# Patient Record
Sex: Female | Born: 1935 | Race: White | Hispanic: No | Smoking: Never smoker
Health system: Southern US, Community
[De-identification: ages and names within clinical notes are randomized; demographics above are authoritative.]

## PROBLEM LIST (undated history)

## (undated) DIAGNOSIS — K219 Gastro-esophageal reflux disease without esophagitis: Secondary | ICD-10-CM

## (undated) DIAGNOSIS — I10 Essential (primary) hypertension: Secondary | ICD-10-CM

## (undated) DIAGNOSIS — H35033 Hypertensive retinopathy, bilateral: Secondary | ICD-10-CM

## (undated) DIAGNOSIS — H35371 Puckering of macula, right eye: Secondary | ICD-10-CM

## (undated) DIAGNOSIS — E785 Hyperlipidemia, unspecified: Secondary | ICD-10-CM

## (undated) HISTORY — DX: Hypertensive retinopathy, bilateral: H35.033

## (undated) HISTORY — DX: Gastro-esophageal reflux disease without esophagitis: K21.9

## (undated) HISTORY — DX: Essential (primary) hypertension: I10

## (undated) HISTORY — DX: Puckering of macula, right eye: H35.371

## (undated) HISTORY — PX: TONSILLECTOMY: SUR1361

## (undated) HISTORY — DX: Hyperlipidemia, unspecified: E78.5

---

## 1979-02-04 HISTORY — PX: TOTAL ABDOMINAL HYSTERECTOMY: SHX209

## 1999-04-11 ENCOUNTER — Other Ambulatory Visit: Admission: RE | Admit: 1999-04-11 | Discharge: 1999-04-11 | Payer: Self-pay | Admitting: *Deleted

## 2000-02-04 DIAGNOSIS — K219 Gastro-esophageal reflux disease without esophagitis: Secondary | ICD-10-CM

## 2000-02-04 HISTORY — DX: Gastro-esophageal reflux disease without esophagitis: K21.9

## 2000-02-14 ENCOUNTER — Encounter (INDEPENDENT_AMBULATORY_CARE_PROVIDER_SITE_OTHER): Payer: Self-pay | Admitting: Specialist

## 2000-02-14 ENCOUNTER — Other Ambulatory Visit: Admission: RE | Admit: 2000-02-14 | Discharge: 2000-02-14 | Payer: Self-pay | Admitting: Gastroenterology

## 2000-07-04 DIAGNOSIS — E785 Hyperlipidemia, unspecified: Secondary | ICD-10-CM

## 2000-07-04 HISTORY — DX: Hyperlipidemia, unspecified: E78.5

## 2000-12-25 ENCOUNTER — Encounter: Payer: Self-pay | Admitting: *Deleted

## 2000-12-25 ENCOUNTER — Encounter: Admission: RE | Admit: 2000-12-25 | Discharge: 2000-12-25 | Payer: Self-pay | Admitting: *Deleted

## 2001-01-03 DIAGNOSIS — I10 Essential (primary) hypertension: Secondary | ICD-10-CM

## 2001-01-03 HISTORY — DX: Essential (primary) hypertension: I10

## 2002-05-03 ENCOUNTER — Other Ambulatory Visit: Admission: RE | Admit: 2002-05-03 | Discharge: 2002-05-03 | Payer: Self-pay | Admitting: *Deleted

## 2002-05-10 ENCOUNTER — Encounter: Admission: RE | Admit: 2002-05-10 | Discharge: 2002-05-10 | Payer: Self-pay | Admitting: *Deleted

## 2002-05-10 ENCOUNTER — Encounter: Payer: Self-pay | Admitting: *Deleted

## 2002-05-18 ENCOUNTER — Encounter: Payer: Self-pay | Admitting: Family Medicine

## 2002-11-18 ENCOUNTER — Encounter: Payer: Self-pay | Admitting: Family Medicine

## 2002-11-18 LAB — CONVERTED CEMR LAB: TSH: 3.53 microintl units/mL

## 2003-11-30 ENCOUNTER — Ambulatory Visit: Payer: Self-pay | Admitting: Family Medicine

## 2003-11-30 LAB — CONVERTED CEMR LAB
Blood Glucose, Fasting: 87 mg/dL
TSH: 3.21 microintl units/mL

## 2003-12-07 ENCOUNTER — Ambulatory Visit: Payer: Self-pay | Admitting: Family Medicine

## 2004-01-04 ENCOUNTER — Encounter: Admission: RE | Admit: 2004-01-04 | Discharge: 2004-01-04 | Payer: Self-pay | Admitting: Family Medicine

## 2004-08-08 ENCOUNTER — Ambulatory Visit: Payer: Self-pay | Admitting: Family Medicine

## 2004-09-12 ENCOUNTER — Ambulatory Visit: Payer: Self-pay | Admitting: Family Medicine

## 2004-10-17 ENCOUNTER — Ambulatory Visit: Payer: Self-pay | Admitting: Family Medicine

## 2004-10-17 LAB — CONVERTED CEMR LAB: Blood Glucose, Fasting: 90 mg/dL

## 2004-10-18 ENCOUNTER — Ambulatory Visit: Payer: Self-pay | Admitting: Family Medicine

## 2004-11-01 ENCOUNTER — Ambulatory Visit: Payer: Self-pay | Admitting: Family Medicine

## 2005-01-07 ENCOUNTER — Encounter: Admission: RE | Admit: 2005-01-07 | Discharge: 2005-01-07 | Payer: Self-pay | Admitting: Family Medicine

## 2005-03-03 ENCOUNTER — Ambulatory Visit: Payer: Self-pay | Admitting: Family Medicine

## 2005-03-07 ENCOUNTER — Ambulatory Visit: Payer: Self-pay | Admitting: Family Medicine

## 2005-03-07 LAB — CONVERTED CEMR LAB: Blood Glucose, Fasting: 93 mg/dL

## 2005-03-18 ENCOUNTER — Ambulatory Visit: Payer: Self-pay | Admitting: Family Medicine

## 2005-03-21 ENCOUNTER — Ambulatory Visit: Payer: Self-pay

## 2005-04-22 ENCOUNTER — Ambulatory Visit: Payer: Self-pay | Admitting: Family Medicine

## 2005-05-23 ENCOUNTER — Ambulatory Visit: Payer: Self-pay | Admitting: Family Medicine

## 2005-06-17 ENCOUNTER — Ambulatory Visit: Payer: Self-pay | Admitting: Family Medicine

## 2005-07-18 ENCOUNTER — Ambulatory Visit: Payer: Self-pay | Admitting: Family Medicine

## 2005-08-19 ENCOUNTER — Ambulatory Visit: Payer: Self-pay | Admitting: Family Medicine

## 2005-11-21 ENCOUNTER — Ambulatory Visit: Payer: Self-pay | Admitting: Family Medicine

## 2005-11-21 LAB — CONVERTED CEMR LAB: TSH: 2.91 microintl units/mL

## 2005-11-25 ENCOUNTER — Ambulatory Visit: Payer: Self-pay | Admitting: Family Medicine

## 2005-12-30 ENCOUNTER — Ambulatory Visit: Payer: Self-pay | Admitting: Family Medicine

## 2006-01-09 ENCOUNTER — Encounter: Admission: RE | Admit: 2006-01-09 | Discharge: 2006-01-09 | Payer: Self-pay | Admitting: Family Medicine

## 2006-05-28 ENCOUNTER — Ambulatory Visit: Payer: Self-pay | Admitting: Family Medicine

## 2006-09-02 ENCOUNTER — Encounter: Payer: Self-pay | Admitting: Family Medicine

## 2006-09-02 DIAGNOSIS — J309 Allergic rhinitis, unspecified: Secondary | ICD-10-CM | POA: Insufficient documentation

## 2006-09-02 DIAGNOSIS — E785 Hyperlipidemia, unspecified: Secondary | ICD-10-CM | POA: Insufficient documentation

## 2006-09-02 DIAGNOSIS — I10 Essential (primary) hypertension: Secondary | ICD-10-CM

## 2006-09-02 DIAGNOSIS — T7840XA Allergy, unspecified, initial encounter: Secondary | ICD-10-CM

## 2006-09-02 DIAGNOSIS — K219 Gastro-esophageal reflux disease without esophagitis: Secondary | ICD-10-CM | POA: Insufficient documentation

## 2006-09-02 DIAGNOSIS — N951 Menopausal and female climacteric states: Secondary | ICD-10-CM | POA: Insufficient documentation

## 2006-12-01 ENCOUNTER — Ambulatory Visit: Payer: Self-pay | Admitting: Family Medicine

## 2006-12-01 LAB — CONVERTED CEMR LAB
ALT: 21 units/L (ref 0–35)
AST: 20 units/L (ref 0–37)
Alkaline Phosphatase: 60 units/L (ref 39–117)
BUN: 12 mg/dL (ref 6–23)
Bilirubin, Direct: 0.1 mg/dL (ref 0.0–0.3)
CO2: 35 meq/L — ABNORMAL HIGH (ref 19–32)
Chloride: 103 meq/L (ref 96–112)
Cholesterol: 233 mg/dL (ref 0–200)
Direct LDL: 137.4 mg/dL
Potassium: 3.9 meq/L (ref 3.5–5.1)
Total Bilirubin: 0.9 mg/dL (ref 0.3–1.2)
Total Protein: 6.6 g/dL (ref 6.0–8.3)

## 2006-12-03 ENCOUNTER — Ambulatory Visit: Payer: Self-pay | Admitting: Family Medicine

## 2006-12-03 DIAGNOSIS — R7303 Prediabetes: Secondary | ICD-10-CM | POA: Insufficient documentation

## 2006-12-25 ENCOUNTER — Ambulatory Visit: Payer: Self-pay | Admitting: Family Medicine

## 2006-12-25 LAB — FECAL OCCULT BLOOD, GUAIAC: Fecal Occult Blood: NEGATIVE

## 2006-12-28 ENCOUNTER — Encounter (INDEPENDENT_AMBULATORY_CARE_PROVIDER_SITE_OTHER): Payer: Self-pay | Admitting: *Deleted

## 2007-01-12 ENCOUNTER — Encounter: Admission: RE | Admit: 2007-01-12 | Discharge: 2007-01-12 | Payer: Self-pay | Admitting: Family Medicine

## 2007-01-14 ENCOUNTER — Encounter (INDEPENDENT_AMBULATORY_CARE_PROVIDER_SITE_OTHER): Payer: Self-pay | Admitting: *Deleted

## 2007-05-18 ENCOUNTER — Ambulatory Visit: Payer: Self-pay | Admitting: Family Medicine

## 2007-06-22 ENCOUNTER — Encounter: Payer: Self-pay | Admitting: Family Medicine

## 2007-06-23 ENCOUNTER — Encounter: Payer: Self-pay | Admitting: Family Medicine

## 2007-08-19 ENCOUNTER — Ambulatory Visit: Payer: Self-pay | Admitting: Family Medicine

## 2007-11-23 ENCOUNTER — Telehealth: Payer: Self-pay | Admitting: Family Medicine

## 2007-12-09 DIAGNOSIS — M899 Disorder of bone, unspecified: Secondary | ICD-10-CM

## 2007-12-09 DIAGNOSIS — M949 Disorder of cartilage, unspecified: Secondary | ICD-10-CM

## 2007-12-10 ENCOUNTER — Ambulatory Visit: Payer: Self-pay | Admitting: Family Medicine

## 2007-12-12 LAB — CONVERTED CEMR LAB
AST: 20 units/L (ref 0–37)
Alkaline Phosphatase: 62 units/L (ref 39–117)
BUN: 12 mg/dL (ref 6–23)
Basophils Relative: 0.8 % (ref 0.0–3.0)
Bilirubin, Direct: 0.1 mg/dL (ref 0.0–0.3)
CO2: 31 meq/L (ref 19–32)
Cholesterol: 212 mg/dL (ref 0–200)
Direct LDL: 131.1 mg/dL
GFR calc Af Amer: 79 mL/min
GFR calc non Af Amer: 65 mL/min
Glucose, Bld: 91 mg/dL (ref 70–99)
HDL: 58.8 mg/dL (ref >39.0)
Hemoglobin: 13.9 g/dL (ref 12.0–15.0)
Lymphocytes Relative: 28.6 % (ref 12.0–46.0)
MCHC: 34 g/dL (ref 30.0–36.0)
Microalb Creat Ratio: 4.9 mg/g (ref 0.0–30.0)
Microalb, Ur: 0.4 mg/dL (ref 0.0–1.9)
Monocytes Absolute: 0.5 10*3/uL (ref 0.1–1.0)
Monocytes Relative: 8.2 % (ref 3.0–12.0)
Sodium: 140 meq/L (ref 135–145)
Total Protein: 6.9 g/dL (ref 6.0–8.3)
VLDL: 26 mg/dL (ref 0–40)
WBC: 6.1 10*3/uL (ref 4.5–10.5)

## 2007-12-14 ENCOUNTER — Ambulatory Visit: Payer: Self-pay | Admitting: Family Medicine

## 2008-01-13 ENCOUNTER — Encounter: Admission: RE | Admit: 2008-01-13 | Discharge: 2008-01-13 | Payer: Self-pay | Admitting: Family Medicine

## 2008-01-17 ENCOUNTER — Encounter (INDEPENDENT_AMBULATORY_CARE_PROVIDER_SITE_OTHER): Payer: Self-pay | Admitting: *Deleted

## 2008-03-09 ENCOUNTER — Telehealth: Payer: Self-pay | Admitting: Family Medicine

## 2008-04-17 ENCOUNTER — Ambulatory Visit: Payer: Self-pay | Admitting: Family Medicine

## 2008-04-25 ENCOUNTER — Ambulatory Visit: Payer: Self-pay | Admitting: Family Medicine

## 2008-05-08 ENCOUNTER — Telehealth: Payer: Self-pay | Admitting: Family Medicine

## 2008-05-25 ENCOUNTER — Ambulatory Visit: Payer: Self-pay | Admitting: Family Medicine

## 2008-05-30 ENCOUNTER — Ambulatory Visit: Payer: Self-pay | Admitting: Gastroenterology

## 2008-06-07 ENCOUNTER — Ambulatory Visit: Payer: Self-pay | Admitting: Gastroenterology

## 2008-06-22 ENCOUNTER — Ambulatory Visit: Payer: Self-pay | Admitting: Family Medicine

## 2008-06-22 LAB — CONVERTED CEMR LAB
Blood in Urine, dipstick: NEGATIVE
Ketones, urine, test strip: NEGATIVE
Specific Gravity, Urine: 1.01
Urobilinogen, UA: 0.2

## 2008-06-23 ENCOUNTER — Encounter: Payer: Self-pay | Admitting: Family Medicine

## 2008-08-14 ENCOUNTER — Telehealth: Payer: Self-pay | Admitting: Family Medicine

## 2008-10-12 ENCOUNTER — Telehealth (INDEPENDENT_AMBULATORY_CARE_PROVIDER_SITE_OTHER): Payer: Self-pay | Admitting: Internal Medicine

## 2008-12-14 ENCOUNTER — Ambulatory Visit: Payer: Self-pay | Admitting: Family Medicine

## 2008-12-14 LAB — CONVERTED CEMR LAB
ALT: 19 units/L (ref 0–35)
Albumin: 4.1 g/dL (ref 3.5–5.2)
BUN: 14 mg/dL (ref 6–23)
Basophils Absolute: 0.1 10*3/uL (ref 0.0–0.1)
Basophils Relative: 1 % (ref 0.0–3.0)
Bilirubin, Direct: 0 mg/dL (ref 0.0–0.3)
CO2: 32 meq/L (ref 19–32)
Creatinine,U: 77 mg/dL
Eosinophils Relative: 1 % (ref 0.0–5.0)
GFR calc non Af Amer: 64.7 mL/min (ref >60)
HCT: 40.2 % (ref 36.0–46.0)
HDL: 58.8 mg/dL (ref >39.00)
Lymphocytes Relative: 25.9 % (ref 12.0–46.0)
Lymphs Abs: 1.7 10*3/uL (ref 0.7–4.0)
MCV: 94.5 fL (ref 78.0–100.0)
Microalb Creat Ratio: 1.3 mg/g (ref 0.0–30.0)
Monocytes Absolute: 0.5 10*3/uL (ref 0.1–1.0)
Monocytes Relative: 7.3 % (ref 3.0–12.0)
Neutro Abs: 4 10*3/uL (ref 1.4–7.7)
Neutrophils Relative %: 64.8 % (ref 43.0–77.0)
Platelets: 204 10*3/uL (ref 150.0–400.0)
Sodium: 141 meq/L (ref 135–145)
TSH: 2.23 microintl units/mL (ref 0.35–5.50)
Total CHOL/HDL Ratio: 4
Triglycerides: 119 mg/dL (ref 0.0–149.0)
VLDL: 23.8 mg/dL (ref 0.0–40.0)

## 2008-12-19 ENCOUNTER — Ambulatory Visit: Payer: Self-pay | Admitting: Family Medicine

## 2009-01-03 ENCOUNTER — Encounter: Payer: Self-pay | Admitting: Family Medicine

## 2009-01-03 ENCOUNTER — Ambulatory Visit: Payer: Self-pay | Admitting: Internal Medicine

## 2009-01-16 ENCOUNTER — Encounter: Admission: RE | Admit: 2009-01-16 | Discharge: 2009-01-16 | Payer: Self-pay | Admitting: Family Medicine

## 2009-02-12 ENCOUNTER — Telehealth: Payer: Self-pay | Admitting: Family Medicine

## 2009-03-20 ENCOUNTER — Ambulatory Visit: Payer: Self-pay | Admitting: Family Medicine

## 2009-03-20 LAB — CONVERTED CEMR LAB
BUN: 10 mg/dL (ref 6–23)
GFR calc non Af Amer: 64.66 mL/min (ref >60)
Glucose, Bld: 103 mg/dL — ABNORMAL HIGH (ref 70–99)
Sodium: 140 meq/L (ref 135–145)

## 2009-04-04 ENCOUNTER — Ambulatory Visit: Payer: Self-pay | Admitting: Family Medicine

## 2009-04-25 ENCOUNTER — Ambulatory Visit: Payer: Self-pay | Admitting: Family Medicine

## 2009-05-01 ENCOUNTER — Telehealth: Payer: Self-pay | Admitting: Family Medicine

## 2009-06-29 ENCOUNTER — Encounter: Payer: Self-pay | Admitting: Family Medicine

## 2009-09-11 ENCOUNTER — Encounter (INDEPENDENT_AMBULATORY_CARE_PROVIDER_SITE_OTHER): Payer: Self-pay | Admitting: *Deleted

## 2009-11-14 ENCOUNTER — Telehealth (INDEPENDENT_AMBULATORY_CARE_PROVIDER_SITE_OTHER): Payer: Self-pay | Admitting: *Deleted

## 2009-11-15 ENCOUNTER — Ambulatory Visit: Payer: Self-pay | Admitting: Family Medicine

## 2009-11-15 LAB — CONVERTED CEMR LAB
AST: 23 units/L (ref 0–37)
Albumin: 4 g/dL (ref 3.5–5.2)
Alkaline Phosphatase: 65 units/L (ref 39–117)
BUN: 15 mg/dL (ref 6–23)
Bilirubin, Direct: 0.1 mg/dL (ref 0.0–0.3)
CO2: 31 meq/L (ref 19–32)
Calcium: 9.3 mg/dL (ref 8.4–10.5)
Cholesterol: 218 mg/dL — ABNORMAL HIGH (ref 0–200)
Creatinine, Ser: 0.9 mg/dL (ref 0.4–1.2)
Direct LDL: 129.1 mg/dL
Glucose, Bld: 108 mg/dL — ABNORMAL HIGH (ref 70–99)
Microalb Creat Ratio: 0.3 mg/g (ref 0.0–30.0)
Potassium: 3.7 meq/L (ref 3.5–5.1)
Total Bilirubin: 0.6 mg/dL (ref 0.3–1.2)
Triglycerides: 126 mg/dL (ref 0.0–149.0)

## 2009-12-13 ENCOUNTER — Ambulatory Visit: Payer: Self-pay | Admitting: Family Medicine

## 2010-01-17 ENCOUNTER — Encounter
Admission: RE | Admit: 2010-01-17 | Discharge: 2010-01-17 | Payer: Self-pay | Source: Home / Self Care | Attending: Family Medicine | Admitting: Family Medicine

## 2010-01-17 LAB — HM MAMMOGRAPHY: HM Mammogram: NORMAL

## 2010-03-05 NOTE — Progress Notes (Signed)
Summary: ? sinus infection  Phone Note Call from Patient Call back at Home Phone [phone]   Caller: Patient Call For: Dr. Dayton Martes Summary of Call: Pt was seen last week for a sore throat and given pcn.  Now she thinks she has a sinus infection.  Her throat is better but now she has productive cough with yellow mucous, sinus congestion.  No fever.  She thinks she needs a stronger abx called to [facility] [address].  Please advise. Initial call taken by: Lowella Petties CMA,  May 01, 2009 11:05 AM  Follow-up for Phone Call        The penicillin should be enough.  It will take time for the mucous to resolve.  Call back if no improvement in 5 days. Follow-up by: Ruthe Mannan MD,  May 01, 2009 11:06 AM  Additional Follow-up for Phone Call Additional follow up Details #1::        Patient Advised.  Additional Follow-up by: Delilah Shan CMA (AAMA),  May 01, 2009 11:12 AM

## 2010-03-05 NOTE — Assessment & Plan Note (Signed)
Summary: SORE THROAT   Vital Signs:  Patient profile:   77 year old female Height:      66 inches Weight:      161.25 pounds BMI:     26.12 Temp:     98.8 degrees F oral Pulse rate:   76 / minute Pulse rhythm:   regular BP sitting:   142 / 74  (left arm) Cuff size:   regular  Vitals Entered By: Delilah Shan CMA Duncan Dull) (April 25, 2009 12:24 PM) CC: ST.  Needs Rx. for Ambien and HCTZ   History of Present Illness: 77 yo here for two days of sore throat. Fever, Tmax 101. No known sick contacts. No cough, runny nose, congestion, or other symptoms. No difficulty swallowing but does have pain with swallowing.  Current Medications (verified): 1)  Atenolol 50 Mg  Tabs (Atenolol) .Marland Kitchen.. 1 Tablet By Mouth Two Times A Day 2)  Nexium 40 Mg  Cpdr (Esomeprazole Magnesium) .... As Needed 3)  Hydrochlorothiazide 12.5 Mg  Tabs (Hydrochlorothiazide) .Marland Kitchen.. 1 Once Daily 4)  Cozaar 50 Mg Tabs (Losartan Potassium) .... One Tab By Mouth Once Daily 5)  Ambien 10 Mg Tabs (Zolpidem Tartrate) .Marland Kitchen.. 1 At Bedtime If Needed 6)  Penicillin V Potassium 500 Mg Tabs (Penicillin V Potassium) .Marland Kitchen.. 1 Tab By Mouth Three Times A Day X 10 Days  Allergies: 1)  ! Codeine Sulfate (Codeine Sulfate)  Review of Systems      See HPI General:  Complains of chills and fever. ENT:  Complains of sore throat; denies ear discharge, earache, hoarseness, nasal congestion, nosebleeds, and postnasal drainage. Resp:  Denies cough. GI:  Denies diarrhea, nausea, and vomiting.  Physical Exam  General:  Well-developed,well-nourished,in no acute distress; alert,appropriate and cooperative throughout examination Mouth:  pharyngeal erythema, tonsil hypertropied, and white plaque(s).   Lungs:  Normal respiratory effort, chest expands symmetrically. Lungs are clear to auscultation, no crackles or wheezes. Heart:  Normal rate and regular rhythm. S1 and S2 normal without gallop, murmur, click, rub or other extra sounds. Cervical Nodes:   L anterior LN tender.   Psych:  Cognition and judgment appear intact. Alert and cooperative with normal attention span and concentration. No apparent delusions, illusions, hallucinations   Impression & Recommendations:  Problem # 1:  ACUTE PHARYNGITIS (ICD-462) Assessment New Rapid strep negative, however has all cardinal symptoms of strep throat on history and physical exam. Given that there is a possiblity of a false negative, will treat with PCN for cardiac protection. Pt in agreement with plan. Her updated medication list for this problem includes:    Penicillin V Potassium 500 Mg Tabs (Penicillin v potassium) .Marland Kitchen... 1 tab by mouth three times a day x 10 days  Orders: Rapid Strep ([address])  Complete Medication List: 1)  Atenolol 50 Mg Tabs (Atenolol) .Marland Kitchen.. 1 tablet by mouth two times a day 2)  Nexium 40 Mg Cpdr (Esomeprazole magnesium) .... As needed 3)  Hydrochlorothiazide 12.5 Mg Tabs (Hydrochlorothiazide) .Marland Kitchen.. 1 once daily 4)  Cozaar 50 Mg Tabs (Losartan potassium) .... One tab by mouth once daily 5)  Ambien 10 Mg Tabs (Zolpidem tartrate) .Marland Kitchen.. 1 at bedtime if needed 6)  Penicillin V Potassium 500 Mg Tabs (Penicillin v potassium) .Marland Kitchen.. 1 tab by mouth three times a day x 10 days Prescriptions: PENICILLIN V POTASSIUM 500 MG TABS (PENICILLIN V POTASSIUM) 1 tab by mouth three times a day x 10 days  #30 x 0   Entered and Authorized by:   Ruthe Mannan  MD   Signed by:   Ruthe Mannan MD on 04/25/2009   Method used:   Print then Give to Patient   RxID:   [id number] HYDROCHLOROTHIAZIDE 12.5 MG  TABS (HYDROCHLOROTHIAZIDE) 1 once daily  #30 Each x 11   Entered and Authorized by:   Ruthe Mannan MD   Signed by:   Ruthe Mannan MD on 04/25/2009   Method used:   Print then Give to Patient   RxID:   [id number] AMBIEN 10 MG TABS (ZOLPIDEM TARTRATE) 1 at bedtime if needed  #30 x 1   Entered and Authorized by:   Ruthe Mannan MD   Signed by:   Ruthe Mannan MD on 04/25/2009   Method used:   Print  then Give to Patient   RxID:   [id number]   Current Allergies (reviewed today): ! CODEINE SULFATE (CODEINE SULFATE)  Laboratory Results   Date/Time Reported: April 25, 2009 12:49 PM   Other Tests  Rapid Strep: negative

## 2010-03-05 NOTE — Assessment & Plan Note (Signed)
Summary: CPX/DLO   Vital Signs:  Patient profile:   77 year old female Weight:      162.50 pounds Temp:     98.1 degrees F oral Pulse rate:   68 / minute Pulse rhythm:   regular BP sitting:   126 / 76  (left arm) Cuff size:   regular  Vitals Entered By: Sydell Axon LPN (January 11, 2010 1:47 PM) CC: 30 Minute checkup, has had a hysterecomy, had a colonoscopy 05/10 by Dr. Christella Hartigan  Vision Screening:Left eye with correction: 20 / 25 Both eyes with correction: 20 / 25        Vision Entered By: Sydell Axon LPN (2010/01/11 2:02 PM) 25db HL: Left  500 hz: 25db 1000 hz: 25db 2000 hz: 25db 4000 hz: No Response Right  500 hz: 25db 1000 hz: 25db 2000 hz: 25db 4000 hz: No Response    History of Present Illness: Pt here for Comp Exam. She has no complaints and feels well.   Preventive Screening-Counseling & Management  Alcohol-Tobacco     Alcohol drinks/day: 1     Alcohol type: wine with dinner     Smoking Status: never     Passive Smoke Exposure: no  Caffeine-Diet-Exercise     Caffeine use/day: 4     Does Patient Exercise: yes     Type of exercise: weights     Times/week: 4  Problems Prior to Update: 1)  Health Screening  (ICD-V70.0) 2)  Osteopenia  (ICD-733.90) 3)  Special Screening Malig Neoplasms Other Sites  (ICD-V76.49) 4)  Carbohydrate Metabolism Disorder  (ICD-271.9) 5)  Screening Mammogram Nec  (ICD-V76.12) 6)  Allergy  (ICD-995.3) 7)  Postmenopausal Status  (ICD-627.2) 8)  Hypertension  (ICD-401.9) 9)  Hyperlipidemia  (ICD-272.4) 10)  Gerd  (ICD-530.81)  Medications Prior to Update: 1)  Atenolol 50 Mg  Tabs (Atenolol) .Marland Kitchen.. 1 Tablet By Mouth Two Times A Day 2)  Nexium 40 Mg  Cpdr (Esomeprazole Magnesium) .... As Needed 3)  Hydrochlorothiazide 12.5 Mg  Tabs (Hydrochlorothiazide) .Marland Kitchen.. 1 Once Daily 4)  Cozaar 50 Mg Tabs (Losartan Potassium) .... One Tab By Mouth Once Daily 5)  Ambien 10 Mg Tabs (Zolpidem Tartrate) .Marland Kitchen.. 1 At Bedtime If  Needed  Current Medications (verified): 1)  Atenolol 50 Mg  Tabs (Atenolol) .Marland Kitchen.. 1 Tablet By Mouth Two Times A Day 2)  Nexium 40 Mg  Cpdr (Esomeprazole Magnesium) .... As Needed 3)  Hydrochlorothiazide 12.5 Mg  Tabs (Hydrochlorothiazide) .Marland Kitchen.. 1 Once Daily 4)  Cozaar 50 Mg Tabs (Losartan Potassium) .... One Tab By Mouth Once Daily 5)  Ambien 10 Mg Tabs (Zolpidem Tartrate) .Marland Kitchen.. 1 At Bedtime If Needed 6)  Multivitamins  Tabs (Multiple Vitamin) .... Take One By Mouth Daily 7)  Vitamin D 1000 Unit Tabs (Cholecalciferol) .... Take One By Mouth Daily 8)  Vitamin-B Complex  Tabs (B Complex Vitamins) .... Take One By Mouth Daily  Allergies: 1)  ! Codeine Sulfate (Codeine Sulfate)  Past History:  Past Medical History: Last updated: 09/02/2006 GERD: 02/2000 Hyperlipidemia: 07/2000 Hypertension: 01/2001  Family History: Last updated: January 11, 2010 Father: DECEASED 27 YOA CVD/ MI , DIALYSIS , HTN  Mother:  A 50 Broken Hip  HTN, SHINGLES BROTHER A 72  LUNG CANCER ( SMOKER)// ANEURYSM OF AORTA X 2 CV: + FATHER / MI HBP: + SELF, + MOTHER, + FATHER DM: NEGATIVE GOUT/ARTHRITIS: PROSTATE CANCER: + BROTHER LUNG (SMOKER) BREAST/OVARIAN/UTERINE CANCER: + 3 M- AUNTS COLON CANCER:  DEPRESSION: NEGATIVE ETOH/DRUG ABUSE: NEGATIVE OTHER: NEGATIVE  STROKE  Social History: Last updated: 09/02/2006 Marital Status: Married/ REMARRIED X 7 YEARS Children: 1 DAUGHTER Occupation: STUDIO// ARTIST/ TEACHES  Risk Factors: Alcohol Use: 1 (12/13/2009) Caffeine Use: 4 (12/13/2009) Exercise: yes (12/13/2009)  Risk Factors: Smoking Status: never (12/13/2009) Passive Smoke Exposure: no (12/13/2009)  Past Surgical History: TAH  w/ BSO for fibroids (Dr Luciana Axe) 1981 Colonoscopy Ext Hemms  Past Polyps none today (Dr Christella Hartigan) 06/07/2008      5 yrs. DEXA nml 01/03/2009  Family History: Father: DECEASED 40 YOA CVD/ MI , DIALYSIS , HTN  Mother:  A 70 Broken Hip  HTN, SHINGLES BROTHER A 72  LUNG CANCER ( SMOKER)//  ANEURYSM OF AORTA X 2 CV: + FATHER / MI HBP: + SELF, + MOTHER, + FATHER DM: NEGATIVE GOUT/ARTHRITIS: PROSTATE CANCER: + BROTHER LUNG (SMOKER) BREAST/OVARIAN/UTERINE CANCER: + 3 M- AUNTS COLON CANCER:  DEPRESSION: NEGATIVE ETOH/DRUG ABUSE: NEGATIVE OTHER: NEGATIVE STROKE  Review of Systems General:  Denies chills, fatigue, fever, sweats, weakness, and weight loss. Eyes:  Denies blurring, discharge, and eye pain; wears conmtacts, near one, distant the other. ENT:  Denies decreased hearing, ear discharge, earache, and ringing in ears. CV:  Denies chest pain or discomfort, fainting, fatigue, palpitations, shortness of breath with exertion, swelling of feet, and swelling of hands. Resp:  Denies cough, shortness of breath, and wheezing. GI:  Denies abdominal pain, bloody stools, change in bowel habits, constipation, dark tarry stools, diarrhea, indigestion, loss of appetite, nausea, vomiting, vomiting blood, and yellowish skin color. GU:  Denies discharge, dysuria, and nocturia. MS:  Denies joint pain, low back pain, muscle aches, cramps, and stiffness. Derm:  Denies dryness, itching, and rash. Neuro:  Denies numbness, poor balance, tingling, and tremors.  Physical Exam  General:  Well-developed,well-nourished,in no acute distress; alert,appropriate and cooperative throughout examination Head:  Normocephalic and atraumatic without obvious abnormalities. No apparent alopecia or balding. Eyes:  Conjunctiva clear bilaterally.  Ears:  External ear exam shows no significant lesions or deformities.  Otoscopic examination reveals clear canals, tympanic membranes are intact bilaterally without bulging, retraction, inflammation or discharge. Hearing is grossly normal bilaterally. Nose:  External nasal examination shows no deformity or inflammation. Nasal mucosa are pink and moist without lesions or exudates. Imnimal irritation and inflammation. Mouth:  pharyngeal erythema, tonsil hypertropied, and  white plaque(s).   Neck:  No deformities, masses, or tenderness noted. Chest Wall:  No deformities, masses, or tenderness noted. Breasts:  No mass, nodules, thickening, tenderness, bulging, retraction, inflamation, nipple discharge or skin changes noted.   Lungs:  Normal respiratory effort, chest expands symmetrically. Lungs are clear to auscultation, no crackles or wheezes. Heart:  Normal rate and regular rhythm. S1 and S2 normal without gallop, murmur, click, rub or other extra sounds. Abdomen:  Bowel sounds positive,abdomen soft and non-tender without masses, organomegaly or hernias noted. Rectal:  No external abnormalities noted. Normal sphincter tone. No rectal masses or tenderness. G neg. Genitalia:  Bimanual only done Introitus wnl, Uterus and Cervix absent, Adnexa nontender w/o mass, ovaries not felt.  Msk:  No deformity or scoliosis noted of thoracic or lumbar spine.   Pulses:  R and L carotid,radial,femoral,dorsalis pedis and posterior tibial pulses are full and equal bilaterally Extremities:  No clubbing, cyanosis, edema, or deformity noted with normal full range of motion of all joints.   Neurologic:  No cranial nerve deficits noted. Station and gait are normal. Sensory, motor and coordinative functions appear intact. Skin:  Intact without suspicious lesions or rashes Cervical Nodes:  No lymphadenopathy noted  Axillary Nodes:  No palpable lymphadenopathy Inguinal Nodes:  No significant adenopathy Psych:  Cognition and judgment appear intact. Alert and cooperative with normal attention span and concentration. No apparent delusions, illusions, hallucinations   Impression & Recommendations:  Problem # 1:  HEALTH SCREENING (ICD-V70.0)  I have personally reviewed the Medicare Annual Wellness questionnaire and have noted 1.   The patient's medical and social history 2.   Their use of alcohol, tobacco or illicit drugs 3.   Their current medications and supplements 4.   The patient's  functional ability including ADL's, fall risks, home safety risks and hearing or visual             impairment. 5.   Diet and physical activities 6.   Evidence for depression or mood disorders  Orders: Medicare -1st Annual Wellness Visit [phone])  Problem # 2:  OSTEOPENIA (ICD-733.90) Assessment: Unchanged  Stable via DEXA 12/10 which was nml. Cont Vit D repl. Her updated medication list for this problem includes:    Vitamin D 1000 Unit Tabs (Cholecalciferol) .Marland Kitchen... 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  Problem # 4:  HYPERTENSION (ICD-401.9) Assessment: Improved  Stable, cont curr meds. Her updated medication list for this problem includes:    Atenolol 50 Mg Tabs (Atenolol) .Marland Kitchen... 1 tablet by mouth two times a day    Hydrochlorothiazide 12.5 Mg Tabs (Hydrochlorothiazide) .Marland Kitchen... 1 once daily    Cozaar 50 Mg Tabs (Losartan potassium) ..... One tab by mouth once daily  BP today: 126/76 Prior BP: 142/74 (04/25/2009)  Labs Reviewed: K+: 3.7 (11/15/2009) Creat: : 0.9 (11/15/2009)   Chol: 218 (11/15/2009)   HDL: 66.60 (11/15/2009)   LDL: DEL (12/10/2007)   TG: 126.0 (11/15/2009)  Orders: Prescription Created Electronically [phone])  Problem # 5:  HYPERLIPIDEMIA (ICD-272.4) Assessment: Unchanged Adequate.Try to get LDL lower via diet as discussed. Labs Reviewed: SGOT: 23 (11/15/2009)   SGPT: 25 (11/15/2009)   HDL:66.60 (11/15/2009), 58.80 (12/14/2008)  LDL:DEL (12/10/2007), DEL (12/01/2006)  Chol:218 (11/15/2009), 224 (12/14/2008)  Trig:126.0 (11/15/2009), 119.0 (12/14/2008)  Complete Medication List: 1)  Atenolol 50 Mg Tabs (Atenolol) .Marland Kitchen.. 1 tablet by mouth two times a day 2)  Nexium 40 Mg Cpdr (Esomeprazole magnesium) .... As needed 3)  Hydrochlorothiazide 12.5 Mg Tabs (Hydrochlorothiazide) .Marland Kitchen.. 1 once daily 4)  Cozaar  50 Mg Tabs (Losartan potassium) .... One tab by mouth once daily 5)  Ambien 10 Mg Tabs (Zolpidem tartrate) .Marland Kitchen.. 1 at bedtime if needed 6)  Multivitamins Tabs (Multiple vitamin) .... Take one by mouth daily 7)  Vitamin D 1000 Unit Tabs (Cholecalciferol) .... Take one by mouth daily 8)  Vitamin-b Complex Tabs (B complex vitamins) .... Take one by mouth daily  Other Orders: Audiometry [phone]) Vision Screening (91478) Zoster (Shingles) Vaccine Live [phone]) Admin 1st Vaccine (13086)  Patient Instructions: 1)  Zostavax today. 2)  RTC one year, sooner as needed. Prescriptions: COZAAR 50 MG TABS (LOSARTAN POTASSIUM) one tab by mouth once daily  #90 x 3   Entered and Authorized by:   Shaune Leeks MD   Signed by:   Shaune Leeks MD on 12/13/2009   Method used:   Electronically to        [facility] (retail)       [address]       [place]  [place], [place]  [address]       Ph: [phone]       Fax: [phone]   RxID:   [id number] HYDROCHLOROTHIAZIDE 12.5 MG  TABS (HYDROCHLOROTHIAZIDE) 1 once daily  #90 x 3   Entered and Authorized by:   Shaune Leeks MD   Signed by:   Shaune Leeks MD on 12/13/2009   Method used:   Electronically to        [facility] (retail)       [address]       [place], [place]  [address]       Ph: [phone]       Fax: [phone]   RxID:   [id number] ATENOLOL 50 MG  TABS (ATENOLOL) 1 tablet by mouth two times a day  #180 x 3   Entered and Authorized by:   Shaune Leeks MD   Signed by:   Shaune Leeks MD on 12/13/2009   Method used:   Electronically to        [facility] (retail)       [address]       [place], [place]  [address]       Ph: [phone]       Fax: [phone]   RxID:   [id number] HYDROCHLOROTHIAZIDE 12.5 MG  TABS (HYDROCHLOROTHIAZIDE) 1 once daily  #30 Each x 12   Entered and Authorized  by:   Shaune Leeks MD   Signed by:   Shaune Leeks MD on 12/13/2009   Method used:   Electronically to        [facility] (retail)       [address]       [place], [place]  [address]       Ph: [phone]       Fax: [phone]   RxID:   [id number] ATENOLOL 50 MG  TABS (ATENOLOL) 1 tablet by mouth two times a day  #60 Each x 12   Entered and Authorized by:   Shaune Leeks MD   Signed by:   Shaune Leeks MD on 12/13/2009   Method used:   Electronically to        [facility] (retail)       [address]       [place], [place]  [address]       Ph: [phone]       Fax: [phone]   RxID:   [id number] COZAAR 50 MG TABS (LOSARTAN POTASSIUM) one tab by mouth once daily  #30 x 12   Entered and Authorized by:   Shaune Leeks MD   Signed by:   Shaune Leeks MD on 12/13/2009   Method used:   Electronically to        [facility] (retail)       [address]       [place], [place]  [address]       Ph: [phone]       Fax: [phone]   RxID:   [id number]    Orders Added: 1)  Audiometry [92552] 2)  Vision Screening [97353] 3)  Prescription Created Electronically [G8553] 4)  Medicare -  1st Annual Wellness Visit [G0438] 5)  Zoster (Shingles) Vaccine Live [90736] 6)  Admin 1st Vaccine [90471]   Immunizations Administered:  Zostavax # 1:    Vaccine Type: Zostavax    Site: left deltoid    Mfr: Merck    Dose: 0.5 ml    Route: [place]    Given by: Sydell Axon LPN    Exp. Date: 09/21/2010    Lot #: [id number]    VIS given: 11/15/04 given December 13, 2009.   Immunizations Administered:  Zostavax # 1:    Vaccine Type: Zostavax    Site: left deltoid    Mfr: Merck    Dose: 0.5 ml    Route: [place]    Given by: Sydell Axon LPN    Exp. Date: 09/21/2010    Lot #: [id number]    VIS given: 11/15/04 given December 13, 2009.  Current  Allergies (reviewed today): ! CODEINE SULFATE (CODEINE SULFATE)

## 2010-03-05 NOTE — Consult Note (Signed)
Summary: Dr.Kathryn [name],Ophthalmology,Note  Dr.Kathryn [name],Ophthalmology,Note   Imported By: Beau Fanny 07/06/2009 13:42:55  _____________________________________________________________________  External Attachment:    Type:   Image     Comment:   External Document

## 2010-03-05 NOTE — Letter (Signed)
Summary: Nadara Eaton letter  [facility]  [address] [place], [place] [address]   Phone: [phone]  Fax: [phone]       09/11/2009 MRN: [id number]  [facility] [address] [place], [place]  [address]  Dear Ms. [facility] [place], and [place] announce the retirement of Arta Silence, M.D., from full-time practice at the [facility] office effective August 02, 2009 and his plans of returning part-time.  It is important to Dr. Hetty Ely and to our practice that you understand that [facility] has seven physicians in our office for your health care needs.  We will continue to offer the same exceptional care that you have today.    Dr. Hetty Ely has spoken to many of you about his plans for retirement and returning part-time in the fall.   We will continue to work with you through the transition to schedule appointments for you in the office and meet the high standards that [facility] is committed to.   Again, it is with great pleasure that we share the news that Dr. Hetty Ely will return to [facility] in October of 2011 with a reduced schedule.    If you have any questions, or would like to request an appointment with one of our physicians, please call us at [phone] and press the option for Scheduling an appointment.  We take pleasure in providing you with excellent patient care and look forward to seeing you at your next office visit.  Our [facility] Physicians are:  Tillman Abide, M.D. Laurita Quint, M.D. Roxy Manns, M.D. Kerby Nora, M.D. Hannah Beat, M.D. Ruthe Mannan, M.D. We proudly welcomed Raechel Ache, M.D. and Eustaquio Boyden, M.D. to the practice in July/August 2011.  Sincerely,   [facility]

## 2010-03-05 NOTE — Progress Notes (Signed)
----   Converted from flag ---- ---- 11/14/2009 1:22 PM, Shaune Leeks MD wrote: bmet 401.9 hepatic chol prof tsh 272.4 microalb 271.9 vit d 733.90  ---- 11/14/2009 12:15 PM, Mills Koller wrote: This patient is scheduled for CPX with you, I need lab orders with dx, please. Thanks, Terri ------------------------------

## 2010-03-05 NOTE — Progress Notes (Signed)
Summary: Rx Atenolol  Phone Note Refill Request Call back at [phone] Message from:  [facility] on February 12, 2009 11:58 AM  Refills Requested: Medication #1:  ATENOLOL 50 MG  TABS 1 tablet by mouth two times a day   Last Refilled: 01/10/2009 Received a call from the pharmacy regarding patient's Atenolol.  Said they have faxed refill request form to our office twice and received no response.  Gave verbal authorization to refill medication.  #60 with 6 refills.   Method Requested: Telephone to Pharmacy Initial call taken by: Linde Gillis CMA Duncan Dull),  February 12, 2009 11:59 AM

## 2010-03-05 NOTE — Assessment & Plan Note (Signed)
Summary: 3 month follow up bp check/rbh   Vital Signs:  Patient profile:   77 year old female Weight:      164.50 pounds BMI:     26.65 Temp:     97.6 degrees F oral Pulse rate:   64 / minute Pulse rhythm:   regular BP sitting:   132 / 78  (left arm) Cuff size:   regular  Vitals Entered By: Sydell Axon LPN (April 04, 452 10:06 AM) CC: 3 Month follow-up after labs and on BP   History of Present Illness: Pt here for followup of BP after switching from Micardis to generic Cozaar, tolerating well. On ly complaint today is congestion but tolerating.  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 1 Tablet By Mouth Two Times A Day 2)  Nexium 40 Mg  Cpdr (Esomeprazole Magnesium) .... As Needed 3)  Hydrochlorothiazide 12.5 Mg  Tabs (Hydrochlorothiazide) .Marland Kitchen.. 1 Once Daily 4)  Cozaar 50 Mg Tabs (Losartan Potassium) .... One Tab By Mouth Once Daily 5)  Ambien 10 Mg Tabs (Zolpidem Tartrate) .Marland Kitchen.. 1 At Bedtime If Needed  Allergies: 1)  ! Codeine Sulfate (Codeine Sulfate)  Physical Exam  General:  Well-developed,well-nourished,in no acute distress; alert,appropriate and cooperative throughout examination, healthy.. Head:  Normocephalic and atraumatic without obvious abnormalities. No apparent alopecia or balding. Eyes:  Conjunctiva clear bilaterally.  Ears:  External ear exam shows no significant lesions or deformities.  Otoscopic examination reveals clear canals, tympanic membranes are intact bilaterally without bulging, retraction, inflammation or  discharge. Hearing is grossly normal bilaterally. Nose:  External nasal examination shows no deformity or inflammation. Nasal mucosa are pink and moist without lesions or exudates. Imnimal irritation and inflammation. Mouth:  Oral mucosa and oropharynx without lesions or exudates.  Teeth in good repair. Neck:  No deformities, masses, or tenderness noted. Lungs:  Normal respiratory effort, chest expands symmetrically. Lungs are clear to auscultation, no crackles or wheezes. Heart:  Normal rate and regular rhythm. S1 and S2 normal without gallop, murmur, click, rub or other extra sounds.   Impression & Recommendations:  Problem # 1:  HYPERTENSION (ICD-401.9) Assessment Unchanged Adequately controlled on new medication, BUN/Cr and K fine.  Continue. Her updated medication list for this problem includes:    Atenolol 50 Mg Tabs (Atenolol) .Marland Kitchen... 1 tablet by mouth two times a day    Hydrochlorothiazide 12.5 Mg Tabs (Hydrochlorothiazide) .Marland Kitchen... 1 once daily    Cozaar 50 Mg Tabs (Losartan potassium) ..... One tab by mouth once daily  BP today: 132/78 Prior BP: 118/64 (12/19/2008)  Labs Reviewed: K+: 3.8 (03/20/2009) Creat: : 0.9 (03/20/2009)   Chol: 224 (12/14/2008)   HDL: 58.80 (12/14/2008)   LDL: DEL (12/10/2007)   TG: 119.0 (12/14/2008)  Problem # 2:  URI (ICD-465.9) Assessment: New  Tolerating.  Take Guaifenesin by [facility] or [facility] and getting MUCOUS RELIEF EXPECTORANT (400mg ), take 11/2 tabs by mouth AM and NOON. Drink lots of fluids anytime taking Guaifenesin.   Instructed on symptomatic treatment. Call if symptoms persist or worsen.   Complete  Medication List: 1)  Atenolol 50 Mg Tabs (Atenolol) .Marland Kitchen.. 1 tablet by mouth two times a day 2)  Nexium 40 Mg Cpdr (Esomeprazole magnesium) .... As needed 3)  Hydrochlorothiazide 12.5 Mg Tabs (Hydrochlorothiazide) .Marland Kitchen.. 1 once daily 4)  Cozaar 50 Mg Tabs (Losartan potassium) .... One tab by mouth once daily 5)   Ambien 10 Mg Tabs (Zolpidem tartrate) .Marland Kitchen.. 1 at bedtime if needed  Patient Instructions: 1)  ERTC for regular Comp Exam.  Current Allergies (reviewed today): ! CODEINE SULFATE (CODEINE SULFATE)

## 2010-10-29 ENCOUNTER — Telehealth: Payer: Self-pay | Admitting: *Deleted

## 2010-10-29 NOTE — Telephone Encounter (Signed)
Pt has found a lump in right breast.  She usually gets her screening mammogram in December but wants to go for diagnostic as soon as possible.  She goes to the breast center in [place].

## 2010-10-29 NOTE — Telephone Encounter (Signed)
Please hve her come in to be seen. Will order mammo then.

## 2010-10-29 NOTE — Telephone Encounter (Signed)
Left message at home number to call back. 

## 2010-10-30 NOTE — Telephone Encounter (Signed)
Patient notified as instructed by telephone. Appointment scheduled. 

## 2010-10-31 ENCOUNTER — Ambulatory Visit: Payer: Self-pay | Admitting: Family Medicine

## 2010-10-31 ENCOUNTER — Encounter: Payer: Self-pay | Admitting: Family Medicine

## 2010-10-31 ENCOUNTER — Ambulatory Visit (INDEPENDENT_AMBULATORY_CARE_PROVIDER_SITE_OTHER): Payer: Medicare Other | Admitting: Family Medicine

## 2010-10-31 DIAGNOSIS — N63 Unspecified lump in unspecified breast: Secondary | ICD-10-CM

## 2010-10-31 NOTE — Patient Instructions (Signed)
Do breast exam in one month and let me know either way. If feels mass or does not. If mass felt, will refer for diagnostic mammogram. If not felt, will get usual screening mammo in Dec in usual yearly follow up.

## 2010-10-31 NOTE — Progress Notes (Signed)
  Subjective:    Patient ID: BRINDLEY MADARANG, female    DOB: 06/20/1930, 77 y.o.   MRN: [id number]  HPI Pt is a 77 yo WF who found a lump in her breast a few days ago and wanted a diagnostic mammogram. I wanted to see her prior to ordering. She last had a mammogram in Dec and she does self exams when she thinks about it. Her right arm was sore last week and she was checking the area in the shower and thought she felt a small pea sized lump up in the tail of the breast in the upper lateral quadrant of the right breast. She felt no lymph node swelling in the axillary area and the arm is still a little sore today. She lifts weights regularly, had not increased amount of weight or number of reps recently but has backed off some the last few days and the discomfort has improved. She does not feel the lump today.    Review of SystemsNoncontributory except as above.       Objective:   Physical Exam  Constitutional: She appears well-developed and well-nourished. No distress.  Pulmonary/Chest:       Breast exam benign except for some tissue thickening bilat in the respective upper outer quadrants. No discrete masses felt.          Assessment & Plan:

## 2010-10-31 NOTE — Assessment & Plan Note (Signed)
No mass or lump felt today.  Will have her do self exam in one month to reassess. If still finds mass, will get diagnostic mammogram then. If not, will get routine screening mammo in Dec on schedule.

## 2010-12-09 ENCOUNTER — Other Ambulatory Visit: Payer: Self-pay | Admitting: Family Medicine

## 2010-12-09 DIAGNOSIS — E749 Disorder of carbohydrate metabolism, unspecified: Secondary | ICD-10-CM

## 2010-12-09 DIAGNOSIS — I10 Essential (primary) hypertension: Secondary | ICD-10-CM

## 2010-12-09 DIAGNOSIS — E785 Hyperlipidemia, unspecified: Secondary | ICD-10-CM

## 2010-12-12 ENCOUNTER — Other Ambulatory Visit: Payer: Self-pay | Admitting: Family Medicine

## 2010-12-12 ENCOUNTER — Other Ambulatory Visit (INDEPENDENT_AMBULATORY_CARE_PROVIDER_SITE_OTHER): Payer: Medicare Other

## 2010-12-12 DIAGNOSIS — Z1231 Encounter for screening mammogram for malignant neoplasm of breast: Secondary | ICD-10-CM

## 2010-12-12 DIAGNOSIS — E749 Disorder of carbohydrate metabolism, unspecified: Secondary | ICD-10-CM

## 2010-12-12 DIAGNOSIS — E785 Hyperlipidemia, unspecified: Secondary | ICD-10-CM

## 2010-12-12 LAB — HEPATIC FUNCTION PANEL
ALT: 19 U/L (ref 0–35)
AST: 19 U/L (ref 0–37)
Alkaline Phosphatase: 61 U/L (ref 39–117)
Bilirubin, Direct: 0 mg/dL (ref 0.0–0.3)
Total Bilirubin: 0.6 mg/dL (ref 0.3–1.2)

## 2010-12-12 LAB — RENAL FUNCTION PANEL
Albumin: 4.2 g/dL (ref 3.5–5.2)
CO2: 28 mEq/L (ref 19–32)
Calcium: 9.4 mg/dL (ref 8.4–10.5)
Potassium: 3.9 mEq/L (ref 3.5–5.1)
Sodium: 139 mEq/L (ref 135–145)

## 2010-12-12 LAB — LDL CHOLESTEROL, DIRECT: Direct LDL: 135.9 mg/dL

## 2010-12-12 LAB — TSH: TSH: 1.65 u[IU]/mL (ref 0.35–5.50)

## 2010-12-12 LAB — LIPID PANEL
Total CHOL/HDL Ratio: 3
VLDL: 30.4 mg/dL (ref 0.0–40.0)

## 2010-12-19 ENCOUNTER — Ambulatory Visit (INDEPENDENT_AMBULATORY_CARE_PROVIDER_SITE_OTHER): Payer: Medicare Other | Admitting: Family Medicine

## 2010-12-19 ENCOUNTER — Encounter: Payer: Self-pay | Admitting: Family Medicine

## 2010-12-19 DIAGNOSIS — E785 Hyperlipidemia, unspecified: Secondary | ICD-10-CM

## 2010-12-19 DIAGNOSIS — I10 Essential (primary) hypertension: Secondary | ICD-10-CM

## 2010-12-19 DIAGNOSIS — Z0000 Encounter for general adult medical examination without abnormal findings: Secondary | ICD-10-CM

## 2010-12-19 DIAGNOSIS — E749 Disorder of carbohydrate metabolism, unspecified: Secondary | ICD-10-CM

## 2010-12-19 DIAGNOSIS — N951 Menopausal and female climacteric states: Secondary | ICD-10-CM

## 2010-12-19 DIAGNOSIS — K219 Gastro-esophageal reflux disease without esophagitis: Secondary | ICD-10-CM

## 2010-12-19 DIAGNOSIS — T7840XA Allergy, unspecified, initial encounter: Secondary | ICD-10-CM

## 2010-12-19 DIAGNOSIS — N63 Unspecified lump in unspecified breast: Secondary | ICD-10-CM

## 2010-12-19 DIAGNOSIS — M949 Disorder of cartilage, unspecified: Secondary | ICD-10-CM

## 2010-12-19 NOTE — Assessment & Plan Note (Signed)
Adequately controlled. Takes one PPI daily "To be careful."

## 2010-12-19 NOTE — Assessment & Plan Note (Signed)
Some sweats in the early AM. Sounds stable.

## 2010-12-19 NOTE — Progress Notes (Signed)
  Subjective:    Patient ID: Kristine Owens, female    DOB: 04-Sep-1930, 77 y.o.   MRN: [id number]  HPI Pt hnere for Comp Exam. She was seen a few months ago for suspected breast mass which neither of [place] felt when here. She has not felt it since being here. [facility]. She feels well and has no complaints.     Review of Systems  Constitutional: Negative for fever, chills, diaphoresis, fatigue and unexpected weight change.       Has occas sweats.  HENT: Negative for hearing loss, ear pain, rhinorrhea, trouble swallowing and tinnitus.   Eyes: Negative for pain, discharge and visual disturbance.  Respiratory: Negative for cough, shortness of breath and wheezing.        Had a cold a while ago and is still coughing.  Cardiovascular: Negative for chest pain, palpitations and leg swelling.       No Fainting or Fatigue.  Gastrointestinal: Negative for nausea, vomiting, abdominal pain, diarrhea, constipation and blood in stool.       No Heartburn  Genitourinary: Negative for dysuria and frequency.  Musculoskeletal: Negative for myalgias, back pain and arthralgias.  Skin: Negative for rash.       No Itching or Dryness.  Neurological: Negative for tremors and numbness.       No Tingling. No Balance Problems.  Hematological: Negative for adenopathy. Does not bruise/bleed easily.  Psychiatric/Behavioral: Negative for dysphoric mood and agitation.       Objective:   Physical Exam  Constitutional: She is oriented to person, place, and time. She appears well-developed and well-nourished. No distress.  HENT:  Head: Normocephalic and atraumatic.  Left Ear: External ear normal.  Nose: Nose normal.  Mouth/Throat: Oropharynx is clear and moist. No oropharyngeal exudate.  Eyes: Conjunctivae and EOM are normal. Pupils are equal, round, and reactive to light. No scleral icterus.  Neck: Normal range of motion. Neck supple. No thyromegaly present.    Cardiovascular: Normal rate, regular rhythm and normal heart sounds.  Exam reveals no friction rub.   No murmur heard. Pulmonary/Chest: Effort normal and breath sounds normal. No respiratory distress. She has no wheezes. She has no rales.       Breasts NT w/o mass, nipple discharge or axillary adenopathy.  Abdominal: Soft. Bowel sounds are normal. She exhibits no mass. There is no tenderness.  Genitourinary:       Vaginal/rectal not done.  Musculoskeletal: Normal range of motion. She exhibits no edema and no tenderness.  Lymphadenopathy:    She has no cervical adenopathy.  Neurological: She is alert and oriented to person, place, and time. She has normal reflexes.  Skin: Skin is warm and dry. No rash noted. She is not diaphoretic. No erythema.  Psychiatric: She has a normal mood and affect. Her behavior is normal. Judgment and thought content normal.          Assessment & Plan:  HMPE  I have personally reviewed the Medicare Annual Wellness questionnaire and have noted 1. The patient's medical and social history 2. Their use of alcohol, tobacco or illicit drugs 3. Their current medications and supplements 4. The patient's functional ability including ADL's, fall risks, home safety risks and hearing or visual             impairment. 5. Diet and physical activities 6. Evidence for depression or mood disorders  No cognitive defects noted.

## 2010-12-19 NOTE — Assessment & Plan Note (Signed)
Nearly adequate. LDL could be lower. Discussed diet. Declines medication. Lab Results  Component Value Date   CHOL 218* 12/12/2010   HDL 72.20 12/12/2010   LDLDIRECT 135.9 12/12/2010   TRIG 152.0* 12/12/2010   CHOLHDL 3 12/12/2010

## 2010-12-19 NOTE — Assessment & Plan Note (Signed)
None noted today. Has mammo next month, normal screening.

## 2010-12-19 NOTE — Assessment & Plan Note (Signed)
Good control. Cont curr meds. BP Readings from Last 3 Encounters:  12/19/10 122/78  10/31/10 138/76  12/13/09 126/76

## 2010-12-19 NOTE — Patient Instructions (Signed)
Take Guaifenesin (400mg ), take 1 tab by mouth AM. Get GUAIFENESIN by  [facility] and getting MUCOUS RELIEF EXPECTORANT/CONGESTION. DO NOT GET MUCINEX (Timed Release Guaifenesin)

## 2010-12-19 NOTE — Assessment & Plan Note (Signed)
Has mucous in the AM. Suggest Guaif in the AM regularly for a while do help get up.  See instructions.

## 2010-12-19 NOTE — Assessment & Plan Note (Signed)
Stable, no higher. Discussed ongoing diet approach.

## 2010-12-19 NOTE — Assessment & Plan Note (Signed)
IOn Vit D. Had DEXA 2 years ago and was nml.

## 2011-01-02 ENCOUNTER — Ambulatory Visit (INDEPENDENT_AMBULATORY_CARE_PROVIDER_SITE_OTHER): Payer: Medicare Other | Admitting: Family Medicine

## 2011-01-02 ENCOUNTER — Encounter: Payer: Self-pay | Admitting: Family Medicine

## 2011-01-02 DIAGNOSIS — J069 Acute upper respiratory infection, unspecified: Secondary | ICD-10-CM

## 2011-01-02 MED ORDER — BENZONATATE 200 MG PO CAPS: 200.0000 mg | ORAL_CAPSULE | Freq: Three times a day (TID) | ORAL | Status: AC | PRN

## 2011-01-02 NOTE — Patient Instructions (Signed)
Drink plenty of fluids, take tylenol as needed, and gargle with warm salt water for your throat.  This should gradually improve.  Take care.  Let us know if you have other concerns.  Take the tessalon for cough.

## 2011-01-02 NOTE — Progress Notes (Signed)
duration of symptoms: since Saturday night.   Rhinorrhea: some, clear Congestion: yes ear pain: yes, bilateral sore throat:no Cough:yes, some sputum but she has trouble clearing it Myalgias:some prev, better now Likely with fever prev, but not today other concerns: h/o bronchitis Taking mucinex Overall, she isn't worse and she isn't much better  ROS: See HPI.  Otherwise negative.    Meds, vitals, and allergies reviewed.   GEN: nad, alert and oriented HEENT: mucous membranes moist, TM w/o erythema, nasal epithelium injected, OP with cobblestoning, frontal and max sinuses not ttp NECK: supple w/o LA CV: rrr. PULM: ctab, no inc wob ABD: soft, +bs EXT: no edema

## 2011-01-03 ENCOUNTER — Encounter: Payer: Self-pay | Admitting: Family Medicine

## 2011-01-03 ENCOUNTER — Ambulatory Visit: Payer: Medicare Other | Admitting: Family Medicine

## 2011-01-03 DIAGNOSIS — J069 Acute upper respiratory infection, unspecified: Secondary | ICD-10-CM | POA: Insufficient documentation

## 2011-01-03 NOTE — Assessment & Plan Note (Signed)
With cough.  Tessalon, supportive tx.  Likely viral.  Okay for outpatient fu, f/u prn. She agrees.  ddx d/w pt. No indication for abx.

## 2011-01-06 ENCOUNTER — Other Ambulatory Visit: Payer: Self-pay | Admitting: Family Medicine

## 2011-01-21 ENCOUNTER — Ambulatory Visit
Admission: RE | Admit: 2011-01-21 | Discharge: 2011-01-21 | Disposition: A | Discharge status: Home / Self Care | Payer: Medicare Other | Source: Ambulatory Visit | Attending: Family Medicine | Admitting: Family Medicine

## 2011-01-21 DIAGNOSIS — Z1231 Encounter for screening mammogram for malignant neoplasm of breast: Secondary | ICD-10-CM

## 2011-05-28 DIAGNOSIS — H2510 Age-related nuclear cataract, unspecified eye: Secondary | ICD-10-CM | POA: Diagnosis not present

## 2011-05-28 DIAGNOSIS — H40029 Open angle with borderline findings, high risk, unspecified eye: Secondary | ICD-10-CM | POA: Diagnosis not present

## 2011-05-28 DIAGNOSIS — H35379 Puckering of macula, unspecified eye: Secondary | ICD-10-CM | POA: Diagnosis not present

## 2011-05-28 DIAGNOSIS — H35349 Macular cyst, hole, or pseudohole, unspecified eye: Secondary | ICD-10-CM | POA: Diagnosis not present

## 2011-05-28 DIAGNOSIS — H43399 Other vitreous opacities, unspecified eye: Secondary | ICD-10-CM | POA: Diagnosis not present

## 2011-07-31 DIAGNOSIS — H40019 Open angle with borderline findings, low risk, unspecified eye: Secondary | ICD-10-CM | POA: Diagnosis not present

## 2011-10-01 DIAGNOSIS — D235 Other benign neoplasm of skin of trunk: Secondary | ICD-10-CM | POA: Diagnosis not present

## 2011-10-01 DIAGNOSIS — B079 Viral wart, unspecified: Secondary | ICD-10-CM | POA: Diagnosis not present

## 2011-12-10 ENCOUNTER — Other Ambulatory Visit: Payer: Self-pay | Admitting: Family Medicine

## 2011-12-12 ENCOUNTER — Other Ambulatory Visit: Payer: Self-pay | Admitting: Family Medicine

## 2011-12-12 DIAGNOSIS — Z1231 Encounter for screening mammogram for malignant neoplasm of breast: Secondary | ICD-10-CM

## 2011-12-15 ENCOUNTER — Other Ambulatory Visit: Payer: Self-pay | Admitting: Family Medicine

## 2011-12-15 DIAGNOSIS — I10 Essential (primary) hypertension: Secondary | ICD-10-CM

## 2011-12-15 DIAGNOSIS — E785 Hyperlipidemia, unspecified: Secondary | ICD-10-CM

## 2011-12-16 ENCOUNTER — Other Ambulatory Visit (INDEPENDENT_AMBULATORY_CARE_PROVIDER_SITE_OTHER): Payer: Medicare Other

## 2011-12-16 DIAGNOSIS — E785 Hyperlipidemia, unspecified: Secondary | ICD-10-CM | POA: Diagnosis not present

## 2011-12-16 DIAGNOSIS — I10 Essential (primary) hypertension: Secondary | ICD-10-CM

## 2011-12-16 LAB — BASIC METABOLIC PANEL
Chloride: 100 mEq/L (ref 96–112)
GFR: 69.52 mL/min (ref >60.00)
Glucose, Bld: 104 mg/dL — ABNORMAL HIGH (ref 70–99)
Potassium: 3.8 mEq/L (ref 3.5–5.1)
Sodium: 139 mEq/L (ref 135–145)

## 2011-12-16 LAB — LIPID PANEL
HDL: 69.2 mg/dL (ref >39.00)
VLDL: 23.2 mg/dL (ref 0.0–40.0)

## 2011-12-16 LAB — LDL CHOLESTEROL, DIRECT: Direct LDL: 121.3 mg/dL

## 2011-12-23 ENCOUNTER — Encounter: Payer: Self-pay | Admitting: Family Medicine

## 2011-12-23 ENCOUNTER — Ambulatory Visit (INDEPENDENT_AMBULATORY_CARE_PROVIDER_SITE_OTHER): Payer: Medicare Other | Admitting: Family Medicine

## 2011-12-23 VITALS — BP 126/78 | HR 56 | Temp 98.1°F | Ht 66.5 in | Wt 163.2 lb

## 2011-12-23 DIAGNOSIS — E785 Hyperlipidemia, unspecified: Secondary | ICD-10-CM | POA: Diagnosis not present

## 2011-12-23 DIAGNOSIS — I10 Essential (primary) hypertension: Secondary | ICD-10-CM

## 2011-12-23 DIAGNOSIS — Z0000 Encounter for general adult medical examination without abnormal findings: Secondary | ICD-10-CM

## 2011-12-23 DIAGNOSIS — E749 Disorder of carbohydrate metabolism, unspecified: Secondary | ICD-10-CM

## 2011-12-23 DIAGNOSIS — K219 Gastro-esophageal reflux disease without esophagitis: Secondary | ICD-10-CM

## 2011-12-23 DIAGNOSIS — Z23 Encounter for immunization: Secondary | ICD-10-CM

## 2011-12-23 NOTE — Progress Notes (Signed)
Subjective:    Patient ID: Kristine Owens, female    DOB: [birth year], 78 y.o.   MRN: [id number]  HPI CC: medicare wellness visit  No questions or concerns today.  Failed hearing screen today.  notices trouble with hearing.  Not current interested in audiology eval. 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  No sadness No falls in last year.  Preventative: Colon cancer screening - 2010 ([facility]) normal, some ext hemorrhoids.  Given h/o adenomatous polyps, rec rpt in 5 yrs Mammogram - has appt 01/2012.  Last was normal 2012. Cervical cancer screening - s/p hysterectomy Flu shot - done 11/2011 Pneumovax - 2003 Shingles - 2011 Td - today. Advanced directives: would want daughter to be HCPOA.  Married/remarried Lives with husband. 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  Past histories reviewed and updated if relevant as below. Patient Active Problem List  Diagnosis  . CARBOHYDRATE METABOLISM DISORDER  . HYPERLIPIDEMIA  . HYPERTENSION  . GERD  . POSTMENOPAUSAL STATUS  . OSTEOPENIA  . ALLERGY   Past Medical History  Diagnosis Date  . GERD (gastroesophageal reflux disease) 02/2000  . Hyperlipemia 07/2000  . Hypertension 01/2001   Past Surgical History  Procedure Date  . Total abdominal hysterectomy 1981    w/BSO for fibroids (Dr. Luciana Axe)  . Tonsillectomy    History  Substance Use Topics  . Smoking status: Never Smoker   . Smokeless tobacco: Never Used  . Alcohol Use: 0.5 oz/week    1 drink(s) per week     Comment: red wine several times a week   Family History  Problem Relation Age of Onset  . Hypertension Mother   . Heart disease Father     MI, CVD  . Kidney disease  Father     dialysis  . Hypertension Father   . Cancer Brother     lung (smoker)  . Aneurysm Brother     of aorta X 2  . Cancer Maternal Aunt     3 aunts (breast, pancreatic)   Allergies  Allergen Reactions  . Codeine Sulfate     REACTION: NAUSEA   Current Outpatient Prescriptions on File Prior to Visit  Medication Sig Dispense Refill  . atenolol (TENORMIN) 50 MG tablet TAKE ONE TABLET BY MOUTH TWICE DAILY  60 tablet  0  . b complex vitamins tablet Take 1 tablet by mouth daily.        . cholecalciferol (VITAMIN D) 1000 UNITS tablet Take 1,000 Units by mouth daily.        . hydrochlorothiazide (HYDRODIURIL) 12.5 MG tablet Take 12.5 mg by mouth daily.        Marland Kitchen losartan (COZAAR) 50 MG tablet TAKE ONE TABLET BY MOUTH EVERY DAY  30 tablet  0  . Multiple Vitamin (MULTIVITAMIN) tablet Take 1 tablet by mouth daily.        Marland Kitchen omeprazole (PRILOSEC OTC) 20 MG tablet Take 20 mg by mouth as needed.        . [DISCONTINUED] hydrochlorothiazide (MICROZIDE) 12.5 MG capsule TAKE ONE CAPSULE BY MOUTH EVERY DAY  30 capsule  0    Review of Systems  Constitutional: Negative for fever, chills, activity change, appetite change, fatigue and unexpected weight change.  HENT: Negative for hearing loss and neck  pain.   Eyes: Negative for visual disturbance.  Respiratory: Negative for cough, chest tightness, shortness of breath and wheezing.   Cardiovascular: Negative for chest pain, palpitations and leg swelling.  Gastrointestinal: Negative for nausea, vomiting, abdominal pain, diarrhea, constipation, blood in stool and abdominal distention.  Genitourinary: Negative for hematuria and difficulty urinating.  Musculoskeletal: Negative for myalgias and arthralgias.  Skin: Negative for rash.  Neurological: Negative for dizziness, seizures, syncope and headaches.  Hematological: Does not bruise/bleed easily.  Psychiatric/Behavioral: Negative for dysphoric mood. The patient is not nervous/anxious.          Objective:   Physical Exam  Nursing note and vitals reviewed. Constitutional: She is oriented to person, place, and time. She appears well-developed and well-nourished. No distress.  HENT:  Head: Normocephalic and atraumatic.  Right Ear: External ear normal.  Left Ear: External ear normal.  Nose: Nose normal.  Mouth/Throat: Oropharynx is clear and moist. No oropharyngeal exudate.  Eyes: Conjunctivae normal and EOM are normal. Pupils are equal, round, and reactive to light. No scleral icterus.  Neck: Normal range of motion. Neck supple. No thyromegaly present.  Cardiovascular: Normal rate, regular rhythm, normal heart sounds and intact distal pulses.   No murmur heard. Pulses:      Radial pulses are 2+ on the right side, and 2+ on the left side.  Pulmonary/Chest: Effort normal and breath sounds normal. No respiratory distress. She has no wheezes. She has no rales. Right breast exhibits no inverted nipple, no mass, no nipple discharge, no skin change and no tenderness. Left breast exhibits no inverted nipple, no mass, no nipple discharge, no skin change and no tenderness.  Abdominal: Soft. Bowel sounds are normal. She exhibits no distension and no mass. There is no tenderness. There is no rebound and no guarding.  Musculoskeletal: Normal range of motion. She exhibits no edema.  Lymphadenopathy:    She has no cervical adenopathy.    She has no axillary adenopathy.       Right axillary: No lateral adenopathy present.       Left axillary: No lateral adenopathy present.      Right: No supraclavicular adenopathy present.       Left: No supraclavicular adenopathy present.  Neurological: She is alert and oriented to person, place, and time.       CN grossly intact, station and gait intact  Skin: Skin is warm and dry. No rash noted.  Psychiatric: She has a normal mood and affect. Her behavior is normal. Judgment and thought content normal.       Assessment & Plan:

## 2011-12-23 NOTE — Assessment & Plan Note (Addendum)
I have personally reviewed the Medicare Annual Wellness questionnaire and have noted 1. The patient's medical and social history 2. Their use of alcohol, tobacco or illicit drugs 3. Their current medications and supplements 4. The patient's functional ability including ADL's, fall risks, home safety risks and hearing or visual impairment. 5. Diet and physical activity 6. Evidence for depression or mood disorders The patients weight, height, BMI have been recorded in the chart.  Hearing and vision has been addressed. I have made referrals, counseling and provided education to the patient based review of the above and I have provided the pt with a written personalized care plan for preventive services. See scanned questionairre. Advanced directives discussed: packet provided.  Would want daughter to be HCPOA.  Reviewed preventative protocols and updated unless pt declined. Td today. Breast exam benign today.  rec keep mammo appt. Failed hearing, asked her to let us know if desires audiology referral.

## 2011-12-23 NOTE — Assessment & Plan Note (Signed)
Chronic, minimal off meds.  

## 2011-12-23 NOTE — Assessment & Plan Note (Signed)
Discussed avoiding added sugars. 

## 2011-12-23 NOTE — Assessment & Plan Note (Signed)
Chronic, stable The current medical regimen is effective;  continue present plan and medications. BP Readings from Last 3 Encounters:  12/23/11 126/78  01/02/11 134/72  12/19/10 122/78

## 2011-12-23 NOTE — Patient Instructions (Addendum)
Tetanus today. Keep appointment for mammogram. Avoid added sugars and white starches. Good to see you today, call us with questions. Advanced directives packet provided today.

## 2011-12-23 NOTE — Addendum Note (Signed)
Addended by: Josph Macho A on: 12/23/2011 09:45 AM   Modules accepted: Orders

## 2011-12-23 NOTE — Assessment & Plan Note (Signed)
Stable on current PPI. Continue.

## 2012-01-06 ENCOUNTER — Other Ambulatory Visit: Payer: Self-pay | Admitting: Family Medicine

## 2012-01-22 ENCOUNTER — Ambulatory Visit
Admission: RE | Admit: 2012-01-22 | Discharge: 2012-01-22 | Disposition: A | Discharge status: Home / Self Care | Payer: Medicare Other | Source: Ambulatory Visit | Attending: Family Medicine | Admitting: Family Medicine

## 2012-01-22 DIAGNOSIS — Z1231 Encounter for screening mammogram for malignant neoplasm of breast: Secondary | ICD-10-CM

## 2012-01-26 ENCOUNTER — Encounter: Payer: Self-pay | Admitting: *Deleted

## 2012-02-27 ENCOUNTER — Other Ambulatory Visit: Payer: Self-pay | Admitting: *Deleted

## 2012-02-27 MED ORDER — ATENOLOL 50 MG PO TABS: 50.0000 mg | ORAL_TABLET | Freq: Two times a day (BID) | ORAL | Status: DC

## 2012-02-27 MED ORDER — HYDROCHLOROTHIAZIDE 12.5 MG PO TABS: 12.5000 mg | ORAL_TABLET | Freq: Every day | ORAL | Status: DC

## 2012-02-27 MED ORDER — LOSARTAN POTASSIUM 50 MG PO TABS: 50.0000 mg | ORAL_TABLET | Freq: Every day | ORAL | Status: DC

## 2012-02-27 NOTE — Telephone Encounter (Signed)
Sent to new pharmacy, mail order

## 2012-05-10 ENCOUNTER — Ambulatory Visit (INDEPENDENT_AMBULATORY_CARE_PROVIDER_SITE_OTHER): Payer: Medicare Other | Admitting: Family Medicine

## 2012-05-10 ENCOUNTER — Telehealth: Payer: Self-pay | Admitting: Family Medicine

## 2012-05-10 ENCOUNTER — Encounter: Payer: Self-pay | Admitting: Family Medicine

## 2012-05-10 VITALS — BP 188/70 | HR 72 | Temp 97.8°F | Wt 169.0 lb

## 2012-05-10 DIAGNOSIS — I10 Essential (primary) hypertension: Secondary | ICD-10-CM

## 2012-05-10 MED ORDER — HYDROCHLOROTHIAZIDE 25 MG PO TABS: 25.0000 mg | ORAL_TABLET | Freq: Every day | ORAL | Status: DC

## 2012-05-10 MED ORDER — LOSARTAN POTASSIUM 50 MG PO TABS: 50.0000 mg | ORAL_TABLET | Freq: Every day | ORAL | Status: DC

## 2012-05-10 NOTE — Progress Notes (Signed)
  Subjective:    Patient ID: Leanord Owens, female    DOB: Jul 17, 1930, 79 y.o.   MRN: [id number]  HPI CC: lightheadedness  bp at home has been running elevated - has noticed ever since starting mail order from pharmacy bp has been creeping upwards.  Received new HCTZ and losartan tablets. At home readings have ranged from 160-190/70-90s Increased lightheadedness recently last few days.  Normally takes hctz in am, and losartan in pm, atenolol bid.  Brings home cuff today - when checked, R 207/84, HR 61 On manual recheck R 188/70. On repeat - R 180/70, L 170/70  Denies headaches, vision changes, chest pain, tightness, shortness of breath or leg swelling.  BP Readings from Last 3 Encounters:  05/10/12 188/70  12/23/11 126/78  01/02/11 134/72   Wt Readings from Last 3 Encounters:  05/10/12 169 lb (76.658 kg)  12/23/11 163 lb 4 oz (74.05 kg)  01/02/11 161 lb 0.6 oz (73.047 kg)    Review of Systems Per HPI    Objective:   Physical Exam  Nursing note and vitals reviewed. Constitutional: She appears well-developed and well-nourished. No distress.  HENT:  Head: Normocephalic and atraumatic.  Mouth/Throat: Oropharynx is clear and moist. No oropharyngeal exudate.  Eyes: Conjunctivae and EOM are normal. Pupils are equal, round, and reactive to light. No scleral icterus.  Neck: Normal range of motion. Neck supple. Carotid bruit is not present.  Cardiovascular: Normal rate, regular rhythm, normal heart sounds and intact distal pulses.   No murmur heard. Pulmonary/Chest: Effort normal and breath sounds normal. No respiratory distress. She has no wheezes. She has no rales.  Musculoskeletal: She exhibits no edema.  Lymphadenopathy:    She has no cervical adenopathy.  Skin: Skin is warm and dry.  Psychiatric: She has a normal mood and affect.       Assessment & Plan:

## 2012-05-10 NOTE — Patient Instructions (Signed)
Return in 1 month for follow up. Buy new blood pressure cuff. Increase hctz to 25mg  daily (new prescription sent in) Start cozaar (brand name losartan) instead of one you have.

## 2012-05-10 NOTE — Telephone Encounter (Signed)
Will see today.  

## 2012-05-10 NOTE — Assessment & Plan Note (Addendum)
Concerned for ineffective losartan - will change to brand cozaar.   Will also increase hctz to 25mg  daily - sent new dose to pharmacy ,pt to double up on current dose until runs out. I also suggested she buy new bp cuff Pt agrees with plan. rtc 1 month for f/u.

## 2012-05-10 NOTE — Telephone Encounter (Signed)
Patient Information:  Caller Name: Shaketta  Phone: [phone]  Patient: Kristine, Owens  Gender: Female  DOB: 1930-08-30  Age: 79 Years  PCP: Eustaquio Boyden [facility])  Office Follow Up:  Does the office need to follow up with this patient?: No  Instructions For The Office: N/A  RN Note:  pt reports she has taken an extra Atenolol on 4/6 and 4/7  Symptoms  Reason For Call & Symptoms: blood pressure running high.  Pt is worried that since she has been getting medications thru the mail, her bp is higher.  193/92 (this am -05/10/12) and 160/83 (now) Pt denies any headaches but does state she feels light headed  Reviewed Health History In EMR: Yes  Reviewed Medications In EMR: Yes  Reviewed Allergies In EMR: Yes  Reviewed Surgeries / Procedures: Yes  Date of Onset of Symptoms: 05/08/2012  Guideline(s) Used:  High Blood Pressure  Disposition Per Guideline:   See Today in Office  Reason For Disposition Reached:   Patient wants to be seen  Advice Given:  Call Back If:  Headache, blurred vision, difficulty talking, or difficulty walking occurs  You become worse.  Patient Will Follow Care Advice:  YES  Appointment Scheduled:  05/10/2012 15:30:00 Appointment Scheduled Provider:  Eustaquio Boyden [facility])

## 2012-05-12 DIAGNOSIS — H40019 Open angle with borderline findings, low risk, unspecified eye: Secondary | ICD-10-CM | POA: Diagnosis not present

## 2012-05-12 DIAGNOSIS — H35379 Puckering of macula, unspecified eye: Secondary | ICD-10-CM | POA: Diagnosis not present

## 2012-05-12 DIAGNOSIS — H43399 Other vitreous opacities, unspecified eye: Secondary | ICD-10-CM | POA: Diagnosis not present

## 2012-05-12 DIAGNOSIS — H538 Other visual disturbances: Secondary | ICD-10-CM | POA: Diagnosis not present

## 2012-05-12 DIAGNOSIS — H2510 Age-related nuclear cataract, unspecified eye: Secondary | ICD-10-CM | POA: Diagnosis not present

## 2012-10-01 DIAGNOSIS — L819 Disorder of pigmentation, unspecified: Secondary | ICD-10-CM | POA: Diagnosis not present

## 2012-10-01 DIAGNOSIS — L570 Actinic keratosis: Secondary | ICD-10-CM | POA: Diagnosis not present

## 2012-10-01 DIAGNOSIS — D235 Other benign neoplasm of skin of trunk: Secondary | ICD-10-CM | POA: Diagnosis not present

## 2012-12-03 DIAGNOSIS — L570 Actinic keratosis: Secondary | ICD-10-CM | POA: Diagnosis not present

## 2012-12-03 DIAGNOSIS — D485 Neoplasm of uncertain behavior of skin: Secondary | ICD-10-CM | POA: Diagnosis not present

## 2012-12-05 ENCOUNTER — Other Ambulatory Visit: Payer: Self-pay | Admitting: Family Medicine

## 2012-12-05 DIAGNOSIS — I10 Essential (primary) hypertension: Secondary | ICD-10-CM

## 2012-12-07 ENCOUNTER — Other Ambulatory Visit (INDEPENDENT_AMBULATORY_CARE_PROVIDER_SITE_OTHER): Payer: Medicare Other

## 2012-12-07 DIAGNOSIS — I10 Essential (primary) hypertension: Secondary | ICD-10-CM

## 2012-12-07 LAB — BASIC METABOLIC PANEL
CO2: 31 mEq/L (ref 19–32)
Chloride: 98 mEq/L (ref 96–112)
Potassium: 3.6 mEq/L (ref 3.5–5.1)

## 2012-12-07 LAB — TSH: TSH: 3.04 u[IU]/mL (ref 0.35–5.50)

## 2012-12-14 ENCOUNTER — Encounter: Payer: Self-pay | Admitting: Family Medicine

## 2012-12-14 ENCOUNTER — Ambulatory Visit (INDEPENDENT_AMBULATORY_CARE_PROVIDER_SITE_OTHER): Payer: Medicare Other | Admitting: Family Medicine

## 2012-12-14 VITALS — BP 146/78 | HR 60 | Temp 98.3°F | Wt 166.2 lb

## 2012-12-14 DIAGNOSIS — N63 Unspecified lump in unspecified breast: Secondary | ICD-10-CM

## 2012-12-14 DIAGNOSIS — I10 Essential (primary) hypertension: Secondary | ICD-10-CM | POA: Diagnosis not present

## 2012-12-14 DIAGNOSIS — N6310 Unspecified lump in the right breast, unspecified quadrant: Secondary | ICD-10-CM

## 2012-12-14 NOTE — Progress Notes (Signed)
Pre-visit discussion using our clinic review tool. No additional management support is needed unless otherwise documented below in the visit note.  

## 2012-12-14 NOTE — Progress Notes (Signed)
  Subjective:    Patient ID: Kristine Owens, female    DOB: 02-Sep-1930, 79 y.o.   MRN: [id number]  HPI CC: f/u HTN  HTN - bp much better controlled since starting brand cozaar 50mg  daily.  No HA, vision changes, CP/tightness, SOB, leg swelling.   BP Readings from Last 3 Encounters:  12/14/12 146/78  05/10/12 188/70  12/23/11 126/78    No concerns/questions today.  Still occasionally feels lump on right breast.  Mammogram last year WNL.  No change in lump.  No pain at area.  No nipple discharge.  No other swelling or mass.  Past Medical History  Diagnosis Date  . GERD (gastroesophageal reflux disease) 02/2000  . Hyperlipemia 07/2000  . Hypertension 01/2001     Review of Systems Per HPI    Objective:   Physical Exam  Nursing note and vitals reviewed. Constitutional: She appears well-developed and well-nourished. No distress.  HENT:  Mouth/Throat: Oropharynx is clear and moist. No oropharyngeal exudate.  Eyes: Conjunctivae and EOM are normal. Pupils are equal, round, and reactive to light. No scleral icterus.  Neck: No thyromegaly present.  Cardiovascular: Normal rate, regular rhythm, normal heart sounds and intact distal pulses.   No murmur heard. Pulmonary/Chest: Effort normal and breath sounds normal. No respiratory distress. She has no wheezes. She has no rales. Right breast exhibits mass. Right breast exhibits no inverted nipple, no nipple discharge, no skin change and no tenderness. Left breast exhibits no inverted nipple, no mass, no nipple discharge, no skin change and no tenderness. Breasts are symmetrical.  Fullness appreciated R breast at around 10 o clock position about 2cm distal to nipple, nontender, no skin changes  Musculoskeletal: She exhibits no edema.  Lymphadenopathy:       Head (right side): No submandibular, no tonsillar, no preauricular and no posterior auricular adenopathy present.       Head (left side): No submandibular, no tonsillar, no  preauricular and no posterior auricular adenopathy present.    She has no cervical adenopathy.    She has no axillary adenopathy.       Right axillary: No lateral adenopathy present.       Left axillary: No lateral adenopathy present.      Right: No supraclavicular adenopathy present.       Left: No supraclavicular adenopathy present.       Assessment & Plan:

## 2012-12-14 NOTE — Patient Instructions (Signed)
I'm glad blood pressure is looking better - continue meds as up to now. For breast lump - let's go ahead and order more detailed mammogram this year. Good to see you today, call us with questions.

## 2012-12-14 NOTE — Assessment & Plan Note (Signed)
Chronic, stable. Improved control with cozaar - continue this med along with atenolol and hctz 25mg  daily.

## 2012-12-14 NOTE — Assessment & Plan Note (Addendum)
Upcoming screening mammogram next month. Some breast fullness found R lateral breast - deep to breast. Will order diagnostic mammo +/- [place] if needed.

## 2012-12-31 ENCOUNTER — Ambulatory Visit
Admission: RE | Admit: 2012-12-31 | Discharge: 2012-12-31 | Disposition: A | Discharge status: Home / Self Care | Payer: Medicare Other | Source: Ambulatory Visit | Attending: Family Medicine | Admitting: Family Medicine

## 2012-12-31 ENCOUNTER — Other Ambulatory Visit: Payer: Self-pay | Admitting: Family Medicine

## 2012-12-31 DIAGNOSIS — N6310 Unspecified lump in the right breast, unspecified quadrant: Secondary | ICD-10-CM

## 2013-01-04 ENCOUNTER — Other Ambulatory Visit: Payer: Self-pay

## 2013-01-04 ENCOUNTER — Other Ambulatory Visit: Payer: Self-pay | Admitting: Family Medicine

## 2013-01-04 DIAGNOSIS — N6310 Unspecified lump in the right breast, unspecified quadrant: Secondary | ICD-10-CM

## 2013-01-05 ENCOUNTER — Ambulatory Visit
Admission: RE | Admit: 2013-01-05 | Discharge: 2013-01-05 | Disposition: A | Discharge status: Home / Self Care | Payer: Medicare Other | Source: Ambulatory Visit | Attending: Family Medicine | Admitting: Family Medicine

## 2013-01-05 DIAGNOSIS — N6310 Unspecified lump in the right breast, unspecified quadrant: Secondary | ICD-10-CM

## 2013-01-05 DIAGNOSIS — N6459 Other signs and symptoms in breast: Secondary | ICD-10-CM | POA: Diagnosis not present

## 2013-01-06 ENCOUNTER — Encounter: Payer: Self-pay | Admitting: *Deleted

## 2013-01-07 ENCOUNTER — Other Ambulatory Visit: Payer: Self-pay | Admitting: Family Medicine

## 2013-02-03 DIAGNOSIS — H35371 Puckering of macula, right eye: Secondary | ICD-10-CM

## 2013-02-03 HISTORY — DX: Puckering of macula, right eye: H35.371

## 2013-03-21 ENCOUNTER — Other Ambulatory Visit: Payer: Self-pay | Admitting: Family Medicine

## 2013-04-26 ENCOUNTER — Encounter: Payer: Self-pay | Admitting: Gastroenterology

## 2013-05-17 ENCOUNTER — Other Ambulatory Visit: Payer: Self-pay | Admitting: Family Medicine

## 2013-05-18 DIAGNOSIS — H40029 Open angle with borderline findings, high risk, unspecified eye: Secondary | ICD-10-CM | POA: Diagnosis not present

## 2013-05-18 DIAGNOSIS — H43819 Vitreous degeneration, unspecified eye: Secondary | ICD-10-CM | POA: Diagnosis not present

## 2013-05-18 DIAGNOSIS — H35379 Puckering of macula, unspecified eye: Secondary | ICD-10-CM | POA: Diagnosis not present

## 2013-05-18 DIAGNOSIS — H2510 Age-related nuclear cataract, unspecified eye: Secondary | ICD-10-CM | POA: Diagnosis not present

## 2013-05-18 DIAGNOSIS — H25019 Cortical age-related cataract, unspecified eye: Secondary | ICD-10-CM | POA: Diagnosis not present

## 2013-06-02 ENCOUNTER — Encounter: Payer: Self-pay | Admitting: Family Medicine

## 2013-06-14 ENCOUNTER — Other Ambulatory Visit: Payer: Self-pay | Admitting: Family Medicine

## 2013-06-14 ENCOUNTER — Other Ambulatory Visit: Payer: Self-pay

## 2013-06-14 MED ORDER — LOSARTAN POTASSIUM 50 MG PO TABS: ORAL_TABLET | ORAL | Status: DC

## 2013-06-14 NOTE — Telephone Encounter (Signed)
Pt said will take approx 10 days to get losartan from mail order pharmacy and pt request # [phone] to [facility]; advised pt done.

## 2013-06-29 ENCOUNTER — Encounter (INDEPENDENT_AMBULATORY_CARE_PROVIDER_SITE_OTHER): Payer: Medicare Other | Admitting: Ophthalmology

## 2013-06-29 DIAGNOSIS — H43819 Vitreous degeneration, unspecified eye: Secondary | ICD-10-CM

## 2013-06-29 DIAGNOSIS — D3130 Benign neoplasm of unspecified choroid: Secondary | ICD-10-CM

## 2013-06-29 DIAGNOSIS — I10 Essential (primary) hypertension: Secondary | ICD-10-CM | POA: Diagnosis not present

## 2013-06-29 DIAGNOSIS — H35039 Hypertensive retinopathy, unspecified eye: Secondary | ICD-10-CM

## 2013-06-29 DIAGNOSIS — H33309 Unspecified retinal break, unspecified eye: Secondary | ICD-10-CM

## 2013-06-29 DIAGNOSIS — H35379 Puckering of macula, unspecified eye: Secondary | ICD-10-CM

## 2013-06-29 DIAGNOSIS — H2510 Age-related nuclear cataract, unspecified eye: Secondary | ICD-10-CM

## 2013-07-06 ENCOUNTER — Ambulatory Visit (INDEPENDENT_AMBULATORY_CARE_PROVIDER_SITE_OTHER): Payer: Medicare Other | Admitting: Ophthalmology

## 2013-07-06 DIAGNOSIS — H33309 Unspecified retinal break, unspecified eye: Secondary | ICD-10-CM

## 2013-09-28 ENCOUNTER — Other Ambulatory Visit: Payer: Self-pay | Admitting: Family Medicine

## 2013-09-30 DIAGNOSIS — L570 Actinic keratosis: Secondary | ICD-10-CM | POA: Diagnosis not present

## 2013-09-30 DIAGNOSIS — L819 Disorder of pigmentation, unspecified: Secondary | ICD-10-CM | POA: Diagnosis not present

## 2013-09-30 DIAGNOSIS — L821 Other seborrheic keratosis: Secondary | ICD-10-CM | POA: Diagnosis not present

## 2013-09-30 DIAGNOSIS — D235 Other benign neoplasm of skin of trunk: Secondary | ICD-10-CM | POA: Diagnosis not present

## 2013-10-28 ENCOUNTER — Other Ambulatory Visit: Payer: Self-pay | Admitting: Family Medicine

## 2013-11-01 DIAGNOSIS — H40029 Open angle with borderline findings, high risk, unspecified eye: Secondary | ICD-10-CM | POA: Diagnosis not present

## 2013-11-08 ENCOUNTER — Ambulatory Visit (INDEPENDENT_AMBULATORY_CARE_PROVIDER_SITE_OTHER): Payer: Medicare Other | Admitting: Ophthalmology

## 2013-11-08 DIAGNOSIS — H33301 Unspecified retinal break, right eye: Secondary | ICD-10-CM

## 2013-11-08 DIAGNOSIS — H35033 Hypertensive retinopathy, bilateral: Secondary | ICD-10-CM

## 2013-11-08 DIAGNOSIS — H35371 Puckering of macula, right eye: Secondary | ICD-10-CM

## 2013-11-08 DIAGNOSIS — H43813 Vitreous degeneration, bilateral: Secondary | ICD-10-CM

## 2013-11-08 DIAGNOSIS — D3132 Benign neoplasm of left choroid: Secondary | ICD-10-CM

## 2013-11-08 DIAGNOSIS — H2513 Age-related nuclear cataract, bilateral: Secondary | ICD-10-CM

## 2013-11-29 ENCOUNTER — Other Ambulatory Visit: Payer: Self-pay

## 2013-11-29 ENCOUNTER — Other Ambulatory Visit: Payer: Self-pay | Admitting: Family Medicine

## 2013-11-29 DIAGNOSIS — Z1231 Encounter for screening mammogram for malignant neoplasm of breast: Secondary | ICD-10-CM

## 2013-12-07 DIAGNOSIS — L820 Inflamed seborrheic keratosis: Secondary | ICD-10-CM | POA: Diagnosis not present

## 2013-12-07 DIAGNOSIS — L570 Actinic keratosis: Secondary | ICD-10-CM | POA: Diagnosis not present

## 2013-12-07 DIAGNOSIS — L821 Other seborrheic keratosis: Secondary | ICD-10-CM | POA: Diagnosis not present

## 2013-12-16 ENCOUNTER — Other Ambulatory Visit: Payer: Self-pay | Admitting: Family Medicine

## 2013-12-16 ENCOUNTER — Other Ambulatory Visit (INDEPENDENT_AMBULATORY_CARE_PROVIDER_SITE_OTHER): Payer: Medicare Other

## 2013-12-16 DIAGNOSIS — E785 Hyperlipidemia, unspecified: Secondary | ICD-10-CM | POA: Diagnosis not present

## 2013-12-16 DIAGNOSIS — I10 Essential (primary) hypertension: Secondary | ICD-10-CM

## 2013-12-16 LAB — BASIC METABOLIC PANEL
BUN: 16 mg/dL (ref 6–23)
CALCIUM: 9.4 mg/dL (ref 8.4–10.5)
CHLORIDE: 99 meq/L (ref 96–112)
CO2: 30 mEq/L (ref 19–32)
Creatinine, Ser: 0.8 mg/dL (ref 0.4–1.2)
GFR: 73.17 mL/min (ref >60.00)
Glucose, Bld: 123 mg/dL — ABNORMAL HIGH (ref 70–99)
Potassium: 3.3 mEq/L — ABNORMAL LOW (ref 3.5–5.1)
SODIUM: 136 meq/L (ref 135–145)

## 2013-12-16 LAB — TSH: TSH: 3.06 u[IU]/mL (ref 0.35–4.50)

## 2013-12-16 LAB — LIPID PANEL
CHOL/HDL RATIO: 4
CHOLESTEROL: 219 mg/dL — AB (ref 0–200)
HDL: 60.8 mg/dL (ref >39.00)
LDL Cholesterol: 137 mg/dL — ABNORMAL HIGH (ref 0–99)
NonHDL: 158.2
TRIGLYCERIDES: 106 mg/dL (ref 0.0–149.0)
VLDL: 21.2 mg/dL (ref 0.0–40.0)

## 2013-12-22 ENCOUNTER — Telehealth: Payer: Self-pay | Admitting: Family Medicine

## 2013-12-22 NOTE — Telephone Encounter (Signed)
emmi emailed °

## 2013-12-23 ENCOUNTER — Encounter: Payer: Self-pay | Admitting: Family Medicine

## 2013-12-23 ENCOUNTER — Ambulatory Visit (INDEPENDENT_AMBULATORY_CARE_PROVIDER_SITE_OTHER): Payer: Medicare Other | Admitting: Family Medicine

## 2013-12-23 VITALS — BP 124/70 | HR 57 | Temp 98.1°F | Ht 65.5 in | Wt 163.0 lb

## 2013-12-23 DIAGNOSIS — Z7189 Other specified counseling: Secondary | ICD-10-CM | POA: Insufficient documentation

## 2013-12-23 DIAGNOSIS — Z23 Encounter for immunization: Secondary | ICD-10-CM

## 2013-12-23 DIAGNOSIS — E785 Hyperlipidemia, unspecified: Secondary | ICD-10-CM

## 2013-12-23 DIAGNOSIS — Z0000 Encounter for general adult medical examination without abnormal findings: Secondary | ICD-10-CM

## 2013-12-23 DIAGNOSIS — N6310 Unspecified lump in the right breast, unspecified quadrant: Secondary | ICD-10-CM

## 2013-12-23 DIAGNOSIS — R739 Hyperglycemia, unspecified: Secondary | ICD-10-CM

## 2013-12-23 DIAGNOSIS — M899 Disorder of bone, unspecified: Secondary | ICD-10-CM

## 2013-12-23 DIAGNOSIS — M949 Disorder of cartilage, unspecified: Secondary | ICD-10-CM

## 2013-12-23 DIAGNOSIS — I10 Essential (primary) hypertension: Secondary | ICD-10-CM

## 2013-12-23 MED ORDER — ATENOLOL 50 MG PO TABS: ORAL_TABLET | ORAL | Status: DC

## 2013-12-23 MED ORDER — LOSARTAN POTASSIUM 50 MG PO TABS: 50.0000 mg | ORAL_TABLET | Freq: Every day | ORAL | Status: DC

## 2013-12-23 MED ORDER — HYDROCHLOROTHIAZIDE 12.5 MG PO TABS: 12.5000 mg | ORAL_TABLET | Freq: Every day | ORAL | Status: DC

## 2013-12-23 MED ORDER — HYDROCHLOROTHIAZIDE 25 MG PO TABS: 25.0000 mg | ORAL_TABLET | Freq: Every day | ORAL | Status: DC

## 2013-12-23 NOTE — Assessment & Plan Note (Signed)
DEXA 2010 WNL. Will resolve problem

## 2013-12-23 NOTE — Progress Notes (Signed)
BP 124/70 mmHg  Pulse 57  Temp(Src) 98.1 F (36.7 C) (Oral)  Ht 5' 5.5" (1.664 m)  Wt 163 lb (73.936 kg)  BMI 26.70 kg/m2  SpO2 98%   CC: medicare wellness  Subjective:    Patient ID: Kristine Owens, female    DOB: 1930-02-18, 80 y.o.   MRN: [id number]  HPI: Kristine Owens is a 80 y.o. female presenting on 12/23/2013 for Annual Exam   Failed hearing screen today.  Recently saw eye doctor.  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 No sadness No falls in last year.  Preventative: Colon cancer screening - 2010 Ardis Hughs) normal, some ext hemorrhoids. Given h/o adenomatous polyps, rec rpt in 5 yrs.  Mammogram - normal 01/2013 (dx and US done for mass but normal). Has f/u scheduled 01/12/2014. Cervical cancer screening - s/p hysterectomy. Aged out. DEXA - normal 2010.  Flu shot - today Pneumovax - 2003. prevnar today Shingles - 2011 Td - 2013 Advanced directives: thinks wants daughter be HCPOA. Has this at home and will bring [place] a copy.  Married/remarried Lives with husband. 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  Allergies and medications reviewed and updated. Current Outpatient Prescriptions on File Prior to Visit  Medication Sig  . b complex vitamins tablet Take 1 tablet by mouth daily.    . cholecalciferol (VITAMIN D) 1000 UNITS tablet Take 1,000 Units by mouth daily.    . Multiple Vitamin (MULTIVITAMIN) tablet Take 1 tablet by mouth daily.    Marland Kitchen omeprazole (PRILOSEC OTC) 20 MG tablet Take 20 mg by mouth as needed.     No current facility-administered medications on file prior to visit.    Review of Systems  Constitutional: Negative for fever, chills, activity change, appetite  change, fatigue and unexpected weight change.  HENT: Negative for hearing loss.   Eyes: Negative for visual disturbance.  Respiratory: Negative for cough, chest tightness, shortness of breath and wheezing.   Cardiovascular: Negative for chest pain, palpitations and leg swelling.  Gastrointestinal: Negative for nausea, vomiting, abdominal pain, diarrhea, constipation, blood in stool and abdominal distention.  Genitourinary: Negative for hematuria and difficulty urinating.  Musculoskeletal: Negative for myalgias, arthralgias and neck pain.  Skin: Negative for rash.  Neurological: Negative for dizziness, seizures, syncope and headaches.  Hematological: Negative for adenopathy. Does not bruise/bleed easily.  Psychiatric/Behavioral: Negative for dysphoric mood. The patient is not nervous/anxious.    Per HPI unless specifically indicated above    Objective:    BP 124/70 mmHg  Pulse 57  Temp(Src) 98.1 F (36.7 C) (Oral)  Ht 5' 5.5" (1.664 m)  Wt 163 lb (73.936 kg)  BMI 26.70 kg/m2  SpO2 98%  Physical Exam  Constitutional: She is oriented to person, place, and time. She appears well-developed and well-nourished. No distress.  HENT:  Head: Normocephalic and atraumatic.  Right Ear: Hearing, tympanic membrane, external ear and ear canal normal.  Left Ear: Hearing, tympanic membrane, external ear and ear canal normal.  Nose: Nose normal.  Mouth/Throat: Uvula is midline, oropharynx is clear and moist and mucous membranes are normal. No oropharyngeal exudate, posterior oropharyngeal edema or posterior oropharyngeal erythema.  Eyes: Conjunctivae and EOM are normal. Pupils are equal, round, and reactive to light. No scleral icterus.  Neck: Normal range of  motion. Neck supple. No thyromegaly present.  Cardiovascular: Normal rate, regular rhythm, normal heart sounds and intact distal pulses.   No murmur heard. Pulses:      Radial pulses are 2+ on the right side, and 2+ on the left side.    Pulmonary/Chest: Effort normal and breath sounds normal. No respiratory distress. She has no wheezes. She has no rales.  Abdominal: Soft. Bowel sounds are normal. She exhibits no distension and no mass. There is no tenderness. There is no rebound and no guarding.  Musculoskeletal: Normal range of motion. She exhibits no edema.  Lymphadenopathy:    She has no cervical adenopathy.  Neurological: She is alert and oriented to person, place, and time.  CN grossly intact, station and gait intact  Skin: Skin is warm and dry. No rash noted.  Psychiatric: She has a normal mood and affect. Her behavior is normal. Judgment and thought content normal.  Nursing note and vitals reviewed.  Results for orders placed or performed in visit on 12/16/13  TSH  Result Value Ref Range   TSH 3.06 0.35 - 4.50 uIU/mL  Basic metabolic panel  Result Value Ref Range   Sodium 136 135 - 145 mEq/L   Potassium 3.3 (L) 3.5 - 5.1 mEq/L   Chloride 99 96 - 112 mEq/L   CO2 30 19 - 32 mEq/L   Glucose, Bld 123 (H) 70 - 99 mg/dL   BUN 16 6 - 23 mg/dL   Creatinine, Ser 0.8 0.4 - 1.2 mg/dL   Calcium 9.4 8.4 - 10.5 mg/dL   GFR 73.17 >60.00 mL/min  Lipid panel  Result Value Ref Range   Cholesterol 219 (H) 0 - 200 mg/dL   Triglycerides 106.0 0.0 - 149.0 mg/dL   HDL 60.80 >39.00 mg/dL   VLDL 21.2 0.0 - 40.0 mg/dL   LDL Cholesterol 137 (H) 0 - 99 mg/dL   Total CHOL/HDL Ratio 4    NonHDL 158.20       Assessment & Plan:   Problem List Items Addressed This Visit    Medicare annual wellness visit, subsequent - Primary    I have personally reviewed the Medicare Annual Wellness questionnaire and have noted 1. The patient's medical and social history 2. Their use of alcohol, tobacco or illicit drugs 3. Their current medications and supplements 4. The patient's functional ability including ADL's, fall risks, home safety risks and hearing or visual impairment. 5. Diet and physical activity 6. Evidence for depression or mood  disorders The patients weight, height, BMI have been recorded in the chart.  Hearing and vision has been addressed. I have made referrals, counseling and provided education to the patient based review of the above and I have provided the pt with a written personalized care plan for preventive services. Provider list updated - see scanned questionairre.  Reviewed preventative protocols and updated unless pt declined.    Lump of right breast    Stable exam s/p normal dx mammo/US last year.  Upcoming screening mammogram next month.    Hyperglycemia    Reviewed with patient. Encouraged decreased added sugars.    HLD (hyperlipidemia)    Chronic, stable of meds.    Relevant Medications      atenolol (TENORMIN) tablet      losartan (COZAAR) tablet      hydrochlorothiazide (HYDRODIURIL) tablet   Essential hypertension    Chronic, stable. Continue meds. Some hypokalemia noted - will decrease hctz to 12.5mg  and reassess levels in 1 mo.  Relevant Medications      atenolol (TENORMIN) tablet      losartan (COZAAR) tablet      hydrochlorothiazide (HYDRODIURIL) tablet   RESOLVED: Disorder of bone and cartilage    DEXA 2010 WNL. Will resolve problem    Advanced care planning/counseling discussion    Advanced directives: thinks wants daughter be HCPOA. Has this at home and will bring [place] a copy.        Follow up plan: Return in about 1 year (around 12/24/2014), or as needed, for annual exam, prior fasting for blood work.

## 2013-12-23 NOTE — Patient Instructions (Addendum)
Flu shot and prevnar today. Call [facility] to schedule colonoscopy. Let's decrease hydrochlorothiazide to 12.5mg  once daily (may cut current tablet in half until you run out). Return in 2-3 weeks for lab visit to recheck potassium level. Bring me a copy of your living will/health care power of attorney at your convenience. Good to see you today - you are ding very well! Return as needed or in 1 year for next wellness visit

## 2013-12-23 NOTE — Assessment & Plan Note (Addendum)
Chronic, stable. Continue meds. Some hypokalemia noted - will decrease hctz to 12.5mg  and reassess levels in 1 mo.

## 2013-12-23 NOTE — Assessment & Plan Note (Signed)

## 2013-12-23 NOTE — Assessment & Plan Note (Signed)
Chronic, stable of meds.

## 2013-12-23 NOTE — Assessment & Plan Note (Signed)
Stable exam s/p normal dx mammo/US last year.  Upcoming screening mammogram next month.

## 2013-12-23 NOTE — Assessment & Plan Note (Signed)
Advanced directives: thinks wants daughter be HCPOA. Has this at home and will bring us a copy. 

## 2013-12-23 NOTE — Progress Notes (Signed)
Pre visit review using our clinic review tool, if applicable. No additional management support is needed unless otherwise documented below in the visit note. 

## 2013-12-23 NOTE — Assessment & Plan Note (Signed)
Reviewed with patient. Encouraged decreased added sugars.

## 2013-12-28 NOTE — Addendum Note (Signed)
Addended by: Lurlean Nanny on: 12/28/2013 09:59 AM   Modules accepted: Orders

## 2014-01-06 ENCOUNTER — Encounter: Payer: Self-pay | Admitting: *Deleted

## 2014-01-06 ENCOUNTER — Ambulatory Visit
Admission: RE | Admit: 2014-01-06 | Discharge: 2014-01-06 | Disposition: A | Discharge status: Home / Self Care | Payer: Medicare Other | Source: Ambulatory Visit

## 2014-01-06 ENCOUNTER — Other Ambulatory Visit (INDEPENDENT_AMBULATORY_CARE_PROVIDER_SITE_OTHER): Payer: Medicare Other

## 2014-01-06 DIAGNOSIS — I10 Essential (primary) hypertension: Secondary | ICD-10-CM | POA: Diagnosis not present

## 2014-01-06 DIAGNOSIS — Z1231 Encounter for screening mammogram for malignant neoplasm of breast: Secondary | ICD-10-CM

## 2014-01-06 LAB — HM MAMMOGRAPHY: HM Mammogram: NORMAL

## 2014-01-08 LAB — BASIC METABOLIC PANEL
BUN: 12 mg/dL (ref 6–23)
CHLORIDE: 101 meq/L (ref 96–112)
CO2: 30 mEq/L (ref 19–32)
CREATININE: 0.8 mg/dL (ref 0.4–1.2)
Calcium: 9.2 mg/dL (ref 8.4–10.5)
GFR: 77.62 mL/min (ref >60.00)
Glucose, Bld: 82 mg/dL (ref 70–99)
POTASSIUM: 3.9 meq/L (ref 3.5–5.1)
Sodium: 138 mEq/L (ref 135–145)

## 2014-01-09 ENCOUNTER — Encounter: Payer: Self-pay | Admitting: *Deleted

## 2014-04-07 ENCOUNTER — Encounter: Payer: Self-pay | Admitting: Gastroenterology

## 2014-05-24 DIAGNOSIS — H35372 Puckering of macula, left eye: Secondary | ICD-10-CM | POA: Diagnosis not present

## 2014-05-24 DIAGNOSIS — H25013 Cortical age-related cataract, bilateral: Secondary | ICD-10-CM | POA: Diagnosis not present

## 2014-05-24 DIAGNOSIS — H40023 Open angle with borderline findings, high risk, bilateral: Secondary | ICD-10-CM | POA: Diagnosis not present

## 2014-05-24 DIAGNOSIS — H2513 Age-related nuclear cataract, bilateral: Secondary | ICD-10-CM | POA: Diagnosis not present

## 2014-09-26 DIAGNOSIS — D225 Melanocytic nevi of trunk: Secondary | ICD-10-CM | POA: Diagnosis not present

## 2014-09-26 DIAGNOSIS — L570 Actinic keratosis: Secondary | ICD-10-CM | POA: Diagnosis not present

## 2014-09-26 DIAGNOSIS — L821 Other seborrheic keratosis: Secondary | ICD-10-CM | POA: Diagnosis not present

## 2014-10-20 ENCOUNTER — Ambulatory Visit (INDEPENDENT_AMBULATORY_CARE_PROVIDER_SITE_OTHER): Payer: Medicare Other | Admitting: Family Medicine

## 2014-10-20 ENCOUNTER — Encounter: Payer: Self-pay | Admitting: Family Medicine

## 2014-10-20 VITALS — BP 140/72 | HR 56 | Temp 98.5°F | Wt 161.5 lb

## 2014-10-20 DIAGNOSIS — I10 Essential (primary) hypertension: Secondary | ICD-10-CM

## 2014-10-20 DIAGNOSIS — Z23 Encounter for immunization: Secondary | ICD-10-CM

## 2014-10-20 DIAGNOSIS — G4700 Insomnia, unspecified: Secondary | ICD-10-CM | POA: Diagnosis not present

## 2014-10-20 MED ORDER — LOSARTAN POTASSIUM 50 MG PO TABS: 75.0000 mg | ORAL_TABLET | Freq: Every day | ORAL | Status: DC

## 2014-10-20 MED ORDER — TRAZODONE HCL 50 MG PO TABS: 25.0000 mg | ORAL_TABLET | Freq: Every evening | ORAL | Status: DC | PRN

## 2014-10-20 MED ORDER — ATENOLOL 25 MG PO TABS: 25.0000 mg | ORAL_TABLET | Freq: Two times a day (BID) | ORAL | Status: DC

## 2014-10-20 NOTE — Assessment & Plan Note (Signed)
Chronic, deteriorated first thing in am - ?due to sleep maintenance insomnia. Will change antihypertensive regimen to atenolol 25mg  bid (due to chronic bradycardia noted), increase losartan to 75mg  nightly, continue hctz 12.5mg  daily. Pt agrees with plan.

## 2014-10-20 NOTE — Patient Instructions (Signed)
Flu shot today Trial trazodone for sleep 1/2 - 1 tablet nightly.  Avoid night time naps. Decrease atenolol to 25mg  twice daily. Increase losartan to 75mg  nightly. New dose sent to [facility].  Insomnia Insomnia is frequent trouble falling and/or staying asleep. Insomnia can be a long term problem or a short term problem. Both are common. Insomnia can be a short term problem when the wakefulness is related to a certain stress or worry. Long term insomnia is often related to ongoing stress during waking hours and/or poor sleeping habits. Overtime, sleep deprivation itself can make the problem worse. Every little thing feels more severe because you are overtired and your ability to cope is decreased. CAUSES   Stress, anxiety, and depression.  Poor sleeping habits.  Distractions such as TV in the bedroom.  Naps close to bedtime.  Engaging in emotionally charged conversations before bed.  Technical reading before sleep.  Alcohol and other sedatives. They may make the problem worse. They can hurt normal sleep patterns and normal dream activity.  Stimulants such as caffeine for several hours prior to bedtime.  Pain syndromes and shortness of breath can cause insomnia.  Exercise late at night.  Changing time zones may cause sleeping problems (jet lag). It is sometimes helpful to have someone observe your sleeping patterns. They should look for periods of not breathing during the night (sleep apnea). They should also look to see how long those periods last. If you live alone or observers are uncertain, you can also be observed at a sleep clinic where your sleep patterns will be professionally monitored. Sleep apnea requires a checkup and treatment. Give your caregivers your medical history. Give your caregivers observations your family has made about your sleep.  SYMPTOMS   Not feeling rested in the morning.  Anxiety and restlessness at bedtime.  Difficulty falling and staying  asleep. TREATMENT   Your caregiver may prescribe treatment for an underlying medical disorders. Your caregiver can give advice or help if you are using alcohol or other drugs for self-medication. Treatment of underlying problems will usually eliminate insomnia problems.  Medications can be prescribed for short time use. They are generally not recommended for lengthy use.  Over-the-counter sleep medicines are not recommended for lengthy use. They can be habit forming.  You can promote easier sleeping by making lifestyle changes such as:  Using relaxation techniques that help with breathing and reduce muscle tension.  Exercising earlier in the day.  Changing your diet and the time of your last meal. No night time snacks.  Establish a regular time to go to bed.  Counseling can help with stressful problems and worry.  Soothing music and white noise may be helpful if there are background noises you cannot remove.  Stop tedious detailed work at least one hour before bedtime. HOME CARE INSTRUCTIONS   Keep a diary. Inform your caregiver about your progress. This includes any medication side effects. See your caregiver regularly. Take note of:  Times when you are asleep.  Times when you are awake during the night.  The quality of your sleep.  How you feel the next day. This information will help your caregiver care for you.  Get out of bed if you are still awake after 15 minutes. Read or do some quiet activity. Keep the lights down. Wait until you feel sleepy and go back to bed.  Keep regular sleeping and waking hours. Avoid naps.  Exercise regularly.  Avoid distractions at bedtime. Distractions include watching television or  engaging in any intense or detailed activity like attempting to balance the household checkbook.  Develop a bedtime ritual. Keep a familiar routine of bathing, brushing your teeth, climbing into bed at the same time each night, listening to soothing music.  Routines increase the success of falling to sleep faster.  Use relaxation techniques. This can be using breathing and muscle tension release routines. It can also include visualizing peaceful scenes. You can also help control troubling or intruding thoughts by keeping your mind occupied with boring or repetitive thoughts like the old concept of counting sheep. You can make it more creative like imagining planting one beautiful flower after another in your backyard garden.  During your day, work to eliminate stress. When this is not possible use some of the previous suggestions to help reduce the anxiety that accompanies stressful situations. MAKE SURE YOU:   Understand these instructions.  Will watch your condition.  Will get help right away if you are not doing well or get worse. Document Released: 01/18/2000 Document Revised: 04/14/2011 Document Reviewed: 02/17/2007 [facility] Patient Information 2015 [place], [place]. This information is not intended to replace advice given to you by your health care provider. Make sure you discuss any questions you have with your health care provider.

## 2014-10-20 NOTE — Assessment & Plan Note (Signed)
Sleep maintenance insomnia - rec against non-benzo hypnotics. Trial trazodone 25-50mg  nightly for sleep. Discussed sleep hygiene (specifically avoiding night time naps in front of TV).

## 2014-10-20 NOTE — Progress Notes (Signed)
BP 140/72 mmHg  Pulse 56  Temp(Src) 98.5 F (36.9 C) (Oral)  Wt 161 lb 8 oz (73.256 kg)  SpO2 98%   CC: BP check  Subjective:    Patient ID: Kristine Owens, female    DOB: [birth year], 81 y.o.   MRN: [id number]  HPI: Kristine Owens is a 81 y.o. female presenting on 10/20/2014 for Hypertension   HTN - Compliant with current antihypertensive regimen of atenolol 50mg  bid (7am and 11pm), hctz 12.5mg  daily, losartan 50mg  daily.  Does check blood pressures at home: 180/90 first thing in am, then better control later in the day. No low blood pressure readings or symptoms of dizziness/syncope.  Denies HA, vision changes, CP/tightness, SOB, leg swelling.   Sleep maintenance insomnia - bedtime is 11pm. Wakes up at 2am and unable to get back to sleep. Does take late night time naps in front of TV. Asks about Lorrin Mais - she has used this well in the past. Sonata caused nightmares. Has tried melatonin - didn't like effect.   Relevant past medical, surgical, family and social history reviewed and updated as indicated. Interim medical history since our last visit reviewed. Allergies and medications reviewed and updated. Current Outpatient Prescriptions on File Prior to Visit  Medication Sig  . b complex vitamins tablet Take 1 tablet by mouth daily.    . cholecalciferol (VITAMIN D) 1000 UNITS tablet Take 1,000 Units by mouth daily.    . hydrochlorothiazide (HYDRODIURIL) 12.5 MG tablet Take 1 tablet (12.5 mg total) by mouth daily.  . Multiple Vitamin (MULTIVITAMIN) tablet Take 1 tablet by mouth daily.    Marland Kitchen omeprazole (PRILOSEC OTC) 20 MG tablet Take 20 mg by mouth as needed.     No current facility-administered medications on file prior to visit.    Review of Systems Per HPI unless specifically indicated above     Objective:    BP 140/72 mmHg  Pulse 56  Temp(Src) 98.5 F (36.9 C) (Oral)  Wt 161 lb 8 oz (73.256 kg)  SpO2 98%  Wt Readings from Last 3 Encounters:  10/20/14 161 lb  8 oz (73.256 kg)  12/23/13 163 lb (73.936 kg)  12/14/12 166 lb 4 oz (75.411 kg)    Physical Exam  Constitutional: She appears well-developed and well-nourished. No distress.  HENT:  Mouth/Throat: Oropharynx is clear and moist. No oropharyngeal exudate.  Cardiovascular: Normal rate, regular rhythm, normal heart sounds and intact distal pulses.   No murmur heard. Pulmonary/Chest: Effort normal and breath sounds normal. No respiratory distress. She has no wheezes. She has no rales.  Psychiatric: She has a normal mood and affect.  Nursing note and vitals reviewed.  Results for orders placed or performed in visit on 01/06/14  HM MAMMOGRAPHY  Result Value Ref Range   HM Mammogram Normal Birads 1-Repeat 1 year       Assessment & Plan:   Problem List Items Addressed This Visit    Essential hypertension - Primary    Chronic, deteriorated first thing in am - ?due to sleep maintenance insomnia. Will change antihypertensive regimen to atenolol 25mg  bid (due to chronic bradycardia noted), increase losartan to 75mg  nightly, continue hctz 12.5mg  daily. Pt agrees with plan.      Relevant Medications   atenolol (TENORMIN) 25 MG tablet   losartan (COZAAR) 50 MG tablet   Insomnia    Sleep maintenance insomnia - rec against non-benzo hypnotics. Trial trazodone 25-50mg  nightly for sleep. Discussed sleep hygiene (specifically avoiding night time naps in  front of TV).          Follow up plan: Return if symptoms worsen or fail to improve.

## 2014-10-20 NOTE — Progress Notes (Signed)
Pre visit review using our clinic review tool, if applicable. No additional management support is needed unless otherwise documented below in the visit note. 

## 2014-10-20 NOTE — Addendum Note (Signed)
Addended by: Lurlean Nanny on: 10/20/2014 10:57 AM   Modules accepted: Orders

## 2014-11-14 ENCOUNTER — Ambulatory Visit (INDEPENDENT_AMBULATORY_CARE_PROVIDER_SITE_OTHER): Payer: Medicare Other | Admitting: Ophthalmology

## 2014-11-14 DIAGNOSIS — H35033 Hypertensive retinopathy, bilateral: Secondary | ICD-10-CM | POA: Diagnosis not present

## 2014-11-14 DIAGNOSIS — D3132 Benign neoplasm of left choroid: Secondary | ICD-10-CM

## 2014-11-14 DIAGNOSIS — H35341 Macular cyst, hole, or pseudohole, right eye: Secondary | ICD-10-CM

## 2014-11-14 DIAGNOSIS — I10 Essential (primary) hypertension: Secondary | ICD-10-CM

## 2014-11-14 DIAGNOSIS — H43813 Vitreous degeneration, bilateral: Secondary | ICD-10-CM | POA: Diagnosis not present

## 2014-11-27 ENCOUNTER — Telehealth: Payer: Self-pay | Admitting: *Deleted

## 2014-11-27 NOTE — Telephone Encounter (Signed)
Filled and in Kim's box. 

## 2014-11-27 NOTE — Telephone Encounter (Signed)
Rx clarification for Atenolol in your IN box for signature.

## 2014-11-28 NOTE — Telephone Encounter (Signed)
Form faxed

## 2014-12-08 ENCOUNTER — Other Ambulatory Visit: Payer: Self-pay

## 2014-12-08 DIAGNOSIS — Z1231 Encounter for screening mammogram for malignant neoplasm of breast: Secondary | ICD-10-CM

## 2014-12-19 ENCOUNTER — Other Ambulatory Visit: Payer: Self-pay | Admitting: Family Medicine

## 2014-12-19 ENCOUNTER — Encounter (INDEPENDENT_AMBULATORY_CARE_PROVIDER_SITE_OTHER): Payer: Self-pay

## 2014-12-19 ENCOUNTER — Other Ambulatory Visit (INDEPENDENT_AMBULATORY_CARE_PROVIDER_SITE_OTHER): Payer: Medicare Other

## 2014-12-19 DIAGNOSIS — K219 Gastro-esophageal reflux disease without esophagitis: Secondary | ICD-10-CM

## 2014-12-19 DIAGNOSIS — R739 Hyperglycemia, unspecified: Secondary | ICD-10-CM

## 2014-12-19 DIAGNOSIS — E785 Hyperlipidemia, unspecified: Secondary | ICD-10-CM

## 2014-12-19 DIAGNOSIS — I10 Essential (primary) hypertension: Secondary | ICD-10-CM

## 2014-12-19 LAB — LIPID PANEL
Cholesterol: 218 mg/dL — ABNORMAL HIGH (ref 0–200)
HDL: 74.7 mg/dL (ref >39.00)
LDL Cholesterol: 121 mg/dL — ABNORMAL HIGH (ref 0–99)
NONHDL: 143.22
TRIGLYCERIDES: 110 mg/dL (ref 0.0–149.0)
Total CHOL/HDL Ratio: 3
VLDL: 22 mg/dL (ref 0.0–40.0)

## 2014-12-19 LAB — MAGNESIUM: Magnesium: 1.9 mg/dL (ref 1.5–2.5)

## 2014-12-19 LAB — BASIC METABOLIC PANEL
BUN: 18 mg/dL (ref 6–23)
CHLORIDE: 99 meq/L (ref 96–112)
CO2: 32 meq/L (ref 19–32)
CREATININE: 0.82 mg/dL (ref 0.40–1.20)
Calcium: 9.6 mg/dL (ref 8.4–10.5)
GFR: 70.94 mL/min (ref >60.00)
Glucose, Bld: 115 mg/dL — ABNORMAL HIGH (ref 70–99)
POTASSIUM: 3.5 meq/L (ref 3.5–5.1)
Sodium: 138 mEq/L (ref 135–145)

## 2014-12-26 ENCOUNTER — Encounter: Payer: Self-pay | Admitting: Family Medicine

## 2014-12-26 ENCOUNTER — Ambulatory Visit (INDEPENDENT_AMBULATORY_CARE_PROVIDER_SITE_OTHER): Payer: Medicare Other | Admitting: Family Medicine

## 2014-12-26 VITALS — BP 128/64 | HR 65 | Temp 97.5°F | Ht 65.75 in | Wt 163.5 lb

## 2014-12-26 DIAGNOSIS — R7303 Prediabetes: Secondary | ICD-10-CM

## 2014-12-26 DIAGNOSIS — I10 Essential (primary) hypertension: Secondary | ICD-10-CM

## 2014-12-26 DIAGNOSIS — Z0000 Encounter for general adult medical examination without abnormal findings: Secondary | ICD-10-CM

## 2014-12-26 DIAGNOSIS — E785 Hyperlipidemia, unspecified: Secondary | ICD-10-CM

## 2014-12-26 DIAGNOSIS — G4700 Insomnia, unspecified: Secondary | ICD-10-CM

## 2014-12-26 DIAGNOSIS — K219 Gastro-esophageal reflux disease without esophagitis: Secondary | ICD-10-CM

## 2014-12-26 DIAGNOSIS — Z7189 Other specified counseling: Secondary | ICD-10-CM

## 2014-12-26 MED ORDER — HYDROCHLOROTHIAZIDE 12.5 MG PO TABS: 12.5000 mg | ORAL_TABLET | Freq: Every day | ORAL | Status: DC

## 2014-12-26 MED ORDER — ATENOLOL 25 MG PO TABS: 25.0000 mg | ORAL_TABLET | Freq: Two times a day (BID) | ORAL | Status: DC

## 2014-12-26 MED ORDER — LOSARTAN POTASSIUM 50 MG PO TABS: 75.0000 mg | ORAL_TABLET | Freq: Every day | ORAL | Status: DC

## 2014-12-26 NOTE — Assessment & Plan Note (Signed)
Chronic, stable. Continue current regimen. 

## 2014-12-26 NOTE — Assessment & Plan Note (Signed)

## 2014-12-26 NOTE — Assessment & Plan Note (Signed)
Advanced directives: thinks wants daughter be HCPOA. Has this at home and will bring us a copy. 

## 2014-12-26 NOTE — Assessment & Plan Note (Signed)
Chronic, minimal off meds.  

## 2014-12-26 NOTE — Assessment & Plan Note (Signed)
Stable off meds. Did not like effect of trazodone.

## 2014-12-26 NOTE — Progress Notes (Signed)
BP 128/64 mmHg  Pulse 65  Temp(Src) 97.5 F (36.4 C) (Oral)  Ht 5' 5.75" (1.67 m)  Wt 163 lb 8 oz (74.163 kg)  BMI 26.59 kg/m2   CC: medicare wellness visit  Subjective:    Patient ID: Kristine Owens, female    DOB: 06/01/1930, 81 y.o.   MRN: [id number]  HPI: Kristine Owens is a 81 y.o. female presenting on 12/26/2014 for Medicare Wellness   Not taking trazodone for insomnia.  Ok hearing screen today.  Eye exam with Dr Herbert Deaner yearly. 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 No sadness No falls in last year.  Preventative: Colon cancer screening - 2010 Ardis Hughs) normal, some ext hemorrhoids. Given h/o adenomatous polyps, rec rpt in 5 yrs.  Mammogram - normal 01/2014. Does self breast exams at home. Cervical cancer screening - s/p hysterectomy. Aged out. DEXA - normal 2010.  Flu shot - yearly Pneumovax - 2003. prevnar 2015 Td - 2013 Shingles - 2011 Advanced directives: thinks wants daughter be HCPOA. Has this at home and will bring [place] a copy. Seat belt use discussed Sunscreen use discussed. No changing moles. Sees derm yearly.  Married/remarried Lives with husband. 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 Interim medical history since our last visit reviewed. Allergies and medications reviewed and updated. Current Outpatient Prescriptions on File Prior to Visit  Medication Sig  . b complex vitamins tablet Take 1 tablet by mouth daily.    . cholecalciferol (VITAMIN D) 1000 UNITS tablet Take 1,000 Units by mouth daily.    . Multiple Vitamin (MULTIVITAMIN) tablet Take 1 tablet by mouth daily.    Marland Kitchen omeprazole (PRILOSEC OTC) 20 MG tablet Take 20 mg by mouth as needed.     No current  facility-administered medications on file prior to visit.    Review of Systems Per HPI unless specifically indicated in ROS section     Objective:    BP 128/64 mmHg  Pulse 65  Temp(Src) 97.5 F (36.4 C) (Oral)  Ht 5' 5.75" (1.67 m)  Wt 163 lb 8 oz (74.163 kg)  BMI 26.59 kg/m2  Wt Readings from Last 3 Encounters:  12/26/14 163 lb 8 oz (74.163 kg)  10/20/14 161 lb 8 oz (73.256 kg)  12/23/13 163 lb (73.936 kg)    Physical Exam  Constitutional: She is oriented to person, place, and time. She appears well-developed and well-nourished. No distress.  HENT:  Head: Normocephalic and atraumatic.  Right Ear: Hearing, tympanic membrane, external ear and ear canal normal.  Left Ear: Hearing, tympanic membrane, external ear and ear canal normal.  Nose: Nose normal.  Mouth/Throat: Uvula is midline, oropharynx is clear and moist and mucous membranes are normal. No oropharyngeal exudate, posterior oropharyngeal edema or posterior oropharyngeal erythema.  Eyes: Conjunctivae and EOM are normal. Pupils are equal, round, and reactive to light. No scleral icterus.  Neck: Normal range of motion. Neck supple. Carotid bruit is not present. No thyromegaly present.  Cardiovascular: Normal rate, regular rhythm, normal heart sounds and intact distal pulses.   No murmur heard. Pulses:      Radial pulses are 2+ on the right side, and 2+ on the left side.  Pulmonary/Chest: Effort normal and breath sounds normal. No respiratory distress. She has no wheezes. She has no  rales.  Abdominal: Soft. Bowel sounds are normal. She exhibits no distension and no mass. There is no tenderness. There is no rebound and no guarding.  Musculoskeletal: Normal range of motion. She exhibits no edema.  Lymphadenopathy:    She has no cervical adenopathy.  Neurological: She is alert and oriented to person, place, and time.  CN grossly intact, station and gait intact Recall 3/3  Calculation 5/5 serial 3  Skin: Skin is warm and  dry. No rash noted.  Psychiatric: She has a normal mood and affect. Her behavior is normal. Judgment and thought content normal.  Nursing note and vitals reviewed.  Results for orders placed or performed in visit on 12/19/14  Lipid panel  Result Value Ref Range   Cholesterol 218 (H) 0 - 200 mg/dL   Triglycerides 110.0 0.0 - 149.0 mg/dL   HDL 74.70 >39.00 mg/dL   VLDL 22.0 0.0 - 40.0 mg/dL   LDL Cholesterol 121 (H) 0 - 99 mg/dL   Total CHOL/HDL Ratio 3    NonHDL [id number]   Basic metabolic panel  Result Value Ref Range   Sodium 138 135 - 145 mEq/L   Potassium 3.5 3.5 - 5.1 mEq/L   Chloride 99 96 - 112 mEq/L   CO2 32 19 - 32 mEq/L   Glucose, Bld 115 (H) 70 - 99 mg/dL   BUN 18 6 - 23 mg/dL   Creatinine, Ser 0.82 0.40 - 1.20 mg/dL   Calcium 9.6 8.4 - 10.5 mg/dL   GFR 70.94 >60.00 mL/min  Magnesium  Result Value Ref Range   Magnesium 1.9 1.5 - 2.5 mg/dL      Assessment & Plan:   Problem List Items Addressed This Visit    Prediabetes    Reviewed #s with patient, encouraged avoiding added sugars and simple carbs.      Medicare annual wellness visit, subsequent - Primary    I have personally reviewed the Medicare Annual Wellness questionnaire and have noted 1. The patient's medical and social history 2. Their use of alcohol, tobacco or illicit drugs 3. Their current medications and supplements 4. The patient's functional ability including ADL's, fall risks, home safety risks and hearing or visual impairment. Cognitive function has been assessed and addressed as indicated.  5. Diet and physical activity 6. Evidence for depression or mood disorders The patients weight, height, BMI have been recorded in the chart. I have made referrals, counseling and provided education to the patient based on review of the above and I have provided the pt with a written personalized care plan for preventive services. Provider list updated.. See scanned questionairre as needed for further  documentation. Reviewed preventative protocols and updated unless pt declined.       Insomnia    Stable off meds. Did not like effect of trazodone.       HLD (hyperlipidemia)    Chronic, minimal off meds.      Relevant Medications   hydrochlorothiazide (HYDRODIURIL) 12.5 MG tablet   losartan (COZAAR) 50 MG tablet   atenolol (TENORMIN) 25 MG tablet   GERD    Continue omeprazole 20mg  daily prn.      Essential hypertension    Chronic, stable. Continue current regimen.      Relevant Medications   hydrochlorothiazide (HYDRODIURIL) 12.5 MG tablet   losartan (COZAAR) 50 MG tablet   atenolol (TENORMIN) 25 MG tablet   Advanced care planning/counseling discussion    Advanced directives: thinks wants daughter be HCPOA. Has this at home and will  bring [place] a copy.          Follow up plan: Return in about 1 year (around 12/26/2015), or as needed, for medicare wellness visit.

## 2014-12-26 NOTE — Assessment & Plan Note (Signed)
Continue omeprazole 20mg  daily prn.

## 2014-12-26 NOTE — Assessment & Plan Note (Signed)
Reviewed #s with patient, encouraged avoiding added sugars and simple carbs.

## 2014-12-26 NOTE — Patient Instructions (Addendum)
Call to schedule follow up with Dr Ardis Hughs.  Bring [place] copy of living will. Return as needed or in 1 year for next medicare wellness visit  Health Maintenance, Female Adopting a healthy lifestyle and getting preventive care can go a long way to promote health and wellness. Talk with your health care provider about what schedule of regular examinations is right for you. This is a good chance for you to check in with your provider about disease prevention and staying healthy. In between checkups, there are plenty of things you can do on your own. Experts have done a lot of research about which lifestyle changes and preventive measures are most likely to keep you healthy. Ask your health care provider for more information. WEIGHT AND DIET  Eat a healthy diet  Be sure to include plenty of vegetables, fruits, low-fat dairy products, and lean protein.  Do not eat a lot of foods high in solid fats, added sugars, or salt.  Get regular exercise. This is one of the most important things you can do for your health.  Most adults should exercise for at least 150 minutes each week. The exercise should increase your heart rate and make you sweat (moderate-intensity exercise).  Most adults should also do strengthening exercises at least twice a week. This is in addition to the moderate-intensity exercise.  Maintain a healthy weight  Body mass index (BMI) is a measurement that can be used to identify possible weight problems. It estimates body fat based on height and weight. Your health care provider can help determine your BMI and help you achieve or maintain a healthy weight.  For females 25 years of age and older:   A BMI below 18.5 is considered underweight.  A BMI of 18.5 to 24.9 is normal.  A BMI of 25 to 29.9 is considered overweight.  A BMI of 30 and above is considered obese.  Watch levels of cholesterol and blood lipids  You should start having your blood tested for lipids and cholesterol  at 79 years of age, then have this test every 5 years.  You may need to have your cholesterol levels checked more often if:  Your lipid or cholesterol levels are high.  You are older than 79 years of age.  You are at high risk for heart disease.  CANCER SCREENING   Lung Cancer  Lung cancer screening is recommended for adults 31-1 years old who are at high risk for lung cancer because of a history of smoking.  A yearly low-dose CT scan of the lungs is recommended for people who:  Currently smoke.  Have quit within the past 15 years.  Have at least a 30-pack-year history of smoking. A pack year is smoking an average of one pack of cigarettes a day for 1 year.  Yearly screening should continue until it has been 15 years since you quit.  Yearly screening should stop if you develop a health problem that would prevent you from having lung cancer treatment.  Breast Cancer  Practice breast self-awareness. This means understanding how your breasts normally appear and feel.  It also means doing regular breast self-exams. Let your health care provider know about any changes, no matter how small.  If you are in your 20s or 30s, you should have a clinical breast exam (CBE) by a health care provider every 1-3 years as part of a regular health exam.  If you are 19 or older, have a CBE every year. Also consider having  a breast X-ray (mammogram) every year.  If you have a family history of breast cancer, talk to your health care provider about genetic screening.  If you are at high risk for breast cancer, talk to your health care provider about having an MRI and a mammogram every year.  Breast cancer gene (BRCA) assessment is recommended for women who have family members with BRCA-related cancers. BRCA-related cancers include:  Breast.  Ovarian.  Tubal.  Peritoneal cancers.  Results of the assessment will determine the need for genetic counseling and BRCA1 and BRCA2  testing. Cervical Cancer Your health care provider may recommend that you be screened regularly for cancer of the pelvic organs (ovaries, uterus, and vagina). This screening involves a pelvic examination, including checking for microscopic changes to the surface of your cervix (Pap test). You may be encouraged to have this screening done every 3 years, beginning at age 21.  For women ages 30-65, health care providers may recommend pelvic exams and Pap testing every 3 years, or they may recommend the Pap and pelvic exam, combined with testing for human papilloma virus (HPV), every 5 years. Some types of HPV increase your risk of cervical cancer. Testing for HPV may also be done on women of any age with unclear Pap test results.  Other health care providers may not recommend any screening for nonpregnant women who are considered low risk for pelvic cancer and who do not have symptoms. Ask your health care provider if a screening pelvic exam is right for you.  If you have had past treatment for cervical cancer or a condition that could lead to cancer, you need Pap tests and screening for cancer for at least 20 years after your treatment. If Pap tests have been discontinued, your risk factors (such as having a new sexual partner) need to be reassessed to determine if screening should resume. Some women have medical problems that increase the chance of getting cervical cancer. In these cases, your health care provider may recommend more frequent screening and Pap tests. Colorectal Cancer  This type of cancer can be detected and often prevented.  Routine colorectal cancer screening usually begins at 79 years of age and continues through 79 years of age.  Your health care provider may recommend screening at an earlier age if you have risk factors for colon cancer.  Your health care provider may also recommend using home test kits to check for hidden blood in the stool.  A small camera at the end of a  tube can be used to examine your colon directly (sigmoidoscopy or colonoscopy). This is done to check for the earliest forms of colorectal cancer.  Routine screening usually begins at age 50.  Direct examination of the colon should be repeated every 5-10 years through 79 years of age. However, you may need to be screened more often if early forms of precancerous polyps or small growths are found. Skin Cancer  Check your skin from head to toe regularly.  Tell your health care provider about any new moles or changes in moles, especially if there is a change in a mole's shape or color.  Also tell your health care provider if you have a mole that is larger than the size of a pencil eraser.  Always use sunscreen. Apply sunscreen liberally and repeatedly throughout the day.  Protect yourself by wearing long sleeves, pants, a wide-brimmed hat, and sunglasses whenever you are outside. HEART DISEASE, DIABETES, AND HIGH BLOOD PRESSURE   High blood pressure   causes heart disease and increases the risk of stroke. High blood pressure is more likely to develop in:  People who have blood pressure in the high end of the normal range (130-139/85-89 mm Hg).  People who are overweight or obese.  People who are African American.  If you are 18-39 years of age, have your blood pressure checked every 3-5 years. If you are 40 years of age or older, have your blood pressure checked every year. You should have your blood pressure measured twice--once when you are at a hospital or clinic, and once when you are not at a hospital or clinic. Record the average of the two measurements. To check your blood pressure when you are not at a hospital or clinic, you can use:  An automated blood pressure machine at a pharmacy.  A home blood pressure monitor.  If you are between 55 years and 79 years old, ask your health care provider if you should take aspirin to prevent strokes.  Have regular diabetes screenings. This  involves taking a blood sample to check your fasting blood sugar level.  If you are at a normal weight and have a low risk for diabetes, have this test once every three years after 79 years of age.  If you are overweight and have a high risk for diabetes, consider being tested at a younger age or more often. PREVENTING INFECTION  Hepatitis B  If you have a higher risk for hepatitis B, you should be screened for this virus. You are considered at high risk for hepatitis B if:  You were born in a country where hepatitis B is common. Ask your health care provider which countries are considered high risk.  Your parents were born in a high-risk country, and you have not been immunized against hepatitis B (hepatitis B vaccine).  You have HIV or AIDS.  You use needles to inject street drugs.  You live with someone who has hepatitis B.  You have had sex with someone who has hepatitis B.  You get hemodialysis treatment.  You take certain medicines for conditions, including cancer, organ transplantation, and autoimmune conditions. Hepatitis C  Blood testing is recommended for:  Everyone born from 1945 through 1965.  Anyone with known risk factors for hepatitis C. Sexually transmitted infections (STIs)  You should be screened for sexually transmitted infections (STIs) including gonorrhea and chlamydia if:  You are sexually active and are younger than 79 years of age.  You are older than 79 years of age and your health care provider tells you that you are at risk for this type of infection.  Your sexual activity has changed since you were last screened and you are at an increased risk for chlamydia or gonorrhea. Ask your health care provider if you are at risk.  If you do not have HIV, but are at risk, it may be recommended that you take a prescription medicine daily to prevent HIV infection. This is called pre-exposure prophylaxis (PrEP). You are considered at risk if:  You are  sexually active and do not regularly use condoms or know the HIV status of your partner(s).  You take drugs by injection.  You are sexually active with a partner who has HIV. Talk with your health care provider about whether you are at high risk of being infected with HIV. If you choose to begin PrEP, you should first be tested for HIV. You should then be tested every 3 months for as long as you   are taking PrEP.  PREGNANCY   If you are premenopausal and you may become pregnant, ask your health care provider about preconception counseling.  If you may become pregnant, take 400 to 800 micrograms (mcg) of folic acid every day.  If you want to prevent pregnancy, talk to your health care provider about birth control (contraception). OSTEOPOROSIS AND MENOPAUSE   Osteoporosis is a disease in which the bones lose minerals and strength with aging. This can result in serious bone fractures. Your risk for osteoporosis can be identified using a bone density scan.  If you are 65 years of age or older, or if you are at risk for osteoporosis and fractures, ask your health care provider if you should be screened.  Ask your health care provider whether you should take a calcium or vitamin D supplement to lower your risk for osteoporosis.  Menopause may have certain physical symptoms and risks.  Hormone replacement therapy may reduce some of these symptoms and risks. Talk to your health care provider about whether hormone replacement therapy is right for you.  HOME CARE INSTRUCTIONS   Schedule regular health, dental, and eye exams.  Stay current with your immunizations.   Do not use any tobacco products including cigarettes, chewing tobacco, or electronic cigarettes.  If you are pregnant, do not drink alcohol.  If you are breastfeeding, limit how much and how often you drink alcohol.  Limit alcohol intake to no more than 1 drink per day for nonpregnant women. One drink equals 12 ounces of beer, 5  ounces of wine, or 1 ounces of hard liquor.  Do not use street drugs.  Do not share needles.  Ask your health care provider for help if you need support or information about quitting drugs.  Tell your health care provider if you often feel depressed.  Tell your health care provider if you have ever been abused or do not feel safe at home.   This information is not intended to replace advice given to you by your health care provider. Make sure you discuss any questions you have with your health care provider.   Document Released: 08/05/2010 Document Revised: 02/10/2014 Document Reviewed: 12/22/2012 Elsevier Interactive Patient Education 2016 [employer]  

## 2014-12-26 NOTE — Progress Notes (Signed)
Pre visit review using our clinic review tool, if applicable. No additional management support is needed unless otherwise documented below in the visit note. 

## 2015-01-09 ENCOUNTER — Ambulatory Visit
Admission: RE | Admit: 2015-01-09 | Discharge: 2015-01-09 | Disposition: A | Discharge status: Home / Self Care | Payer: Medicare Other | Source: Ambulatory Visit

## 2015-01-09 DIAGNOSIS — Z1231 Encounter for screening mammogram for malignant neoplasm of breast: Secondary | ICD-10-CM | POA: Diagnosis not present

## 2015-01-11 LAB — HM MAMMOGRAPHY: HM Mammogram: NORMAL

## 2015-01-12 ENCOUNTER — Encounter: Payer: Self-pay | Admitting: *Deleted

## 2015-01-17 ENCOUNTER — Encounter: Payer: Self-pay | Admitting: Gastroenterology

## 2015-05-30 DIAGNOSIS — H25013 Cortical age-related cataract, bilateral: Secondary | ICD-10-CM | POA: Diagnosis not present

## 2015-05-30 DIAGNOSIS — H40023 Open angle with borderline findings, high risk, bilateral: Secondary | ICD-10-CM | POA: Diagnosis not present

## 2015-05-30 DIAGNOSIS — H2513 Age-related nuclear cataract, bilateral: Secondary | ICD-10-CM | POA: Diagnosis not present

## 2015-05-30 DIAGNOSIS — H43813 Vitreous degeneration, bilateral: Secondary | ICD-10-CM | POA: Diagnosis not present

## 2015-08-02 ENCOUNTER — Telehealth: Payer: Self-pay | Admitting: Family Medicine

## 2015-08-02 NOTE — Telephone Encounter (Signed)
Noted  

## 2015-08-02 NOTE — Telephone Encounter (Signed)
Pt has appt 08/03/15 at 10 AM with Allie Bossier NP.

## 2015-08-02 NOTE — Telephone Encounter (Signed)
[facility] Patient Name: Kristine Owens DOB: September 17, 1930 Initial Comment Caller states might have a uti, not burning, little bit of lower back discomfort and going frequently. Nurse Assessment Guidelines Guideline Title Affirmed Question Affirmed Notes Final Disposition User Clinical Call Gaddy, RN, Felicia Comments caller says that she already spoke with another nurse who took care of her and she is coming to [facility] per her direction   Call Id: [id number]

## 2015-08-03 ENCOUNTER — Ambulatory Visit (INDEPENDENT_AMBULATORY_CARE_PROVIDER_SITE_OTHER): Payer: Medicare Other | Admitting: Primary Care

## 2015-08-03 ENCOUNTER — Encounter: Payer: Self-pay | Admitting: Primary Care

## 2015-08-03 VITALS — BP 126/76 | HR 61 | Temp 98.0°F | Ht 65.75 in | Wt 161.1 lb

## 2015-08-03 DIAGNOSIS — M545 Low back pain, unspecified: Secondary | ICD-10-CM

## 2015-08-03 LAB — POC URINALSYSI DIPSTICK (AUTOMATED)
BILIRUBIN UA: NEGATIVE
Blood, UA: NEGATIVE
Glucose, UA: NEGATIVE
KETONES UA: NEGATIVE
Leukocytes, UA: NEGATIVE
Nitrite, UA: NEGATIVE
PH UA: 6
PROTEIN UA: NEGATIVE
Urobilinogen, UA: NEGATIVE

## 2015-08-03 NOTE — Progress Notes (Signed)
Subjective:    Patient ID: Kristine Owens, female    DOB: 05-Nov-1930, 82 y.o.   MRN: [id number]  HPI  Kristine Owens is an 82 year old female who presents today with a chief complaint of urinary frequency. She also reports lower back pain. Her symptoms have been present for the past 4-5 days. Denies dysuria, hematuria, vaginal discharge, vaginal itching, fevers, abdominal pain. She's not taken anything OTC for her symptoms. Overall her symptoms are about the same from onset. She has been working to clean out her garage recently and has been lifiting heavier objects than usual. She would like to ensure she does not have a urinary tract infection.   Review of Systems  Constitutional: Negative for fever and fatigue.  Gastrointestinal: Negative for nausea.  Genitourinary: Positive for frequency. Negative for dysuria, hematuria, flank pain and vaginal discharge.  Musculoskeletal: Positive for back pain.  Neurological: Negative for numbness.       Past Medical History  Diagnosis Date  . GERD (gastroesophageal reflux disease) 02/2000  . Hyperlipemia 07/2000  . Hypertension 01/2001  . ERM OD (epiretinal membrane, right eye) 2015    also with R choroidal nevus; referred to Dr. Zigmund Daniel     Social History   Social History  . Marital Status: Married    Spouse Name: N/A  . Number of Children: 1  . Years of Education: N/A   Occupational History  . Studio//Artist//Teaches    Social History Main Topics  . Smoking status: Never Smoker   . Smokeless tobacco: Never Used  . Alcohol Use: 0.5 oz/week    1 drink(s) per week     Comment: red wine several times a week  . Drug Use: No  . Sexual Activity: Yes   Other Topics Concern  . Not on file   Social History Narrative   "[name]"   Married/remarried   Lives with husband.   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 Total abdominal hysterectomy  1981    w/BSO for fibroids (Dr. Jerene Pitch)  . Tonsillectomy      Family History  Problem Relation Age of Onset  . Hypertension Mother   . Heart disease Father     MI, CVD  . Kidney disease Father     dialysis  . Hypertension Father   . Cancer Brother     lung (smoker)  . Aneurysm Brother     of aorta X 2  . Cancer Maternal Aunt     3 aunts (breast, pancreatic)    Allergies  Allergen Reactions  . Codeine Sulfate     REACTION: NAUSEA  . Trazodone And Nefazodone Other (See Comments)    Bad dreams    Current Outpatient Prescriptions on File Prior to Visit  Medication Sig Dispense Refill  . atenolol (TENORMIN) 25 MG tablet Take 1 tablet (25 mg total) by mouth 2 (two) times daily. 180 tablet 3  . b complex vitamins tablet Take 1 tablet by mouth daily.      . cholecalciferol (VITAMIN D) 1000 UNITS tablet Take 1,000 Units by mouth daily.      . hydrochlorothiazide (HYDRODIURIL) 12.5 MG tablet Take 1 tablet (12.5 mg total) by mouth daily. 90 tablet 3  . losartan (COZAAR)  50 MG tablet Take 1.5 tablets (75 mg total) by mouth at bedtime. 135 tablet 3  . Multiple Vitamin (MULTIVITAMIN) tablet Take 1 tablet by mouth daily.      Marland Kitchen omeprazole (PRILOSEC OTC) 20 MG tablet Take 20 mg by mouth as needed.       No current facility-administered medications on file prior to visit.    BP 126/76 mmHg  Pulse 61  Temp(Src) 98 F (36.7 C) (Oral)  Ht 5' 5.75" (1.67 m)  Wt 161 lb 1.9 oz (73.084 kg)  BMI 26.21 kg/m2  SpO2 98%    Objective:   Physical Exam  Constitutional: She appears well-nourished.  Cardiovascular: Normal rate and regular rhythm.   Pulmonary/Chest: Effort normal and breath sounds normal.  Abdominal: Soft. Bowel sounds are normal. There is no tenderness. There is no CVA tenderness.  Skin: Skin is warm and dry.            Assessment & Plan:  Lumbar Strain:  Urinary frequency with low back pain x 4 days. Has been working in TEFL teacher and lifting. UA: Negative. Exam unremarkable, does not appear acutely ill. Negative straight leg raise. Suspect discomfort to back caused by increased activity at home. Encouraged her to stay hydrated with water, limit caffeine consumption. Discussed use of PRN advil, heat, and stretching to lower back. Return precautions provided.   Sheral Flow, NP

## 2015-08-03 NOTE — Addendum Note (Signed)
Addended by: Jacqualin Combes on: 08/03/2015 11:30 AM   Modules accepted: Orders, SmartSet

## 2015-08-03 NOTE — Patient Instructions (Signed)
Your urine does not show evidence of infection or abnormality which is reassuring.  I suspect your lower back pain is related to the work you've been doing in your garage.  Please notify us if your symptoms persist or if you start running fevers, experiencing burning with urination, notice blood in your urine.  Ensure you are staying hydrated with water.  It was a pleasure meeting you!

## 2015-08-03 NOTE — Progress Notes (Signed)
Pre visit review using our clinic review tool, if applicable. No additional management support is needed unless otherwise documented below in the visit note. 

## 2015-10-04 DIAGNOSIS — D225 Melanocytic nevi of trunk: Secondary | ICD-10-CM | POA: Diagnosis not present

## 2015-10-04 DIAGNOSIS — D485 Neoplasm of uncertain behavior of skin: Secondary | ICD-10-CM | POA: Diagnosis not present

## 2015-10-04 DIAGNOSIS — L570 Actinic keratosis: Secondary | ICD-10-CM | POA: Diagnosis not present

## 2015-10-04 DIAGNOSIS — L814 Other melanin hyperpigmentation: Secondary | ICD-10-CM | POA: Diagnosis not present

## 2015-10-04 DIAGNOSIS — L905 Scar conditions and fibrosis of skin: Secondary | ICD-10-CM | POA: Diagnosis not present

## 2015-10-23 DIAGNOSIS — L570 Actinic keratosis: Secondary | ICD-10-CM | POA: Diagnosis not present

## 2015-11-15 ENCOUNTER — Ambulatory Visit (INDEPENDENT_AMBULATORY_CARE_PROVIDER_SITE_OTHER): Payer: Medicare Other | Admitting: Ophthalmology

## 2015-12-07 DIAGNOSIS — L905 Scar conditions and fibrosis of skin: Secondary | ICD-10-CM | POA: Diagnosis not present

## 2015-12-07 DIAGNOSIS — L570 Actinic keratosis: Secondary | ICD-10-CM | POA: Diagnosis not present

## 2015-12-12 ENCOUNTER — Ambulatory Visit (INDEPENDENT_AMBULATORY_CARE_PROVIDER_SITE_OTHER): Payer: Medicare Other | Admitting: Ophthalmology

## 2015-12-12 DIAGNOSIS — H2513 Age-related nuclear cataract, bilateral: Secondary | ICD-10-CM | POA: Diagnosis not present

## 2015-12-12 DIAGNOSIS — D3132 Benign neoplasm of left choroid: Secondary | ICD-10-CM | POA: Diagnosis not present

## 2015-12-12 DIAGNOSIS — H35341 Macular cyst, hole, or pseudohole, right eye: Secondary | ICD-10-CM

## 2015-12-12 DIAGNOSIS — H43813 Vitreous degeneration, bilateral: Secondary | ICD-10-CM

## 2015-12-12 DIAGNOSIS — H35033 Hypertensive retinopathy, bilateral: Secondary | ICD-10-CM | POA: Diagnosis not present

## 2015-12-12 DIAGNOSIS — I10 Essential (primary) hypertension: Secondary | ICD-10-CM

## 2015-12-18 ENCOUNTER — Other Ambulatory Visit: Payer: Self-pay | Admitting: Family Medicine

## 2015-12-18 DIAGNOSIS — Z1231 Encounter for screening mammogram for malignant neoplasm of breast: Secondary | ICD-10-CM

## 2016-01-02 ENCOUNTER — Other Ambulatory Visit: Payer: Self-pay | Admitting: Family Medicine

## 2016-01-16 DIAGNOSIS — H04123 Dry eye syndrome of bilateral lacrimal glands: Secondary | ICD-10-CM | POA: Diagnosis not present

## 2016-01-16 DIAGNOSIS — H1013 Acute atopic conjunctivitis, bilateral: Secondary | ICD-10-CM | POA: Diagnosis not present

## 2016-01-16 DIAGNOSIS — H40023 Open angle with borderline findings, high risk, bilateral: Secondary | ICD-10-CM | POA: Diagnosis not present

## 2016-01-16 DIAGNOSIS — H01003 Unspecified blepharitis right eye, unspecified eyelid: Secondary | ICD-10-CM | POA: Diagnosis not present

## 2016-01-21 ENCOUNTER — Other Ambulatory Visit: Payer: Self-pay | Admitting: Family Medicine

## 2016-01-21 ENCOUNTER — Telehealth: Payer: Self-pay | Admitting: Family Medicine

## 2016-01-21 DIAGNOSIS — I10 Essential (primary) hypertension: Secondary | ICD-10-CM

## 2016-01-21 DIAGNOSIS — E785 Hyperlipidemia, unspecified: Secondary | ICD-10-CM

## 2016-01-21 DIAGNOSIS — R7303 Prediabetes: Secondary | ICD-10-CM

## 2016-01-21 NOTE — Telephone Encounter (Signed)
I called Kristine Owens to see if she would like to come for her AWV on Wed 12/20.  She is scheduled for labs that day.  I left a message on her machine

## 2016-01-21 NOTE — Telephone Encounter (Signed)
Patient scheduled appointment on 01/23/16 at 9:45.

## 2016-01-23 ENCOUNTER — Other Ambulatory Visit: Payer: Medicare Other

## 2016-01-23 ENCOUNTER — Ambulatory Visit (INDEPENDENT_AMBULATORY_CARE_PROVIDER_SITE_OTHER): Payer: Medicare Other

## 2016-01-23 VITALS — BP 132/70 | HR 53 | Temp 98.4°F | Ht 65.0 in | Wt 158.2 lb

## 2016-01-23 DIAGNOSIS — Z0000 Encounter for general adult medical examination without abnormal findings: Secondary | ICD-10-CM | POA: Diagnosis not present

## 2016-01-23 DIAGNOSIS — I10 Essential (primary) hypertension: Secondary | ICD-10-CM | POA: Diagnosis not present

## 2016-01-23 DIAGNOSIS — R7303 Prediabetes: Secondary | ICD-10-CM

## 2016-01-23 DIAGNOSIS — E785 Hyperlipidemia, unspecified: Secondary | ICD-10-CM | POA: Diagnosis not present

## 2016-01-23 LAB — COMPREHENSIVE METABOLIC PANEL
ALBUMIN: 4.1 g/dL (ref 3.5–5.2)
ALK PHOS: 63 U/L (ref 39–117)
ALT: 18 U/L (ref 0–35)
AST: 18 U/L (ref 0–37)
BILIRUBIN TOTAL: 0.5 mg/dL (ref 0.2–1.2)
BUN: 16 mg/dL (ref 6–23)
CO2: 30 mEq/L (ref 19–32)
Calcium: 9.5 mg/dL (ref 8.4–10.5)
Chloride: 100 mEq/L (ref 96–112)
Creatinine, Ser: 0.76 mg/dL (ref 0.40–1.20)
GFR: 77.23 mL/min (ref >60.00)
Glucose, Bld: 109 mg/dL — ABNORMAL HIGH (ref 70–99)
Potassium: 4.1 mEq/L (ref 3.5–5.1)
Sodium: 137 mEq/L (ref 135–145)
TOTAL PROTEIN: 6.6 g/dL (ref 6.0–8.3)

## 2016-01-23 LAB — LIPID PANEL
CHOLESTEROL: 211 mg/dL — AB (ref 0–200)
HDL: 70.2 mg/dL (ref >39.00)
LDL Cholesterol: 120 mg/dL — ABNORMAL HIGH (ref 0–99)
NonHDL: 140.39
Total CHOL/HDL Ratio: 3
Triglycerides: 104 mg/dL (ref 0.0–149.0)
VLDL: 20.8 mg/dL (ref 0.0–40.0)

## 2016-01-23 LAB — TSH: TSH: 2.19 u[IU]/mL (ref 0.35–4.50)

## 2016-01-23 LAB — HEMOGLOBIN A1C: HEMOGLOBIN A1C: 5.5 % (ref 4.6–6.5)

## 2016-01-23 NOTE — Progress Notes (Signed)
Subjective:   Kristine Owens is a 82 y.o. female who presents for Medicare Annual (Subsequent) preventive examination.  Review of Systems:  N/A Cardiac Risk Factors include: advanced age (>28men, >30 women)     Objective:     Vitals: BP 132/70 (BP Location: Left Arm, Patient Position: Sitting, Cuff Size: Normal)   Pulse (!) 53   Temp 98.4 F (36.9 C) (Oral)   Ht 5\' 5"  (1.651 m) Comment: no shoes  Wt 158 lb 4 oz (71.8 kg)   SpO2 98%   BMI 26.33 kg/m   Body mass index is 26.33 kg/m.   Tobacco History  Smoking Status  . Never Smoker  Smokeless Tobacco  . Never Used     Counseling given: Not Answered   Past Medical History:  Diagnosis Date  . ERM OD (epiretinal membrane, right eye) 2015   also with R choroidal nevus; referred to Dr. Zigmund Daniel  . GERD (gastroesophageal reflux disease) 02/2000  . Hyperlipemia 07/2000  . Hypertension 01/2001   Past Surgical History:  Procedure Laterality Date  . TONSILLECTOMY    . TOTAL ABDOMINAL HYSTERECTOMY  1981   w/BSO for fibroids (Dr. Jerene Pitch)   Family History  Problem Relation Age of Onset  . Hypertension Mother   . Heart disease Father     MI, CVD  . Kidney disease Father     dialysis  . Hypertension Father   . Cancer Brother     lung (smoker)  . Aneurysm Brother     of aorta X 2  . Cancer Maternal Aunt     3 aunts (breast, pancreatic)   History  Sexual Activity  . Sexual activity: Yes    Outpatient Encounter Prescriptions as of 01/23/2016  Medication Sig  . atenolol (TENORMIN) 25 MG tablet Take 1 tablet (25 mg total) by mouth 2 (two) times daily.  Marland Kitchen b complex vitamins tablet Take 1 tablet by mouth daily.    . cholecalciferol (VITAMIN D) 1000 UNITS tablet Take 1,000 Units by mouth daily.    . hydrochlorothiazide (HYDRODIURIL) 12.5 MG tablet TAKE 1 TABLET (12.5 MG TOTAL) BY MOUTH DAILY.  Marland Kitchen losartan (COZAAR) 50 MG tablet Take 1.5 tablets (75 mg total) by mouth at bedtime.  . Multiple Vitamin  (MULTIVITAMIN) tablet Take 1 tablet by mouth daily.    Marland Kitchen omeprazole (PRILOSEC OTC) 20 MG tablet Take 20 mg by mouth as needed.     No facility-administered encounter medications on file as of 01/23/2016.     Activities of Daily Living In your present state of health, do you have any difficulty performing the following activities: 01/23/2016  Hearing? N  Vision? Y  Difficulty concentrating or making decisions? N  Walking or climbing stairs? N  Dressing or bathing? N  Doing errands, shopping? N  Preparing Food and eating ? N  Using the Toilet? N  In the past six months, have you accidently leaked urine? N  Do you have problems with loss of bowel control? N  Managing your Medications? N  Managing your Finances? N  Housekeeping or managing your Housekeeping? N  Some recent data might be hidden    Patient Care Team: Ria Bush, MD as PCP - General (Family Medicine) Druscilla Brownie, MD as Consulting Physician (Dermatology) Hayden Pedro, MD as Consulting Physician (Ophthalmology) Monna Fam, MD as Consulting Physician (Ophthalmology)    Assessment:     Hearing Screening   125Hz  250Hz  500Hz  1000Hz  2000Hz  3000Hz  4000Hz  6000Hz  8000Hz   Right ear:  0 0 40  0    Left ear:   0 40 40  0    Vision Screening Comments: Last vision exam in 2017 with Dr. Herbert Deaner   Exercise Activities and Dietary recommendations Current Exercise Habits: Home exercise routine, Type of exercise: strength training/weights, Time (Minutes): 30, Frequency (Times/Week): 6, Weekly Exercise (Minutes/Week): 180, Intensity: Moderate  Goals    . Increase physical activity          Starting 01/23/2016, I will continue to exercise at least 30 min 6 days per week.       Fall Risk Fall Risk  01/23/2016 12/26/2014 12/23/2013 12/23/2011  Falls in the past year? Yes No No No  Number falls in past yr: 1 - - -  Injury with Fall? Yes - - -  Risk for fall due to : - Impaired vision - -  Follow up Falls  evaluation completed - - -   Depression Screen PHQ 2/9 Scores 01/23/2016 12/26/2014 12/23/2013 12/23/2011  PHQ - 2 Score 0 0 0 0     Cognitive Function MMSE - Mini Mental State Exam 01/23/2016  Orientation to time 5  Orientation to Place 5  Registration 3  Attention/ Calculation 0  Recall 3  Language- name 2 objects 0  Language- repeat 1  Language- follow 3 step command 3  Language- read & follow direction 0  Write a sentence 0  Copy design 0  Total score 20     PLEASE NOTE: A Mini-Cog screen was completed. Maximum score is 20. A value of 0 denotes this part of Folstein MMSE was not completed or the patient failed this part of the Mini-Cog screening.   Mini-Cog Screening Orientation to Time - Max 5 pts Orientation to Place - Max 5 pts Registration - Max 3 pts Recall - Max 3 pts Language Repeat - Max 1 pts Language Follow 3 Step Command - Max 3 pts     Immunization History  Administered Date(s) Administered  . Influenza Whole 12/03/2006, 11/18/2007, 11/20/2008  . Influenza,inj,Quad PF,36+ Mos 12/23/2013, 10/20/2014  . Influenza-Unspecified 11/17/2012  . Pneumococcal Conjugate-13 12/23/2013  . Pneumococcal Polysaccharide-23 11/05/2001  . Td 11/06/2000, 12/23/2011  . Zoster 12/13/2009   Screening Tests Health Maintenance  Topic Date Due  . INFLUENZA VACCINE  05/03/2016 (Originally 09/04/2015)  . MAMMOGRAM  01/09/2017 (Originally 01/11/2016)  . DTaP/Tdap/Td (1 - Tdap) 12/22/2021 (Originally 12/24/2011)  . TETANUS/TDAP  12/22/2021  . DEXA SCAN  Completed  . ZOSTAVAX  Completed  . PNA vac Low Risk Adult  Completed      Plan:     I have personally reviewed and addressed the Medicare Annual Wellness questionnaire and have noted the following in the patient's chart:  A. Medical and social history B. Use of alcohol, tobacco or illicit drugs  C. Current medications and supplements D. Functional ability and status E.  Nutritional status F.  Physical  activity G. Advance directives H. List of other physicians I.  Hospitalizations, surgeries, and ER visits in previous 12 months J.  [place] to include hearing, vision, cognitive, depression L. Referrals and appointments - none  In addition, I have reviewed and discussed with patient certain preventive protocols, quality metrics, and best practice recommendations. A written personalized care plan for preventive services as well as general preventive health recommendations were provided to patient.  See attached scanned questionnaire for additional information.   Signed,   Lindell Noe, MHA, BS, LPN Health Coach

## 2016-01-23 NOTE — Patient Instructions (Signed)
Ms. Avena , Thank you for taking time to come for your Medicare Wellness Visit. I appreciate your ongoing commitment to your health goals. Please review the following plan we discussed and let me know if I can assist you in the future.   These are the goals we discussed: Goals    . Increase physical activity          Starting 01/23/2016, I will continue to exercise at least 30 min 6 days per week.        This is a list of the screening recommended for you and due dates:  Health Maintenance  Topic Date Due  . Flu Shot  05/03/2016*  . Mammogram  01/09/2017*  . DTaP/Tdap/Td vaccine (1 - Tdap) 12/22/2021*  . Tetanus Vaccine  12/22/2021  . DEXA scan (bone density measurement)  Completed  . Shingles Vaccine  Completed  . Pneumonia vaccines  Completed  *Topic was postponed. The date shown is not the original due date.   Preventive Care for Adults  A healthy lifestyle and preventive care can promote health and wellness. Preventive health guidelines for adults include the following key practices.  . A routine yearly physical is a good way to check with your health care provider about your health and preventive screening. It is a chance to share any concerns and updates on your health and to receive a thorough exam.  . Visit your dentist for a routine exam and preventive care every 6 months. Brush your teeth twice a day and floss once a day. Good oral hygiene prevents tooth decay and gum disease.  . The frequency of eye exams is based on your age, health, family medical history, use  of contact lenses, and other factors. Follow your health care provider's ecommendations for frequency of eye exams.  . Eat a healthy diet. Foods like vegetables, fruits, whole grains, low-fat dairy products, and lean protein foods contain the nutrients you need without too many calories. Decrease your intake of foods high in solid fats, added sugars, and salt. Eat the right amount of calories for you. Get  information about a proper diet from your health care provider, if necessary.  . Regular physical exercise is one of the most important things you can do for your health. Most adults should get at least 150 minutes of moderate-intensity exercise (any activity that increases your heart rate and causes you to sweat) each week. In addition, most adults need muscle-strengthening exercises on 2 or more days a week.  Silver Sneakers may be a benefit available to you. To determine eligibility, you may visit the website: www.silversneakers.com or contact program at 808-115-5848 Mon-Fri between 8AM-8PM.   . Maintain a healthy weight. The body mass index (BMI) is a screening tool to identify possible weight problems. It provides an estimate of body fat based on height and weight. Your health care provider can find your BMI and can help you achieve or maintain a healthy weight.   For adults 20 years and older: ? A BMI below 18.5 is considered underweight. ? A BMI of 18.5 to 24.9 is normal. ? A BMI of 25 to 29.9 is considered overweight. ? A BMI of 30 and above is considered obese.   . Maintain normal blood lipids and cholesterol levels by exercising and minimizing your intake of saturated fat. Eat a balanced diet with plenty of fruit and vegetables. Blood tests for lipids and cholesterol should begin at age 69 and be repeated every 5 years. If your  lipid or cholesterol levels are high, you are over 50, or you are at high risk for heart disease, you may need your cholesterol levels checked more frequently. Ongoing high lipid and cholesterol levels should be treated with medicines if diet and exercise are not working.  . If you smoke, find out from your health care provider how to quit. If you do not use tobacco, please do not start.  . If you choose to drink alcohol, please do not consume more than 2 drinks per day. One drink is considered to be 12 ounces (355 mL) of beer, 5 ounces (148 mL) of wine, or 1.5  ounces (44 mL) of liquor.  . If you are 74-75 years old, ask your health care provider if you should take aspirin to prevent strokes.  . Use sunscreen. Apply sunscreen liberally and repeatedly throughout the day. You should seek shade when your shadow is shorter than you. Protect yourself by wearing long sleeves, pants, a wide-brimmed hat, and sunglasses year round, whenever you are outdoors.  . Once a month, do a whole body skin exam, using a mirror to look at the skin on your back. Tell your health care provider of new moles, moles that have irregular borders, moles that are larger than a pencil eraser, or moles that have changed in shape or color.

## 2016-01-23 NOTE — Progress Notes (Signed)
Pre visit review using our clinic review tool, if applicable. No additional management support is needed unless otherwise documented below in the visit note. 

## 2016-01-23 NOTE — Progress Notes (Signed)
I reviewed health advisor's note, was available for consultation, and agree with documentation and plan.  

## 2016-01-23 NOTE — Progress Notes (Signed)
PCP notes:   Health maintenance:  Flu vaccine - pt wants to get vaccine at CPE Mammogram - appt scheduled 01/24/16  Abnormal screenings:   Hearing - failed Fall risk - hx of fall with injury  Patient concerns:   None  Nurse concerns:  None  Next PCP appt:   02/01/16 @ 1030

## 2016-01-24 ENCOUNTER — Ambulatory Visit
Admission: RE | Admit: 2016-01-24 | Discharge: 2016-01-24 | Disposition: A | Discharge status: Home / Self Care | Payer: Medicare Other | Source: Ambulatory Visit | Attending: Family Medicine | Admitting: Family Medicine

## 2016-01-24 DIAGNOSIS — Z1231 Encounter for screening mammogram for malignant neoplasm of breast: Secondary | ICD-10-CM | POA: Diagnosis not present

## 2016-01-30 LAB — HM MAMMOGRAPHY

## 2016-01-31 ENCOUNTER — Encounter: Payer: Self-pay | Admitting: *Deleted

## 2016-02-01 ENCOUNTER — Ambulatory Visit (INDEPENDENT_AMBULATORY_CARE_PROVIDER_SITE_OTHER): Payer: Medicare Other | Admitting: Family Medicine

## 2016-02-01 ENCOUNTER — Encounter: Payer: Self-pay | Admitting: Family Medicine

## 2016-02-01 VITALS — BP 128/74 | HR 64 | Temp 97.7°F | Wt 158.0 lb

## 2016-02-01 DIAGNOSIS — I10 Essential (primary) hypertension: Secondary | ICD-10-CM | POA: Diagnosis not present

## 2016-02-01 DIAGNOSIS — Z23 Encounter for immunization: Secondary | ICD-10-CM | POA: Diagnosis not present

## 2016-02-01 DIAGNOSIS — K219 Gastro-esophageal reflux disease without esophagitis: Secondary | ICD-10-CM

## 2016-02-01 DIAGNOSIS — E785 Hyperlipidemia, unspecified: Secondary | ICD-10-CM

## 2016-02-01 DIAGNOSIS — R7303 Prediabetes: Secondary | ICD-10-CM

## 2016-02-01 DIAGNOSIS — Z7189 Other specified counseling: Secondary | ICD-10-CM

## 2016-02-01 DIAGNOSIS — Z1211 Encounter for screening for malignant neoplasm of colon: Secondary | ICD-10-CM

## 2016-02-01 DIAGNOSIS — Z6379 Other stressful life events affecting family and household: Secondary | ICD-10-CM

## 2016-02-01 MED ORDER — LOSARTAN POTASSIUM 50 MG PO TABS: 75.0000 mg | ORAL_TABLET | Freq: Every day | ORAL | 3 refills | Status: DC

## 2016-02-01 MED ORDER — ATENOLOL 25 MG PO TABS: 25.0000 mg | ORAL_TABLET | Freq: Two times a day (BID) | ORAL | 3 refills | Status: DC

## 2016-02-01 MED ORDER — HYDROCHLOROTHIAZIDE 12.5 MG PO TABS: 12.5000 mg | ORAL_TABLET | Freq: Every day | ORAL | 3 refills | Status: DC

## 2016-02-01 NOTE — Progress Notes (Signed)
Pre visit review using our clinic review tool, if applicable. No additional management support is needed unless otherwise documented below in the visit note. 

## 2016-02-01 NOTE — Patient Instructions (Addendum)
Flu shot today Pass by lab to pick up stool kit.  Get back to the gym. You are doing well today. Continue healthy diet and lifestyle.  Return as needed or in 1 year for next wellness visit.

## 2016-02-01 NOTE — Assessment & Plan Note (Signed)
Diet controlled. Rare need for PRN PPI.

## 2016-02-01 NOTE — Assessment & Plan Note (Signed)
Chronic, stable. Continue current regimen. 

## 2016-02-01 NOTE — Assessment & Plan Note (Signed)
Discussed husband's unfortunate diagnosis. Support provided. She has good family support. Will let me know if she feels overwhelmed.

## 2016-02-01 NOTE — Assessment & Plan Note (Signed)
A1c wonderful. cbg staying mildly elevated. Discussed avoiding too many added sugars in diet.

## 2016-02-01 NOTE — Progress Notes (Signed)
BP 128/74   Pulse 64   Temp 97.7 F (36.5 C) (Oral)   Wt 158 lb (71.7 kg)   BMI 26.29 kg/m    CC: f/u AMW visit Subjective:    Patient ID: Kristine Owens, female    DOB: [birth year], 82 y.o.   MRN: [id number]  HPI: Kristine Owens is a 82 y.o. female presenting on 02/01/2016 for Annual Exam   Tough year - husband diagnosed with creutzfeldt-jakob prion disease, sudden deterioration and last month moved into memory care nursing home. Pt has had trouble adjusting to this drastic change. Supportive family nearby.  Saw Lesia last week for medicare wellness visit, note reviewed. Failed hearing screen. She has noticed trouble as well. Declines audiology referral.   Preventative: Colon cancer screening - 2010 Ardis Hughs) normal, some ext hemorrhoids. Given h/o adenomatous polyps, rec rpt in 5 yrs.  Mammogram - birads1 01/2016. Does breast exams at home.  Well woman exam - s/p total hysterectomy, aged out.  DEXA - normal 2010.  Flu shot - yearly.  Pneumovax - 2003. prevnar 2015.  Td - 2013.  Shingles - 2011.  Advanced directives: thinks wants daughter be HCPOA. Has this at home and will bring [place] a copy. Seat belt use discussed  Sunscreen use discussed. No changing moles. Sees derm yearly  Non smoker Alcohol - 1-2 glasses wine nightly  Married/remarried Lives with husband. 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   Relevant past medical, surgical, family and social history reviewed and updated as indicated. Interim medical history since our last visit reviewed. Allergies and medications reviewed and updated. Current Outpatient Prescriptions on File Prior to Visit  Medication Sig  . b complex vitamins tablet Take 1 tablet by mouth daily.    . cholecalciferol  (VITAMIN D) 1000 UNITS tablet Take 1,000 Units by mouth daily.    . Multiple Vitamin (MULTIVITAMIN) tablet Take 1 tablet by mouth daily.    Marland Kitchen omeprazole (PRILOSEC OTC) 20 MG tablet Take 20 mg by mouth as needed.     No current facility-administered medications on file prior to visit.     Review of Systems Per HPI unless specifically indicated in ROS section     Objective:    BP 128/74   Pulse 64   Temp 97.7 F (36.5 C) (Oral)   Wt 158 lb (71.7 kg)   BMI 26.29 kg/m   Wt Readings from Last 3 Encounters:  02/01/16 158 lb (71.7 kg)  01/23/16 158 lb 4 oz (71.8 kg)  08/03/15 161 lb 1.9 oz (73.1 kg)    Physical Exam  Constitutional: She is oriented to person, place, and time. She appears well-developed and well-nourished. No distress.  HENT:  Head: Normocephalic and atraumatic.  Right Ear: Hearing, tympanic membrane, external ear and ear canal normal.  Left Ear: Hearing, tympanic membrane, external ear and ear canal normal.  Nose: Nose normal.  Mouth/Throat: Uvula is midline, oropharynx is clear and moist and mucous membranes are normal. No oropharyngeal exudate, posterior oropharyngeal edema or posterior oropharyngeal erythema.  Eyes: Conjunctivae and EOM are normal. Pupils are equal, round, and reactive to light. No scleral icterus.  Neck: Normal range of motion. Neck supple. Carotid bruit is not present. No thyromegaly present.  Cardiovascular: Normal rate, regular rhythm, normal heart sounds and intact distal  pulses.   No murmur heard. Pulses:      Radial pulses are 2+ on the right side, and 2+ on the left side.  Pulmonary/Chest: Effort normal and breath sounds normal. No respiratory distress. She has no wheezes. She has no rales.  Abdominal: Soft. Bowel sounds are normal. She exhibits no distension and no mass. There is no tenderness. There is no rebound and no guarding.  Musculoskeletal: Normal range of motion. She exhibits no edema.  Lymphadenopathy:    She has no cervical  adenopathy.  Neurological: She is alert and oriented to person, place, and time.  CN grossly intact, station and gait intact  Skin: Skin is warm and dry. No rash noted.  Psychiatric: She has a normal mood and affect. Her behavior is normal. Judgment and thought content normal.  Nursing note and vitals reviewed.  Results for orders placed or performed in visit on 01/31/16  HM MAMMOGRAPHY  Result Value Ref Range   HM Mammogram 0-4 Bi-Rad 0-4 Bi-Rad, Self Reported Normal      Assessment & Plan:   Problem List Items Addressed This Visit    Advanced care planning/counseling discussion    Advanced directives: thinks wants daughter be HCPOA. Has this at home and will bring [place] a copy.      Essential hypertension - Primary    Chronic, stable. Continue current regimen.       Relevant Medications   atenolol (TENORMIN) 25 MG tablet   losartan (COZAAR) 50 MG tablet   hydrochlorothiazide (HYDRODIURIL) 12.5 MG tablet   GERD    Diet controlled. Rare need for PRN PPI.       HLD (hyperlipidemia)    Chronic, stable off meds.       Relevant Medications   atenolol (TENORMIN) 25 MG tablet   losartan (COZAAR) 50 MG tablet   hydrochlorothiazide (HYDRODIURIL) 12.5 MG tablet   Prediabetes    A1c wonderful. cbg staying mildly elevated. Discussed avoiding too many added sugars in diet.       Stress due to illness of family member    Discussed husband's unfortunate diagnosis. Support provided. She has good family support. Will let me know if she feels overwhelmed.        Other Visit Diagnoses    Need for influenza vaccination       Relevant Orders   Flu Vaccine QUAD 36+ mos PF IM (Fluarix & Fluzone Quad PF) (Completed)   Special screening for malignant neoplasms, colon       Relevant Orders   Fecal occult blood, imunochemical       Follow up plan: Return in about 1 year (around 01/31/2017) for medicare wellness visit.  Ria Bush, MD

## 2016-02-01 NOTE — Assessment & Plan Note (Signed)
Advanced directives: thinks wants daughter be HCPOA. Has this at home and will bring us a copy. 

## 2016-02-01 NOTE — Assessment & Plan Note (Signed)
Chronic, stable off meds.  

## 2016-02-27 ENCOUNTER — Encounter: Payer: Self-pay | Admitting: Family Medicine

## 2016-02-27 DIAGNOSIS — H25013 Cortical age-related cataract, bilateral: Secondary | ICD-10-CM | POA: Diagnosis not present

## 2016-02-27 DIAGNOSIS — H40023 Open angle with borderline findings, high risk, bilateral: Secondary | ICD-10-CM | POA: Diagnosis not present

## 2016-02-27 DIAGNOSIS — H35373 Puckering of macula, bilateral: Secondary | ICD-10-CM | POA: Diagnosis not present

## 2016-02-27 DIAGNOSIS — H2513 Age-related nuclear cataract, bilateral: Secondary | ICD-10-CM | POA: Diagnosis not present

## 2016-02-27 DIAGNOSIS — H2512 Age-related nuclear cataract, left eye: Secondary | ICD-10-CM | POA: Diagnosis not present

## 2016-03-07 ENCOUNTER — Ambulatory Visit (INDEPENDENT_AMBULATORY_CARE_PROVIDER_SITE_OTHER): Payer: Medicare Other | Admitting: Family Medicine

## 2016-03-07 ENCOUNTER — Encounter: Payer: Self-pay | Admitting: Family Medicine

## 2016-03-07 VITALS — BP 140/70 | HR 64 | Temp 98.8°F | Resp 16 | Ht 65.0 in | Wt 159.0 lb

## 2016-03-07 DIAGNOSIS — J111 Influenza due to unidentified influenza virus with other respiratory manifestations: Secondary | ICD-10-CM

## 2016-03-07 MED ORDER — OSELTAMIVIR PHOSPHATE 75 MG PO CAPS: 75.0000 mg | ORAL_CAPSULE | Freq: Two times a day (BID) | ORAL | 0 refills | Status: DC

## 2016-03-07 NOTE — Progress Notes (Signed)
Pre-visit discussion using our clinic review tool. No additional management support is needed unless otherwise documented below in the visit note.  

## 2016-03-07 NOTE — Patient Instructions (Signed)
I do think you have the flu - treat with tamiflu Continue other supportive care. Continue pushing fluids and rest.    Influenza, Adult Influenza, more commonly known as "the flu," is a viral infection that primarily affects the respiratory tract. The respiratory tract includes organs that help you breathe, such as the lungs, nose, and throat. The flu causes many common cold symptoms, as well as a high fever and body aches. The flu spreads easily from person to person (is contagious). Getting a flu shot (influenza vaccination) every year is the best way to prevent influenza. What are the causes? Influenza is caused by a virus. You can catch the virus by:  Breathing in droplets from an infected person's cough or sneeze.  Touching something that was recently contaminated with the virus and then touching your mouth, nose, or eyes. What increases the risk? The following factors may make you more likely to get the flu:  Not cleaning your hands frequently with soap and water or alcohol-based hand sanitizer.  Having close contact with many people during cold and flu season.  Touching your mouth, eyes, or nose without washing or sanitizing your hands first.  Not drinking enough fluids or not eating a healthy diet.  Not getting enough sleep or exercise.  Being under a high amount of stress.  Not getting a yearly (annual) flu shot. You may be at a higher risk of complications from the flu, such as a severe lung infection (pneumonia), if you:  Are over the age of 48.  Are pregnant.  Have a weakened disease-fighting system (immune system). You may have a weakened immune system if you:  Have HIV or AIDS.  Are undergoing chemotherapy.  Aretaking medicines that reduce the activity of (suppress) the immune system.  Have a long-term (chronic) illness, such as heart disease, kidney disease, diabetes, or lung disease.  Have a liver disorder.  Are obese.  Have anemia. What are the  signs or symptoms? Symptoms of this condition typically last 4-10 days and may include:  Fever.  Chills.  Headache, body aches, or muscle aches.  Sore throat.  Cough.  Runny or congested nose.  Chest discomfort and cough.  Poor appetite.  Weakness or tiredness (fatigue).  Dizziness.  Nausea or vomiting. How is this diagnosed? This condition may be diagnosed based on your medical history and a physical exam. Your health care provider may do a nose or throat swab test to confirm the diagnosis. How is this treated? If influenza is detected early, you can be treated with antiviral medicine that can reduce the length of your illness and the severity of your symptoms. This medicine may be given by mouth (orally) or through an IV tube that is inserted in one of your veins. The goal of treatment is to relieve symptoms by taking care of yourself at home. This may include taking over-the-counter medicines, drinking plenty of fluids, and adding humidity to the air in your home. In some cases, influenza goes away on its own. Severe influenza or complications from influenza may be treated in a hospital. Follow these instructions at home:  Take over-the-counter and prescription medicines only as told by your health care provider.  Use a cool mist humidifier to add humidity to the air in your home. This can make breathing easier.  Rest as needed.  Drink enough fluid to keep your urine clear or pale yellow.  Cover your mouth and nose when you cough or sneeze.  Wash your hands with soap  and water often, especially after you cough or sneeze. If soap and water are not available, use hand sanitizer.  Stay home from work or school as told by your health care provider. Unless you are visiting your health care provider, try to avoid leaving home until your fever has been gone for 24 hours without the use of medicine.  Keep all follow-up visits as told by your health care provider. This is  important. How is this prevented?  Getting an annual flu shot is the best way to avoid getting the flu. You may get the flu shot in late summer, fall, or winter. Ask your health care provider when you should get your flu shot.  Wash your hands often or use hand sanitizer often.  Avoid contact with people who are sick during cold and flu season.  Eat a healthy diet, drink plenty of fluids, get enough sleep, and exercise regularly. Contact a health care provider if:  You develop new symptoms.  You have:  Chest pain.  Diarrhea.  A fever.  Your cough gets worse.  You produce more mucus.  You feel nauseous or you vomit. Get help right away if:  You develop shortness of breath or difficulty breathing.  Your skin or nails turn a bluish color.  You have severe pain or stiffness in your neck.  You develop a sudden headache or sudden pain in your face or ear.  You cannot stop vomiting. This information is not intended to replace advice given to you by your health care provider. Make sure you discuss any questions you have with your health care provider. Document Released: 01/18/2000 Document Revised: 06/28/2015 Document Reviewed: 11/14/2014 Elsevier Interactive Patient Education  2017 [employer].

## 2016-03-07 NOTE — Progress Notes (Signed)
BP 140/70   Pulse 64   Temp 98.8 F (37.1 C) (Oral)   Resp 16   Ht 5\' 5"  (1.651 m)   Wt 159 lb (72.1 kg)   SpO2 98%   BMI 26.46 kg/m    CC: cough Subjective:    Patient ID: [name], female    DOB: 1931-01-21, 83 y.o.   MRN: [id number]  HPI: Darrica Skehan is a 83 y.o. female presenting on 03/07/2016 for Cough (congestion, fever, chills, 2 days ago)   2d h/o cough, congestion, muscle aches, ST, feverish yesterday. Cough mildly productive.   No dyspnea or wheezing, rhinorrhea, headaches, ear or tooth pain.   Non smoker No sick contacts Treating with mucinex DM and ibuprofen.  She did receive flu shot this year.  Relevant past medical, surgical, family and social history reviewed and updated as indicated. Interim medical history since our last visit reviewed. Allergies and medications reviewed and updated. Current Outpatient Prescriptions on File Prior to Visit  Medication Sig  . atenolol (TENORMIN) 25 MG tablet Take 1 tablet (25 mg total) by mouth 2 (two) times daily.  Marland Kitchen b complex vitamins tablet Take 1 tablet by mouth daily.    . cholecalciferol (VITAMIN D) 1000 UNITS tablet Take 1,000 Units by mouth daily.    . hydrochlorothiazide (HYDRODIURIL) 12.5 MG tablet Take 1 tablet (12.5 mg total) by mouth daily.  Marland Kitchen losartan (COZAAR) 50 MG tablet Take 1.5 tablets (75 mg total) by mouth at bedtime.  . Multiple Vitamin (MULTIVITAMIN) tablet Take 1 tablet by mouth daily.    Marland Kitchen omeprazole (PRILOSEC OTC) 20 MG tablet Take 20 mg by mouth as needed.     No current facility-administered medications on file prior to visit.     Review of Systems Per HPI unless specifically indicated in ROS section     Objective:    BP 140/70   Pulse 64   Temp 98.8 F (37.1 C) (Oral)   Resp 16   Ht 5\' 5"  (1.651 m)   Wt 159 lb (72.1 kg)   SpO2 98%   BMI 26.46 kg/m   Wt Readings from Last 3 Encounters:  03/07/16 159 lb (72.1 kg)  02/01/16 158 lb (71.7 kg)  01/23/16 158 lb 4  oz (71.8 kg)    Physical Exam  Constitutional: She appears well-developed and well-nourished. No distress.  HENT:  Head: Normocephalic and atraumatic.  Right Ear: Hearing, tympanic membrane, external ear and ear canal normal.  Left Ear: Hearing, tympanic membrane, external ear and ear canal normal.  Nose: No mucosal edema or rhinorrhea. Right sinus exhibits no maxillary sinus tenderness and no frontal sinus tenderness. Left sinus exhibits no maxillary sinus tenderness and no frontal sinus tenderness.  Mouth/Throat: Uvula is midline, oropharynx is clear and moist and mucous membranes are normal. No oropharyngeal exudate, posterior oropharyngeal edema, posterior oropharyngeal erythema or tonsillar abscesses.  Nasal mucosal inflammation  Eyes: Conjunctivae and EOM are normal. Pupils are equal, round, and reactive to light. No scleral icterus.  Neck: Normal range of motion. Neck supple.  Cardiovascular: Normal rate, regular rhythm, normal heart sounds and intact distal pulses.   No murmur heard. Pulmonary/Chest: Effort normal and breath sounds normal. No respiratory distress. She has no wheezes. She has no rales.  Lymphadenopathy:    She has no cervical adenopathy.  Skin: Skin is warm and dry. No rash noted.  Nursing note and vitals reviewed.     Assessment & Plan:   Problem List Items Addressed  This Visit    Influenza - Primary    Anticipate influenza given symptoms and peak of season. Given age treat aggressively with tamiflu.  Nontoxic, ok for outpatient management. Red flags to return discussed Reviewed tamiflu side effects      Relevant Medications   oseltamivir (TAMIFLU) 75 MG capsule       Follow up plan: Return if symptoms worsen or fail to improve.  Ria Bush, MD

## 2016-03-07 NOTE — Assessment & Plan Note (Signed)
Anticipate influenza given symptoms and peak of season. Given age treat aggressively with tamiflu.  Nontoxic, ok for outpatient management. Red flags to return discussed Reviewed tamiflu side effects

## 2016-04-08 DIAGNOSIS — H25812 Combined forms of age-related cataract, left eye: Secondary | ICD-10-CM | POA: Diagnosis not present

## 2016-04-08 DIAGNOSIS — H2512 Age-related nuclear cataract, left eye: Secondary | ICD-10-CM | POA: Diagnosis not present

## 2016-04-15 ENCOUNTER — Ambulatory Visit (INDEPENDENT_AMBULATORY_CARE_PROVIDER_SITE_OTHER): Payer: Medicare Other | Admitting: Family Medicine

## 2016-04-15 ENCOUNTER — Encounter (INDEPENDENT_AMBULATORY_CARE_PROVIDER_SITE_OTHER): Payer: Self-pay

## 2016-04-15 ENCOUNTER — Encounter: Payer: Self-pay | Admitting: Family Medicine

## 2016-04-15 VITALS — BP 148/70 | HR 76 | Temp 98.6°F | Ht 65.0 in | Wt 160.5 lb

## 2016-04-15 DIAGNOSIS — F430 Acute stress reaction: Secondary | ICD-10-CM | POA: Insufficient documentation

## 2016-04-15 DIAGNOSIS — I10 Essential (primary) hypertension: Secondary | ICD-10-CM | POA: Diagnosis not present

## 2016-04-15 MED ORDER — LOSARTAN POTASSIUM 50 MG PO TABS: 100.0000 mg | ORAL_TABLET | Freq: Every day | ORAL | 0 refills | Status: DC

## 2016-04-15 NOTE — Progress Notes (Signed)
Subjective:    Patient ID: Kristine [place], female    DOB: 06/04/1930, 83 y.o.   MRN: [id number]  HPI 83 yo pt of Dr Darnell Level here for HTN / BP check   Wt Readings from Last 3 Encounters:  04/15/16 160 lb 8 oz (72.8 kg)  03/07/16 159 lb (72.1 kg)  02/01/16 158 lb (71.7 kg)   bmi is 26.7  bp has been spiking more than usual Of note she has more stress  Had cataract surgery and it was high (over 989 systolic) This am her systolic was 211/94 (before am med)  Makes her feel a little strange in the head but no headache or cp or rapid HR  No ankle swelling   Not abused or in any danger Going through grief -lost her husband in [place]  A lot more responsibility - has to sell her house / do the taxes etc     She has a family hx of HTN - both parents    BP Readings from Last 3 Encounters:  04/15/16 (!) 152/70  03/07/16 140/70  02/01/16 128/74    On 2nd check  BP: (!) 148/70    She takes atenolol 25 mg bid hctz 12.5 mg daily  Cozaar 75 mg at bedtime   Less careful about her diet / forgets to eat/ more salt than she should be getting   Pulse Readings from Last 3 Encounters:  04/15/16 76  03/07/16 64  02/01/16 64   Her bp cuff is not "state of the art"  When last check it was working ok     Chemistry      Component Value Date/Time   NA 137 01/23/2016 1026   K 4.1 01/23/2016 1026   CL 100 01/23/2016 1026   CO2 30 01/23/2016 1026   BUN 16 01/23/2016 1026   CREATININE 0.76 01/23/2016 1026      Component Value Date/Time   CALCIUM 9.5 01/23/2016 1026   ALKPHOS 63 01/23/2016 1026   AST 18 01/23/2016 1026   ALT 18 01/23/2016 1026   BILITOT 0.5 01/23/2016 1026       Patient Active Problem List   Diagnosis Date Noted  . Influenza 03/07/2016  . Stress due to illness of family member 02/01/2016  . Insomnia 10/20/2014  . Advanced care planning/counseling discussion 12/23/2013  . Medicare annual wellness visit, subsequent 12/23/2011  . Prediabetes 12/03/2006  .  HLD (hyperlipidemia) 09/02/2006  . Essential hypertension 09/02/2006  . GERD 09/02/2006  . POSTMENOPAUSAL STATUS 09/02/2006  . ALLERGY 09/02/2006   Past Medical History:  Diagnosis Date  . ERM OD (epiretinal membrane, right eye) 2015   also with R choroidal nevus; referred to Dr. Zigmund Daniel  . GERD (gastroesophageal reflux disease) 02/2000  . Hyperlipemia 07/2000  . Hypertension 01/2001   Past Surgical History:  Procedure Laterality Date  . TONSILLECTOMY    . TOTAL ABDOMINAL HYSTERECTOMY  1981   w/BSO for fibroids (Dr. Jerene Pitch)   Social History  Substance Use Topics  . Smoking status: Never Smoker  . Smokeless tobacco: Never Used  . Alcohol use 0.5 oz/week    1 drink(s) per week     Comment: red wine several times a week   Family History  Problem Relation Age of Onset  . Hypertension Mother   . Heart disease Father     MI, CVD  . Kidney disease Father     dialysis  . Hypertension Father   . Cancer Brother  lung (smoker)  . Aneurysm Brother     of aorta X 2  . Cancer Maternal Aunt     3 aunts (breast, pancreatic)   Allergies  Allergen Reactions  . Codeine Sulfate     REACTION: NAUSEA  . Trazodone And Nefazodone Other (See Comments)    Bad dreams   Current Outpatient Prescriptions on File Prior to Visit  Medication Sig Dispense Refill  . atenolol (TENORMIN) 25 MG tablet Take 1 tablet (25 mg total) by mouth 2 (two) times daily. 180 tablet 3  . b complex vitamins tablet Take 1 tablet by mouth daily.      . cholecalciferol (VITAMIN D) 1000 UNITS tablet Take 1,000 Units by mouth daily.      Marland Kitchen Dextromethorphan-Guaifenesin (MUCINEX DM) 30-600 MG TB12 Take 1 tablet by mouth as needed.    . hydrochlorothiazide (HYDRODIURIL) 12.5 MG tablet Take 1 tablet (12.5 mg total) by mouth daily. 90 tablet 3  . ibuprofen (ADVIL,MOTRIN) 200 MG tablet Take 200 mg by mouth every 6 (six) hours as needed.    . Multiple Vitamin (MULTIVITAMIN) tablet Take 1 tablet by mouth daily.      Marland Kitchen  omeprazole (PRILOSEC OTC) 20 MG tablet Take 20 mg by mouth as needed.       No current facility-administered medications on file prior to visit.     Review of Systems Review of Systems  Constitutional: Negative for fever, appetite change, and unexpected weight change. pos for some fatigue with stressors  Eyes: Negative for pain and visual disturbance.  Respiratory: Negative for cough and shortness of breath.   Cardiovascular: Negative for cp or palpitations    Gastrointestinal: Negative for nausea, diarrhea and constipation.  Genitourinary: Negative for urgency and frequency.  Skin: Negative for pallor or rash   Neurological: Negative for weakness, light-headedness, numbness and headaches.  Hematological: Negative for adenopathy. Does not bruise/bleed easily.  Psychiatric/Behavioral: Negative for dysphoric mood. The patient is not nervous/anxious.  pos for stressors with overwhelmed feeling at times        Objective:   Physical Exam  Constitutional: She appears well-developed and well-nourished. No distress.  Well appearing elderly female   HENT:  Head: Normocephalic and atraumatic.  Mouth/Throat: Oropharynx is clear and moist.  Eyes: Conjunctivae and EOM are normal. Pupils are equal, round, and reactive to light.  Neck: Normal range of motion. Neck supple. No JVD present. Carotid bruit is not present. No thyromegaly present.  Cardiovascular: Normal rate, regular rhythm, normal heart sounds and intact distal pulses.  Exam reveals no gallop.   Some varicosities   Pulmonary/Chest: Effort normal and breath sounds normal. No respiratory distress. She has no wheezes. She has no rales.  No crackles  Abdominal: Soft. Bowel sounds are normal. She exhibits no distension, no abdominal bruit and no mass. There is no tenderness.  Musculoskeletal: She exhibits no edema or tenderness.  Lymphadenopathy:    She has no cervical adenopathy.  Neurological: She is alert. She has normal reflexes.    Skin: Skin is warm and dry. No rash noted.  Psychiatric: She has a normal mood and affect.  Candidly discusses stressors but not seemingly anx or depressed           Assessment & Plan:   Problem List Items Addressed This Visit      Cardiovascular and Mediastinum   Essential hypertension - Primary    bp is not optimal/ but better than home readings  BP Readings from Last 1 Encounters:  04/15/16 Marland Kitchen)  148/70    Disc lifstyle change with low sodium diet and exercise  Stress could play a role in high am bp (as well as grief) Labs reviewed from last draw Will inc her losartan to 100 mg each evening  Reviewed proper way to check bp  Only check when relaxed F/u with PCP in 2-3 wk or earlier if needed       Relevant Medications   losartan (COZAAR) 50 MG tablet     Other   Stress reaction    Feeling overwhelmed with much to do after death of her husband (as well as grief)  Reviewed stressors/ coping techniques/symptoms/ support sources/ tx options and side effects in detail today  Overall handling it well but not a lot of time for self care  Enc to get help at home when she can (she is) Counseling is an option as well No doubt this aff her bp as well

## 2016-04-15 NOTE — Patient Instructions (Signed)
Always take BP when relaxed (at least 1 hour after your hctz in the am) with both feet on the floor and arm at heart level  Increase your losartan 100 mg each evening (that is two of the 50 mg tablets)  Stay active  Drink lots of water Watch diet for sodium   Follow up with Dr Darnell Level in 2-3 weeks  Alert [place] if any problems

## 2016-04-15 NOTE — Progress Notes (Signed)
Pre visit review using our clinic review tool, if applicable. No additional management support is needed unless otherwise documented below in the visit note. 

## 2016-04-15 NOTE — Assessment & Plan Note (Signed)
Feeling overwhelmed with much to do after death of her husband (as well as grief)  Reviewed stressors/ coping techniques/symptoms/ support sources/ tx options and side effects in detail today  Overall handling it well but not a lot of time for self care  Enc to get help at home when she can (she is) Counseling is an option as well No doubt this aff her bp as well

## 2016-04-15 NOTE — Assessment & Plan Note (Signed)
bp is not optimal/ but better than home readings  BP Readings from Last 1 Encounters:  04/15/16 (!) 148/70    Disc lifstyle change with low sodium diet and exercise  Stress could play a role in high am bp (as well as grief) Labs reviewed from last draw Will inc her losartan to 100 mg each evening  Reviewed proper way to check bp  Only check when relaxed F/u with PCP in 2-3 wk or earlier if needed

## 2016-04-29 ENCOUNTER — Encounter (INDEPENDENT_AMBULATORY_CARE_PROVIDER_SITE_OTHER): Payer: Self-pay

## 2016-04-29 ENCOUNTER — Encounter: Payer: Self-pay | Admitting: Family Medicine

## 2016-04-29 ENCOUNTER — Ambulatory Visit (INDEPENDENT_AMBULATORY_CARE_PROVIDER_SITE_OTHER): Payer: Medicare Other | Admitting: Family Medicine

## 2016-04-29 VITALS — BP 148/82 | HR 64 | Temp 98.0°F | Wt 161.8 lb

## 2016-04-29 DIAGNOSIS — Z634 Disappearance and death of family member: Secondary | ICD-10-CM

## 2016-04-29 DIAGNOSIS — F430 Acute stress reaction: Secondary | ICD-10-CM | POA: Diagnosis not present

## 2016-04-29 DIAGNOSIS — F4329 Adjustment disorder with other symptoms: Secondary | ICD-10-CM

## 2016-04-29 DIAGNOSIS — I10 Essential (primary) hypertension: Secondary | ICD-10-CM | POA: Diagnosis not present

## 2016-04-29 DIAGNOSIS — Z6379 Other stressful life events affecting family and household: Secondary | ICD-10-CM | POA: Diagnosis not present

## 2016-04-29 DIAGNOSIS — F4321 Adjustment disorder with depressed mood: Secondary | ICD-10-CM

## 2016-04-29 MED ORDER — SERTRALINE HCL 25 MG PO TABS: 25.0000 mg | ORAL_TABLET | Freq: Every day | ORAL | 6 refills | Status: DC

## 2016-04-29 MED ORDER — HYDROCHLOROTHIAZIDE 25 MG PO TABS: 25.0000 mg | ORAL_TABLET | Freq: Every day | ORAL | 3 refills | Status: DC

## 2016-04-29 NOTE — Progress Notes (Signed)
Pre visit review using our clinic review tool, if applicable. No additional management support is needed unless otherwise documented below in the visit note. 

## 2016-04-29 NOTE — Patient Instructions (Signed)
Let's start sertraline 25mg  daily - mood medicine that can help. It will take 3-4 weeks to take full effect. If feeling bad on it, stop and let me know. We will refer you to counselor - see marion on your way out. We will also increase hydrochlorothiazide to 25mg  daily - double up until you run out. Continue watching blood pressures at home, let us know if any low blood pressure symptoms of dizziness, lightheadedness, or passing out.  Hang in there! Try to get back to gym.   Complicated Grieving Grief is a normal response to the death of someone close to you. Feelings of fear, anger, and guilt can affect almost everyone who loses a loved one. It is also common to have symptoms of depression while you are grieving. These include problems with sleep, loss of appetite, and lack of energy. They may last for weeks or months after a loss. Complicated grief is different from normal grief or depression. Normal grieving involves sadness and feelings of loss, but these feelings are not constant. Complicated grief is a constant and severe type of grief. It interferes with your ability to function normally. It may last for several months to a year or longer. Complicated grief may require treatment from a mental health care provider. What are the causes? It is not known why some people continue to struggle with grief and others do not. You may be at higher risk for complicated grief if:  The death of your loved one was sudden or unexpected.  The death of your loved one was due to a violent event.  Your loved one committed suicide.  Your loved one was a child or a young person.  You were very close to or dependent on the loved one.  You have a history of depression. What are the signs or symptoms? Signs and symptoms of complicated grief may include:  Feeling disbelief or numbness.  Being unable to enjoy good memories of your loved one.  Needing to avoid anything that reminds you of your loved  one.  Being unable to stop thinking about the death.  Feeling intense anger or guilt.  Feeling alone and hopeless.  Feeling that your life is meaningless and empty.  Losing the desire to live. How is this diagnosed? Your health care provider may diagnose complicated grief if:  You have constant symptoms of grief for 6-12 months or longer.  Your symptoms are interfering with your ability to live your life. Your health care provider may want you to see a mental health care provider. Many symptoms of depression are similar to the symptoms of complicated grief. It is important to be evaluated for complicated grief along with other mental health conditions. How is this treated? Talk therapy with a mental health provider is the most common treatment for complicated grief. During therapy, you will learn healthy ways to cope with the loss of your loved one. In some cases, your mental health care provider may also recommend antidepressant medicines. Follow these instructions at home:  Take care of yourself.  Eat regular meals and maintain a healthy diet. Eat plenty of fruits, vegetables, and whole grains.  Try to get some exercise each day.  Keep regular hours for sleep. Try to get at least 8 hours of sleep each night.  Do not use drugs or alcohol to ease your symptoms.  Take medicines only as directed by your health care provider.  Spend time with friends and loved ones.  Consider joining a grief (  bereavement) support group to help you deal with your loss.  Keep all follow-up visits as directed by your health care provider. This is important. Contact a health care provider if:  Your symptoms keep you from functioning normally.  Your symptoms do not get better with treatment. Get help right away if:  You have serious thoughts of hurting yourself or someone else.  You have suicidal feelings. This information is not intended to replace advice given to you by your health care  provider. Make sure you discuss any questions you have with your health care provider. Document Released: 01/20/2005 Document Revised: 06/28/2015 Document Reviewed: 06/30/2013 Elsevier Interactive Patient Education  2017 [employer].

## 2016-04-29 NOTE — Progress Notes (Signed)
BP (!) 148/82   Pulse 64   Temp 98 F (36.7 C) (Oral)   Wt 161 lb 12 oz (73.4 kg)   BMI 26.92 kg/m    CC: f/u visit Subjective:    Patient ID: Kristine Owens, female    DOB: October 31, 1930, 83 y.o.   MRN: [id number]  HPI: Kristine Owens is a 83 y.o. female presenting on 04/29/2016 for Follow-up   Saw Dr Glori Bickers 2 wks ago with elevated blood pressure readings in office to 150/70s, worse at home (up to 355 systolics!). Lost husband 01/2016 (Creutzfeldt-jakob prior disease). Compliant with current antihypertensive regimen of atenolol 25mg  bid, hctz 12.5mg  daily, cozaar 75mg  at bedtime. Dr Glori Bickers increased losartan to 100mg  daily. Here for f/u.   She continues checking bp at home - up to 974 systolic.  Significant increase in stress. Supportive family (local daughter).  Endorses BP cuff well calibrated (checked recently with our office cuff).  No SI/HI.  She does enjoy gym as way of relieving stress - however hasn't been out to gym recently.   Relevant past medical, surgical, family and social history reviewed and updated as indicated. Interim medical history since our last visit reviewed. Allergies and medications reviewed and updated. Outpatient Medications Prior to Visit  Medication Sig Dispense Refill  . atenolol (TENORMIN) 25 MG tablet Take 1 tablet (25 mg total) by mouth 2 (two) times daily. 180 tablet 3  . b complex vitamins tablet Take 1 tablet by mouth daily.      . cholecalciferol (VITAMIN D) 1000 UNITS tablet Take 1,000 Units by mouth daily.      Marland Kitchen Dextromethorphan-Guaifenesin (MUCINEX DM) 30-600 MG TB12 Take 1 tablet by mouth as needed.    Marland Kitchen ibuprofen (ADVIL,MOTRIN) 200 MG tablet Take 200 mg by mouth every 6 (six) hours as needed.    Marland Kitchen losartan (COZAAR) 50 MG tablet Take 2 tablets (100 mg total) by mouth at bedtime. 30 tablet 0  . Multiple Vitamin (MULTIVITAMIN) tablet Take 1 tablet by mouth daily.      Marland Kitchen omeprazole (PRILOSEC OTC) 20 MG tablet Take 20 mg by mouth as  needed.      . hydrochlorothiazide (HYDRODIURIL) 12.5 MG tablet Take 1 tablet (12.5 mg total) by mouth daily. 90 tablet 3   No facility-administered medications prior to visit.      Per HPI unless specifically indicated in ROS section below Review of Systems     Objective:    BP (!) 148/82   Pulse 64   Temp 98 F (36.7 C) (Oral)   Wt 161 lb 12 oz (73.4 kg)   BMI 26.92 kg/m   Wt Readings from Last 3 Encounters:  04/29/16 161 lb 12 oz (73.4 kg)  04/15/16 160 lb 8 oz (72.8 kg)  03/07/16 159 lb (72.1 kg)    Physical Exam  Constitutional: She appears well-developed and well-nourished. No distress.  HENT:  Mouth/Throat: Oropharynx is clear and moist. No oropharyngeal exudate.  Eyes: Conjunctivae and EOM are normal. Pupils are equal, round, and reactive to light. No scleral icterus.  Cardiovascular: Normal rate, regular rhythm, normal heart sounds and intact distal pulses.   No murmur heard. Pulmonary/Chest: Effort normal and breath sounds normal. No respiratory distress. She has no wheezes. She has no rales.  Musculoskeletal: Normal range of motion. She exhibits no edema.  Skin: Skin is warm and dry. No rash noted.  Psychiatric: Her speech is normal and behavior is normal. Thought content normal. She exhibits a depressed mood.  Nursing note and vitals reviewed.  Results for orders placed or performed in visit on 01/31/16  HM MAMMOGRAPHY  Result Value Ref Range   HM Mammogram 0-4 Bi-Rad 0-4 Bi-Rad, Self Reported Normal      Assessment & Plan:  Over 25 minutes were spent face-to-face with the patient during this encounter and >50% of that time was spent on counseling and coordination of care  Problem List Items Addressed This Visit    Essential hypertension    bp remains elevated - will continue higher losartan 100mg , continue atenolol 25mg  daily, and will increase hctz to 25mg  daily. Pt agrees with plan. Reassess at f/u appt next month.       Relevant Medications    hydrochlorothiazide (HYDRODIURIL) 25 MG tablet   Stress due to illness of family member   Stress reaction - Primary    Persistent complicated grief with overwhelming feelings with all she has to do at home along with feelings of isolation. Interested in pharmacotherapy for assistance as well as counseling.  Support provided. Will start zoloft 25mg  daily and refer for counseling.  RTC 1 mo f/u visit.  Pt denies SI/HI.  Discussed importance of self care and working on healthy stress relieving strategies.       Relevant Orders   Ambulatory referral to Psychology    Other Visit Diagnoses    Complicated grieving       Relevant Orders   Ambulatory referral to Psychology       Follow up plan: Return in about 4 weeks (around 05/27/2016) for follow up visit.  Ria Bush, MD

## 2016-04-30 NOTE — Assessment & Plan Note (Signed)
Persistent complicated grief with overwhelming feelings with all she has to do at home along with feelings of isolation. Interested in pharmacotherapy for assistance as well as counseling.  Support provided. Will start zoloft 25mg  daily and refer for counseling.  RTC 1 mo f/u visit.  Pt denies SI/HI.  Discussed importance of self care and working on healthy stress relieving strategies.

## 2016-04-30 NOTE — Assessment & Plan Note (Signed)
bp remains elevated - will continue higher losartan 100mg , continue atenolol 25mg  daily, and will increase hctz to 25mg  daily. Pt agrees with plan. Reassess at f/u appt next month.

## 2016-05-07 DIAGNOSIS — H3554 Dystrophies primarily involving the retinal pigment epithelium: Secondary | ICD-10-CM | POA: Diagnosis not present

## 2016-05-07 DIAGNOSIS — H2511 Age-related nuclear cataract, right eye: Secondary | ICD-10-CM | POA: Diagnosis not present

## 2016-05-07 DIAGNOSIS — H25011 Cortical age-related cataract, right eye: Secondary | ICD-10-CM | POA: Diagnosis not present

## 2016-05-27 ENCOUNTER — Encounter: Payer: Self-pay | Admitting: Family Medicine

## 2016-05-27 ENCOUNTER — Ambulatory Visit (INDEPENDENT_AMBULATORY_CARE_PROVIDER_SITE_OTHER): Payer: Medicare Other | Admitting: Family Medicine

## 2016-05-27 ENCOUNTER — Encounter: Payer: Self-pay | Admitting: *Deleted

## 2016-05-27 VITALS — BP 130/80 | HR 60 | Temp 97.7°F | Wt 162.2 lb

## 2016-05-27 DIAGNOSIS — F430 Acute stress reaction: Secondary | ICD-10-CM | POA: Diagnosis not present

## 2016-05-27 DIAGNOSIS — I10 Essential (primary) hypertension: Secondary | ICD-10-CM | POA: Diagnosis not present

## 2016-05-27 DIAGNOSIS — G4700 Insomnia, unspecified: Secondary | ICD-10-CM

## 2016-05-27 LAB — BASIC METABOLIC PANEL
BUN: 18 mg/dL (ref 6–23)
CALCIUM: 9.3 mg/dL (ref 8.4–10.5)
CHLORIDE: 100 meq/L (ref 96–112)
CO2: 29 meq/L (ref 19–32)
CREATININE: 0.86 mg/dL (ref 0.40–1.20)
GFR: 66.91 mL/min (ref >60.00)
GLUCOSE: 74 mg/dL (ref 70–99)
Potassium: 3.7 mEq/L (ref 3.5–5.1)
Sodium: 136 mEq/L (ref 135–145)

## 2016-05-27 LAB — MICROALBUMIN / CREATININE URINE RATIO
CREATININE, U: 64.7 mg/dL
Microalb Creat Ratio: 1.1 mg/g (ref 0.0–30.0)
Microalb, Ur: <0.7 mg/dL (ref 0.0–1.9)

## 2016-05-27 MED ORDER — LOSARTAN POTASSIUM 100 MG PO TABS
100.0000 mg | ORAL_TABLET | Freq: Every day | ORAL | 1 refills | Status: DC
Start: 2016-05-27 — End: 2017-06-18

## 2016-05-27 MED ORDER — LOSARTAN POTASSIUM 100 MG PO TABS: 100.0000 mg | ORAL_TABLET | Freq: Every day | ORAL | 1 refills | Status: DC

## 2016-05-27 NOTE — Patient Instructions (Addendum)
Ok to stop sertraline, ok to take benadryl (unisom) or melatonin for sleep aid.  Continue current blood pressure medicines.  Labs today If doing well, return in 8 months for medicare wellness and follow up visit with me

## 2016-05-27 NOTE — Assessment & Plan Note (Signed)
Discussed benadryl and melatonin use.

## 2016-05-27 NOTE — Progress Notes (Signed)
BP 130/80   Pulse 60   Temp 97.7 F (36.5 C) (Oral)   Wt 162 lb 4 oz (73.6 kg)   BMI 27.00 kg/m    CC: 73mo f/u visit Subjective:    Patient ID: Kristine Owens, female    DOB: 1930-12-09, 83 y.o.   MRN: [id number]  HPI: Kristine Owens is a 83 y.o. female presenting on 05/27/2016 for Follow-up   See prior note for details. Last visit we started sertraline 25mg  daily for stress reaction after husband's death (CJD) contributing to elevated blood pressures. She hasn't been taking at night. I also referred her to counseling - appt scheduled for 06/2016. She may cancel appointment.  Last visit we also continued losartan, atenolol, and increased hctz to 25mg  daily for blood pressures.   Denies HA, vision changes, CP/tightness, SOB, leg swelling.   Planned R cataract surgery mid May.  Relevant past medical, surgical, family and social history reviewed and updated as indicated. Interim medical history since our last visit reviewed. Allergies and medications reviewed and updated. Outpatient Medications Prior to Visit  Medication Sig Dispense Refill  . atenolol (TENORMIN) 25 MG tablet Take 1 tablet (25 mg total) by mouth 2 (two) times daily. 180 tablet 3  . b complex vitamins tablet Take 1 tablet by mouth daily.      . cholecalciferol (VITAMIN D) 1000 UNITS tablet Take 1,000 Units by mouth daily.      Marland Kitchen Dextromethorphan-Guaifenesin (MUCINEX DM) 30-600 MG TB12 Take 1 tablet by mouth as needed.    . hydrochlorothiazide (HYDRODIURIL) 25 MG tablet Take 1 tablet (25 mg total) by mouth daily. 90 tablet 3  . ibuprofen (ADVIL,MOTRIN) 200 MG tablet Take 200 mg by mouth every 6 (six) hours as needed.    . Multiple Vitamin (MULTIVITAMIN) tablet Take 1 tablet by mouth daily.      Marland Kitchen omeprazole (PRILOSEC OTC) 20 MG tablet Take 20 mg by mouth as needed.      Marland Kitchen losartan (COZAAR) 50 MG tablet Take 2 tablets (100 mg total) by mouth at bedtime. 30 tablet 0  . sertraline (ZOLOFT) 25 MG tablet Take  1 tablet (25 mg total) by mouth daily. 30 tablet 6   No facility-administered medications prior to visit.      Per HPI unless specifically indicated in ROS section below Review of Systems     Objective:    BP 130/80   Pulse 60   Temp 97.7 F (36.5 C) (Oral)   Wt 162 lb 4 oz (73.6 kg)   BMI 27.00 kg/m   Wt Readings from Last 3 Encounters:  05/27/16 162 lb 4 oz (73.6 kg)  04/29/16 161 lb 12 oz (73.4 kg)  04/15/16 160 lb 8 oz (72.8 kg)    Physical Exam  Constitutional: She appears well-developed and well-nourished. No distress.  Cardiovascular: Normal rate, regular rhythm, normal heart sounds and intact distal pulses.   No murmur heard. Pulmonary/Chest: Effort normal and breath sounds normal. No respiratory distress. She has no wheezes. She has no rales.  Musculoskeletal: She exhibits no edema.  Skin: Skin is warm and dry. No rash noted.  Psychiatric: She has a normal mood and affect.  Nursing note and vitals reviewed.  Results for orders placed or performed in visit on 01/31/16  HM MAMMOGRAPHY  Result Value Ref Range   HM Mammogram 0-4 Bi-Rad 0-4 Bi-Rad, Self Reported Normal      Assessment & Plan:   Problem List Items Addressed This Visit  Essential hypertension - Primary    Chronic, improved. Continue current regimen. Update Cr and microalbumin.      Relevant Medications   losartan (COZAAR) 100 MG tablet   Other Relevant Orders   Microalbumin / creatinine urine ratio   Basic metabolic panel   Insomnia    Discussed benadryl and melatonin use.      Stress reaction    Overall doing better. Did not end up taking sertraline. Feels she may cancel counselor appointment as she's doing better overall.          Follow up plan: Return in about 8 months (around 01/26/2017), or if symptoms worsen or fail to improve, for medicare wellness visit, follow up visit.  Ria Bush, MD

## 2016-05-27 NOTE — Assessment & Plan Note (Signed)
Overall doing better. Did not end up taking sertraline. Feels she may cancel counselor appointment as she's doing better overall.

## 2016-05-27 NOTE — Assessment & Plan Note (Signed)
Chronic, improved. Continue current regimen. Update Cr and microalbumin.

## 2016-05-27 NOTE — Progress Notes (Signed)
Pre visit review using our clinic review tool, if applicable. No additional management support is needed unless otherwise documented below in the visit note. 

## 2016-06-03 ENCOUNTER — Encounter (INDEPENDENT_AMBULATORY_CARE_PROVIDER_SITE_OTHER): Payer: Medicare Other | Admitting: Ophthalmology

## 2016-06-03 DIAGNOSIS — H43813 Vitreous degeneration, bilateral: Secondary | ICD-10-CM | POA: Diagnosis not present

## 2016-06-03 DIAGNOSIS — D3132 Benign neoplasm of left choroid: Secondary | ICD-10-CM | POA: Diagnosis not present

## 2016-06-03 DIAGNOSIS — H33301 Unspecified retinal break, right eye: Secondary | ICD-10-CM | POA: Diagnosis not present

## 2016-06-03 DIAGNOSIS — H35033 Hypertensive retinopathy, bilateral: Secondary | ICD-10-CM

## 2016-06-03 DIAGNOSIS — H35373 Puckering of macula, bilateral: Secondary | ICD-10-CM | POA: Diagnosis not present

## 2016-06-03 DIAGNOSIS — I10 Essential (primary) hypertension: Secondary | ICD-10-CM

## 2016-06-03 HISTORY — PX: CATARACT EXTRACTION: SUR2

## 2016-06-10 ENCOUNTER — Ambulatory Visit: Payer: Medicare Other | Admitting: Psychology

## 2016-06-17 DIAGNOSIS — H25811 Combined forms of age-related cataract, right eye: Secondary | ICD-10-CM | POA: Diagnosis not present

## 2016-06-17 DIAGNOSIS — H2511 Age-related nuclear cataract, right eye: Secondary | ICD-10-CM | POA: Diagnosis not present

## 2016-07-10 ENCOUNTER — Encounter: Payer: Self-pay | Admitting: Family Medicine

## 2016-07-12 ENCOUNTER — Encounter: Payer: Self-pay | Admitting: Family Medicine

## 2016-09-29 ENCOUNTER — Telehealth: Payer: Self-pay | Admitting: Family Medicine

## 2016-09-29 NOTE — Telephone Encounter (Signed)
Left pt message asking to call Ebony Hail back directly at [phone] to schedule AWV + labs with Katha Cabal and CPE with PCP.  *NOTE* Last AWV 01/23/16--Please schedule after 01/23/17

## 2016-09-30 NOTE — Telephone Encounter (Signed)
Scheduled 01/23/17. Pt aware

## 2016-10-02 DIAGNOSIS — D1801 Hemangioma of skin and subcutaneous tissue: Secondary | ICD-10-CM | POA: Diagnosis not present

## 2016-10-02 DIAGNOSIS — D225 Melanocytic nevi of trunk: Secondary | ICD-10-CM | POA: Diagnosis not present

## 2016-10-02 DIAGNOSIS — L821 Other seborrheic keratosis: Secondary | ICD-10-CM | POA: Diagnosis not present

## 2016-10-02 DIAGNOSIS — L853 Xerosis cutis: Secondary | ICD-10-CM | POA: Diagnosis not present

## 2016-10-02 DIAGNOSIS — L814 Other melanin hyperpigmentation: Secondary | ICD-10-CM | POA: Diagnosis not present

## 2016-11-25 ENCOUNTER — Other Ambulatory Visit: Payer: Self-pay | Admitting: Family Medicine

## 2016-12-04 ENCOUNTER — Encounter (INDEPENDENT_AMBULATORY_CARE_PROVIDER_SITE_OTHER): Payer: Medicare Other | Admitting: Ophthalmology

## 2016-12-11 ENCOUNTER — Ambulatory Visit (INDEPENDENT_AMBULATORY_CARE_PROVIDER_SITE_OTHER): Payer: Medicare Other | Admitting: Ophthalmology

## 2016-12-24 ENCOUNTER — Encounter (INDEPENDENT_AMBULATORY_CARE_PROVIDER_SITE_OTHER): Payer: Medicare Other | Admitting: Ophthalmology

## 2016-12-24 DIAGNOSIS — I10 Essential (primary) hypertension: Secondary | ICD-10-CM | POA: Diagnosis not present

## 2016-12-24 DIAGNOSIS — H35341 Macular cyst, hole, or pseudohole, right eye: Secondary | ICD-10-CM

## 2016-12-24 DIAGNOSIS — H35371 Puckering of macula, right eye: Secondary | ICD-10-CM | POA: Diagnosis not present

## 2016-12-24 DIAGNOSIS — D3132 Benign neoplasm of left choroid: Secondary | ICD-10-CM | POA: Diagnosis not present

## 2016-12-24 DIAGNOSIS — H43813 Vitreous degeneration, bilateral: Secondary | ICD-10-CM | POA: Diagnosis not present

## 2016-12-24 DIAGNOSIS — H35033 Hypertensive retinopathy, bilateral: Secondary | ICD-10-CM | POA: Diagnosis not present

## 2016-12-30 ENCOUNTER — Other Ambulatory Visit: Payer: Self-pay | Admitting: Family Medicine

## 2016-12-30 DIAGNOSIS — Z1231 Encounter for screening mammogram for malignant neoplasm of breast: Secondary | ICD-10-CM

## 2017-01-22 ENCOUNTER — Other Ambulatory Visit (INDEPENDENT_AMBULATORY_CARE_PROVIDER_SITE_OTHER): Payer: Medicare Other

## 2017-01-22 ENCOUNTER — Telehealth: Payer: Self-pay

## 2017-01-22 DIAGNOSIS — I10 Essential (primary) hypertension: Secondary | ICD-10-CM | POA: Diagnosis not present

## 2017-01-22 DIAGNOSIS — R7303 Prediabetes: Secondary | ICD-10-CM

## 2017-01-22 DIAGNOSIS — E785 Hyperlipidemia, unspecified: Secondary | ICD-10-CM

## 2017-01-22 LAB — HEPATIC FUNCTION PANEL
ALK PHOS: 71 U/L (ref 39–117)
ALT: 13 U/L (ref 0–35)
AST: 16 U/L (ref 0–37)
Albumin: 4.2 g/dL (ref 3.5–5.2)
BILIRUBIN DIRECT: 0.1 mg/dL (ref 0.0–0.3)
TOTAL PROTEIN: 6.8 g/dL (ref 6.0–8.3)
Total Bilirubin: 0.6 mg/dL (ref 0.2–1.2)

## 2017-01-22 LAB — LIPID PANEL
Cholesterol: 179 mg/dL (ref 0–200)
HDL: 63.9 mg/dL (ref >39.00)
LDL CALC: 94 mg/dL (ref 0–99)
NONHDL: 114.88
Total CHOL/HDL Ratio: 3
Triglycerides: 103 mg/dL (ref 0.0–149.0)
VLDL: 20.6 mg/dL (ref 0.0–40.0)

## 2017-01-22 LAB — TSH: TSH: 2.8 u[IU]/mL (ref 0.35–4.50)

## 2017-01-22 LAB — HEMOGLOBIN A1C: Hgb A1c MFr Bld: 5.5 % (ref 4.6–6.5)

## 2017-01-22 NOTE — Telephone Encounter (Signed)
Can we add BMP?  Thanks

## 2017-01-22 NOTE — Telephone Encounter (Signed)
CPE labs ordered. A1C, HFP, TSH, FLP  BMP 4/24 Microalbumin 4/24  Dr. Darnell Level, please review lab orders, make modifications as necessary, and sign note. Thank you.

## 2017-01-23 ENCOUNTER — Ambulatory Visit (INDEPENDENT_AMBULATORY_CARE_PROVIDER_SITE_OTHER): Payer: Medicare Other | Admitting: Family Medicine

## 2017-01-23 ENCOUNTER — Ambulatory Visit: Payer: Self-pay

## 2017-01-23 ENCOUNTER — Encounter: Payer: Self-pay | Admitting: Family Medicine

## 2017-01-23 VITALS — BP 126/74 | HR 61 | Temp 98.4°F | Wt 163.0 lb

## 2017-01-23 DIAGNOSIS — B9789 Other viral agents as the cause of diseases classified elsewhere: Secondary | ICD-10-CM | POA: Diagnosis not present

## 2017-01-23 DIAGNOSIS — J069 Acute upper respiratory infection, unspecified: Secondary | ICD-10-CM | POA: Diagnosis not present

## 2017-01-23 LAB — BASIC METABOLIC PANEL
BUN: 12 mg/dL (ref 6–23)
CALCIUM: 9 mg/dL (ref 8.4–10.5)
CO2: 27 meq/L (ref 19–32)
CREATININE: 0.85 mg/dL (ref 0.40–1.20)
Chloride: 93 mEq/L — ABNORMAL LOW (ref 96–112)
GFR: 67.71 mL/min (ref >60.00)
Glucose, Bld: 100 mg/dL — ABNORMAL HIGH (ref 70–99)
Potassium: 3.6 mEq/L (ref 3.5–5.1)
Sodium: 130 mEq/L — ABNORMAL LOW (ref 135–145)

## 2017-01-23 NOTE — Telephone Encounter (Signed)
I have added the lab.

## 2017-01-23 NOTE — Assessment & Plan Note (Signed)
Anticipate viral given short duration  Supportive care reviewed with patient.  Update if not improving with treatment.

## 2017-01-23 NOTE — Telephone Encounter (Signed)
plz add BMP to blood in lab. Thanks.

## 2017-01-23 NOTE — Addendum Note (Signed)
Addended by: Pilar Grammes on: 01/23/2017 08:40 AM   Modules accepted: Orders

## 2017-01-23 NOTE — Progress Notes (Signed)
BP 126/74 (BP Location: Left Arm, Patient Position: Sitting, Cuff Size: Normal)   Pulse 61   Temp 98.4 F (36.9 C) (Oral)   Wt 163 lb (73.9 kg)   SpO2 97%   BMI 27.12 kg/m    CC: cough Subjective:    Patient ID: Kristine Owens, female    DOB: 1930-10-16, 83 y.o.   MRN: [id number]  HPI: Kristine Owens is a 83 y.o. female presenting on 01/23/2017 for Sinus Problem (nasal and head congestion. Started 2 days ago. Taking Mucinex) and Cough (productive.)   2-3 d h/o nasal and congestion, productive cough, mild ST and PNdrainage.   No fevers/chills, ear or tooth pain. No dyspnea or wheezing.   Treating with mucinex and ibuprofen.  No sick contacts at home.  Non smoker No h/o asthma.   Relevant past medical, surgical, family and social history reviewed and updated as indicated. Interim medical history since our last visit reviewed. Allergies and medications reviewed and updated. Outpatient Medications Prior to Visit  Medication Sig Dispense Refill  . atenolol (TENORMIN) 25 MG tablet TAKE 1 TABLET TWICE DAILY 180 tablet 0  . b complex vitamins tablet Take 1 tablet by mouth daily.      . cholecalciferol (VITAMIN D) 1000 UNITS tablet Take 1,000 Units by mouth daily.      Marland Kitchen Dextromethorphan-Guaifenesin (MUCINEX DM) 30-600 MG TB12 Take 1 tablet by mouth as needed.    . hydrochlorothiazide (HYDRODIURIL) 25 MG tablet Take 1 tablet (25 mg total) by mouth daily. 90 tablet 3  . ibuprofen (ADVIL,MOTRIN) 200 MG tablet Take 200 mg by mouth every 6 (six) hours as needed.    Marland Kitchen losartan (COZAAR) 100 MG tablet Take 1 tablet (100 mg total) by mouth at bedtime. 90 tablet 1  . Multiple Vitamin (MULTIVITAMIN) tablet Take 1 tablet by mouth daily.      Marland Kitchen omeprazole (PRILOSEC OTC) 20 MG tablet Take 20 mg by mouth as needed.       No facility-administered medications prior to visit.      Per HPI unless specifically indicated in ROS section below Review of Systems     Objective:    BP  126/74 (BP Location: Left Arm, Patient Position: Sitting, Cuff Size: Normal)   Pulse 61   Temp 98.4 F (36.9 C) (Oral)   Wt 163 lb (73.9 kg)   SpO2 97%   BMI 27.12 kg/m   Wt Readings from Last 3 Encounters:  01/23/17 163 lb (73.9 kg)  05/27/16 162 lb 4 oz (73.6 kg)  04/29/16 161 lb 12 oz (73.4 kg)    Physical Exam  Constitutional: She appears well-developed and well-nourished. No distress.  Tired appearing  HENT:  Head: Normocephalic and atraumatic.  Right Ear: Hearing, tympanic membrane, external ear and ear canal normal.  Left Ear: Hearing, tympanic membrane, external ear and ear canal normal.  Nose: No mucosal edema or rhinorrhea. Right sinus exhibits no maxillary sinus tenderness and no frontal sinus tenderness. Left sinus exhibits no maxillary sinus tenderness and no frontal sinus tenderness.  Mouth/Throat: Uvula is midline, oropharynx is clear and moist and mucous membranes are normal. No oropharyngeal exudate, posterior oropharyngeal edema, posterior oropharyngeal erythema or tonsillar abscesses.  Eyes: Conjunctivae and EOM are normal. Pupils are equal, round, and reactive to light. No scleral icterus.  Neck: Normal range of motion. Neck supple.  Cardiovascular: Normal rate, regular rhythm, normal heart sounds and intact distal pulses.  No murmur heard. Pulmonary/Chest: Effort normal and breath sounds normal.  No respiratory distress. She has no wheezes. She has no rales.  Lymphadenopathy:    She has no cervical adenopathy.  Skin: Skin is warm and dry. No rash noted.  Nursing note and vitals reviewed.      Assessment & Plan:   Problem List Items Addressed This Visit    Viral URI with cough - Primary    Anticipate viral given short duration  Supportive care reviewed with patient.  Update if not improving with treatment.           Follow up plan: Return if symptoms worsen or fail to improve.  Ria Bush, MD

## 2017-01-23 NOTE — Patient Instructions (Addendum)
You have a viral upper respiratory infection. Antibiotics are not needed for this.  Viral infections usually take 7-10 days to resolve.  The cough can last a few weeks to go away. Add on 400mg  ibuprofen with meals for next 2-3 days.  Push fluids and plenty of rest. Please return if you are not improving as expected, or if you have high fevers (>101.5) or difficulty swallowing or worsening productive cough. Call clinic with questions.  Good to see you today. I hope you start feeling better soon.

## 2017-01-28 ENCOUNTER — Ambulatory Visit: Payer: Self-pay | Admitting: Family Medicine

## 2017-01-28 ENCOUNTER — Encounter: Payer: Self-pay | Admitting: Family Medicine

## 2017-01-28 ENCOUNTER — Ambulatory Visit
Admission: RE | Admit: 2017-01-28 | Discharge: 2017-01-28 | Disposition: A | Discharge status: Home / Self Care | Payer: Medicare Other | Source: Ambulatory Visit | Attending: Family Medicine | Admitting: Family Medicine

## 2017-01-28 DIAGNOSIS — Z1231 Encounter for screening mammogram for malignant neoplasm of breast: Secondary | ICD-10-CM | POA: Diagnosis not present

## 2017-01-28 LAB — HM MAMMOGRAPHY

## 2017-01-29 ENCOUNTER — Ambulatory Visit (INDEPENDENT_AMBULATORY_CARE_PROVIDER_SITE_OTHER): Payer: Medicare Other | Admitting: Family Medicine

## 2017-01-29 ENCOUNTER — Encounter: Payer: Self-pay | Admitting: Family Medicine

## 2017-01-29 VITALS — BP 126/70 | HR 59 | Temp 98.6°F | Ht 65.0 in | Wt 162.0 lb

## 2017-01-29 DIAGNOSIS — Z1211 Encounter for screening for malignant neoplasm of colon: Secondary | ICD-10-CM | POA: Diagnosis not present

## 2017-01-29 DIAGNOSIS — Z23 Encounter for immunization: Secondary | ICD-10-CM | POA: Diagnosis not present

## 2017-01-29 DIAGNOSIS — H9191 Unspecified hearing loss, right ear: Secondary | ICD-10-CM

## 2017-01-29 DIAGNOSIS — J069 Acute upper respiratory infection, unspecified: Secondary | ICD-10-CM

## 2017-01-29 DIAGNOSIS — I10 Essential (primary) hypertension: Secondary | ICD-10-CM | POA: Diagnosis not present

## 2017-01-29 DIAGNOSIS — B9789 Other viral agents as the cause of diseases classified elsewhere: Secondary | ICD-10-CM | POA: Diagnosis not present

## 2017-01-29 DIAGNOSIS — Z0000 Encounter for general adult medical examination without abnormal findings: Secondary | ICD-10-CM

## 2017-01-29 DIAGNOSIS — E785 Hyperlipidemia, unspecified: Secondary | ICD-10-CM

## 2017-01-29 DIAGNOSIS — Z7189 Other specified counseling: Secondary | ICD-10-CM | POA: Diagnosis not present

## 2017-01-29 DIAGNOSIS — H9190 Unspecified hearing loss, unspecified ear: Secondary | ICD-10-CM | POA: Insufficient documentation

## 2017-01-29 MED ORDER — HYDROCHLOROTHIAZIDE 12.5 MG PO TABS: 12.5000 mg | ORAL_TABLET | Freq: Every day | ORAL | 3 refills | Status: DC | PRN

## 2017-01-29 NOTE — Progress Notes (Addendum)
BP 126/70 (BP Location: Left Arm, Patient Position: Sitting, Cuff Size: Normal)   Pulse (!) 59   Temp 98.6 F (37 C) (Oral)   Ht 5\' 5"  (1.651 m)   Wt 162 lb (73.5 kg)   SpO2 99%   BMI 26.96 kg/m    CC: medicare wellness visit Subjective:    Patient ID: Kristine Owens, female    DOB: [birth year], 83 y.o.   MRN: [id number]  HPI: Kristine Owens is a 83 y.o. female presenting on 01/29/2017 for Medicare Wellness   Seen here last week with viral URI recommended supportive care - ongoing symptoms over last 10 days, now with hoarseness but no fever or productive cough.  Husband with Creutzfeldt-jakob prior disease.    Hearing Screening   125Hz  250Hz  500Hz  1000Hz  2000Hz  3000Hz  4000Hz  6000Hz  8000Hz   Right ear:   0 0 0  0    Left ear:   40 0 40  0    Comments: Says she cannot hear low tones and knows she needs hearing aids but does not want them.  Vision Screening Comments: Eye exam in 02/2016. Next exam, 02/2017 denies falls, denies depression. Declines audiology referral at this time.  Preventative: Colon cancer screening - 2010 Kristine Owens) normal, some ext hemorrhoids. Given h/o adenomatous polyps, rec rpt in 5 yrs. Pt did not complete iFOB 2017. Requests iFOB this year.  Mammogram - birads1 02-08-17. Does breast exams at home.  Well woman exam - s/p total hysterectomy, aged out.  DEXA - normal 2010.  Flu shot - yearly.  Pneumovax - 2003. prevnar 2015.  Td - 2013  zostavax - 2011.  shingrix - discussed Advanced directives: thinks wants daughter be HCPOA. Has this at home and will bring [place] a copy. Seat belt use discussed  Sunscreen use discussed. No changing moles. 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 Interim medical history since our last visit reviewed. Allergies and medications reviewed and updated. Outpatient Medications Prior to Visit  Medication Sig Dispense Refill  . atenolol (TENORMIN) 25 MG tablet TAKE 1 TABLET TWICE DAILY 180 tablet 0  . b complex vitamins tablet Take 1 tablet by mouth daily.      . cholecalciferol (VITAMIN D) 1000 UNITS tablet Take 1,000 Units by mouth daily.      Marland Kitchen Dextromethorphan-Guaifenesin (MUCINEX DM) 30-600 MG TB12 Take 1 tablet by mouth as needed.    Marland Kitchen ibuprofen (ADVIL,MOTRIN) 200 MG tablet Take 200 mg by mouth every 6 (six) hours as needed.    Marland Kitchen losartan (COZAAR) 100 MG tablet Take 1 tablet (100 mg total) by mouth at bedtime. 90 tablet 1  . Multiple Vitamin (MULTIVITAMIN) tablet Take 1 tablet by mouth daily.      Marland Kitchen omeprazole (PRILOSEC OTC) 20 MG tablet Take 20 mg by mouth as needed.      . hydrochlorothiazide (HYDRODIURIL) 25 MG tablet Take 1 tablet (25 mg total) by mouth daily. 90 tablet 3   No facility-administered medications prior to visit.      Per HPI unless specifically indicated in ROS section below Review of Systems  Objective:    BP 126/70 (BP Location: Left Arm, Patient Position: Sitting, Cuff Size: Normal)   Pulse (!) 59   Temp 98.6 F (37 C) (Oral)   Ht 5\' 5"  (1.651 m)   Wt 162 lb (73.5 kg)   SpO2 99%   BMI 26.96 kg/m   Wt Readings from Last 3 Encounters:  01/29/17 162 lb (73.5 kg)  01/23/17 163 lb (73.9 kg)  05/27/16 162 lb 4 oz (73.6 kg)    BP Readings from Last 3 Encounters:  01/29/17 126/70  01/23/17 126/74  05/27/16 130/80    Physical Exam  Constitutional: She is oriented to person, place, and time. She appears well-developed and well-nourished. No distress.  HENT:  Head: Normocephalic and  atraumatic.  Right Ear: Hearing, tympanic membrane, external ear and ear canal normal.  Left Ear: Hearing, tympanic membrane, external ear and ear canal normal.  Nose: Nose normal. No mucosal edema or rhinorrhea. Right sinus exhibits no maxillary sinus tenderness and no frontal sinus tenderness. Left sinus exhibits no maxillary sinus tenderness and no frontal sinus tenderness.  Mouth/Throat: Uvula is midline, oropharynx is clear and moist and mucous membranes are normal. No oropharyngeal exudate, posterior oropharyngeal edema, posterior oropharyngeal erythema or tonsillar abscesses.  Hoarse voice  Eyes: Conjunctivae and EOM are normal. Pupils are equal, round, and reactive to light. No scleral icterus.  Neck: Normal range of motion. Neck supple. Carotid bruit is not present. No thyromegaly present.  Cardiovascular: Normal rate, regular rhythm, normal heart sounds and intact distal pulses.  No murmur heard. Pulses:      Radial pulses are 2+ on the right side, and 2+ on the left side.  Pulmonary/Chest: Effort normal and breath sounds normal. No respiratory distress. She has no wheezes. She has no rales.  Abdominal: Soft. Bowel sounds are normal. She exhibits no distension and no mass. There is no tenderness. There is no rebound and no guarding.  Musculoskeletal: Normal range of motion. She exhibits no edema.  Lymphadenopathy:    She has no cervical adenopathy.  Neurological: She is alert and oriented to person, place, and time.  CN grossly intact, station and gait intact Recall 3/3 Calculation 5/5 serial 3s  Skin: Skin is warm and dry. No rash noted.  Psychiatric: She has a normal mood and affect. Her behavior is normal. Judgment and thought content normal.  Nursing note and vitals reviewed.  Results for orders placed or performed in visit on 01/28/17  HM MAMMOGRAPHY  Result Value Ref Range   HM Mammogram 0-4 Bi-Rad 0-4 Bi-Rad, Self Reported Normal      Assessment & Plan:   Problem  List Items Addressed This Visit    Advanced care planning/counseling discussion    Advanced directives: thinks wants daughter be HCPOA. Has this at home and will bring [place] a copy.      Essential hypertension    Chronic, stable. Continue current regimen.  Will decrease hydrochlorothiazide to 12.5mg  (with option for 25mg  daily) due to noted hyponatremia today.       Relevant Medications   hydrochlorothiazide (HYDRODIURIL) 12.5 MG tablet   Hearing loss    Declines audiology eval at this time.       HLD (hyperlipidemia)    Chronic, stable off meds.       Relevant Medications   hydrochlorothiazide (HYDRODIURIL) 12.5 MG tablet   Medicare annual wellness visit, subsequent - Primary    I have personally reviewed the Medicare Annual Wellness questionnaire and have noted 1. The patient's  medical and social history 2. Their use of alcohol, tobacco or illicit drugs 3. Their current medications and supplements 4. The patient's functional ability including ADL's, fall risks, home safety risks and hearing or visual impairment. Cognitive function has been assessed and addressed as indicated.  5. Diet and physical activity 6. Evidence for depression or mood disorders The patients weight, height, BMI have been recorded in the chart. I have made referrals, counseling and provided education to the patient based on review of the above and I have provided the pt with a written personalized care plan for preventive services. Provider list updated.. See scanned questionairre as needed for further documentation. Reviewed preventative protocols and updated unless pt declined.       Viral URI with cough    Now with laryngitis. Anticipate ongoing viral illness, 10 days into illness should be on mend. Supportive care reviewed. Update if new or persistent sxs for abx course. Pt agrees with plan.        Other Visit Diagnoses    Special screening for malignant neoplasms, colon       Relevant Orders    Fecal occult blood, imunochemical       Follow up plan: Return in about 1 year (around 01/29/2018) for annual exam, prior fasting for blood work.  Ria Bush, MD

## 2017-01-29 NOTE — Assessment & Plan Note (Signed)

## 2017-01-29 NOTE — Assessment & Plan Note (Signed)
Chronic, stable off meds.  

## 2017-01-29 NOTE — Addendum Note (Signed)
Addended by: Brenton Grills on: 35/39/1225 10:32 AM   Modules accepted: Orders

## 2017-01-29 NOTE — Patient Instructions (Addendum)
Flu shot today Pass by lab to pick up stool kit.  If interested, check with pharmacy about new 2 shot shingles series (shingrix).  Bring [place] a copy of your advanced directives to update chart.  Try decreasing hydrochlorothiazide to 12.5m daily, may take 270mif needed. You have persistent viral upper respiratory infection now with laryngitis. Continue supportive care as up to now. Let [place] if fever >101 or worsening for antibiotic course.  You are doing well today. Return as needed or in 1 year for next physical.   Health Maintenance, Female Adopting a healthy lifestyle and getting preventive care can go a long way to promote health and wellness. Talk with your health care provider about what schedule of regular examinations is right for you. This is a good chance for you to check in with your provider about disease prevention and staying healthy. In between checkups, there are plenty of things you can do on your own. Experts have done a lot of research about which lifestyle changes and preventive measures are most likely to keep you healthy. Ask your health care provider for more information. Weight and diet Eat a healthy diet  Be sure to include plenty of vegetables, fruits, low-fat dairy products, and lean protein.  Do not eat a lot of foods high in solid fats, added sugars, or salt.  Get regular exercise. This is one of the most important things you can do for your health. ? Most adults should exercise for at least 150 minutes each week. The exercise should increase your heart rate and make you sweat (moderate-intensity exercise). ? Most adults should also do strengthening exercises at least twice a week. This is in addition to the moderate-intensity exercise.  Maintain a healthy weight  Body mass index (BMI) is a measurement that can be used to identify possible weight problems. It estimates body fat based on height and weight. Your health care provider can help determine your BMI and  help you achieve or maintain a healthy weight.  For females 2024ears of age and older: ? A BMI below 18.5 is considered underweight. ? A BMI of 18.5 to 24.9 is normal. ? A BMI of 25 to 29.9 is considered overweight. ? A BMI of 30 and above is considered obese.  Watch levels of cholesterol and blood lipids  You should start having your blood tested for lipids and cholesterol at 2074ears of age, then have this test every 5 years.  You may need to have your cholesterol levels checked more often if: ? Your lipid or cholesterol levels are high. ? You are older than 503ears of age. ? You are at high risk for heart disease.  Cancer screening Lung Cancer  Lung cancer screening is recommended for adults [id number] old who are at high risk for lung cancer because of a history of smoking.  A yearly low-dose CT scan of the lungs is recommended for people who: ? Currently smoke. ? Have quit within the past 15 years. ? Have at least a 30-pack-year history of smoking. A pack year is smoking an average of one pack of cigarettes a day for 1 year.  Yearly screening should continue until it has been 15 years since you quit.  Yearly screening should stop if you develop a health problem that would prevent you from having lung cancer treatment.  Breast Cancer  Practice breast self-awareness. This means understanding how your breasts normally appear and feel.  It also means doing regular  breast self-exams. Let your health care provider know about any changes, no matter how small.  If you are in your 20s or 30s, you should have a clinical breast exam (CBE) by a health care provider every 1-3 years as part of a regular health exam.  If you are 41 or older, have a CBE every year. Also consider having a breast X-ray (mammogram) every year.  If you have a family history of breast cancer, talk to your health care provider about genetic screening.  If you are at high risk for breast cancer, talk to  your health care provider about having an MRI and a mammogram every year.  Breast cancer gene (BRCA) assessment is recommended for women who have family members with BRCA-related cancers. BRCA-related cancers include: ? Breast. ? Ovarian. ? Tubal. ? Peritoneal cancers.  Results of the assessment will determine the need for genetic counseling and BRCA1 and BRCA2 testing.  Cervical Cancer Your health care provider may recommend that you be screened regularly for cancer of the pelvic organs (ovaries, uterus, and vagina). This screening involves a pelvic examination, including checking for microscopic changes to the surface of your cervix (Pap test). You may be encouraged to have this screening done every 3 years, beginning at age 33.  For women ages 21-65, health care providers may recommend pelvic exams and Pap testing every 3 years, or they may recommend the Pap and pelvic exam, combined with testing for human papilloma virus (HPV), every 5 years. Some types of HPV increase your risk of cervical cancer. Testing for HPV may also be done on women of any age with unclear Pap test results.  Other health care providers may not recommend any screening for nonpregnant women who are considered low risk for pelvic cancer and who do not have symptoms. Ask your health care provider if a screening pelvic exam is right for you.  If you have had past treatment for cervical cancer or a condition that could lead to cancer, you need Pap tests and screening for cancer for at least 20 years after your treatment. If Pap tests have been discontinued, your risk factors (such as having a new sexual partner) need to be reassessed to determine if screening should resume. Some women have medical problems that increase the chance of getting cervical cancer. In these cases, your health care provider may recommend more frequent screening and Pap tests.  Colorectal Cancer  This type of cancer can be detected and often  prevented.  Routine colorectal cancer screening usually begins at 80 years of age and continues through 81 years of age.  Your health care provider may recommend screening at an earlier age if you have risk factors for colon cancer.  Your health care provider may also recommend using home test kits to check for hidden blood in the stool.  A small camera at the end of a tube can be used to examine your colon directly (sigmoidoscopy or colonoscopy). This is done to check for the earliest forms of colorectal cancer.  Routine screening usually begins at age 44.  Direct examination of the colon should be repeated every 5-10 years through 81 years of age. However, you may need to be screened more often if early forms of precancerous polyps or small growths are found.  Skin Cancer  Check your skin from head to toe regularly.  Tell your health care provider about any new moles or changes in moles, especially if there is a change in a mole's shape  or color.  Also tell your health care provider if you have a mole that is larger than the size of a pencil eraser.  Always use sunscreen. Apply sunscreen liberally and repeatedly throughout the day.  Protect yourself by wearing long sleeves, pants, a wide-brimmed hat, and sunglasses whenever you are outside.  Heart disease, diabetes, and high blood pressure  High blood pressure causes heart disease and increases the risk of stroke. High blood pressure is more likely to develop in: ? People who have blood pressure in the high end of the normal range (130-139/85-89 mm Hg). ? People who are overweight or obese. ? People who are African American.  If you are 37-79 years of age, have your blood pressure checked every 3-5 years. If you are 17 years of age or older, have your blood pressure checked every year. You should have your blood pressure measured twice-once when you are at a hospital or clinic, and once when you are not at a hospital or clinic.  Record the average of the two measurements. To check your blood pressure when you are not at a hospital or clinic, you can use: ? An automated blood pressure machine at a pharmacy. ? A home blood pressure monitor.  If you are between 69 years and 10 years old, ask your health care provider if you should take aspirin to prevent strokes.  Have regular diabetes screenings. This involves taking a blood sample to check your fasting blood sugar level. ? If you are at a normal weight and have a low risk for diabetes, have this test once every three years after 81 years of age. ? If you are overweight and have a high risk for diabetes, consider being tested at a younger age or more often. Preventing infection Hepatitis B  If you have a higher risk for hepatitis B, you should be screened for this virus. You are considered at high risk for hepatitis B if: ? You were born in a country where hepatitis B is common. Ask your health care provider which countries are considered high risk. ? Your parents were born in a high-risk country, and you have not been immunized against hepatitis B (hepatitis B vaccine). ? You have HIV or AIDS. ? You use needles to inject street drugs. ? You live with someone who has hepatitis B. ? You have had sex with someone who has hepatitis B. ? You get hemodialysis treatment. ? You take certain medicines for conditions, including cancer, organ transplantation, and autoimmune conditions.  Hepatitis C  Blood testing is recommended for: ? Everyone born from [birth year] through 1963. ? Anyone with known risk factors for hepatitis C.  Sexually transmitted infections (STIs)  You should be screened for sexually transmitted infections (STIs) including gonorrhea and chlamydia if: ? You are sexually active and are younger than 81 years of age. ? You are older than 81 years of age and your health care provider tells you that you are at risk for this type of infection. ? Your sexual  activity has changed since you were last screened and you are at an increased risk for chlamydia or gonorrhea. Ask your health care provider if you are at risk.  If you do not have HIV, but are at risk, it may be recommended that you take a prescription medicine daily to prevent HIV infection. This is called pre-exposure prophylaxis (PrEP). You are considered at risk if: ? You are sexually active and do not regularly use condoms or know the  HIV status of your partner(s). ? You take drugs by injection. ? You are sexually active with a partner who has HIV.  Talk with your health care provider about whether you are at high risk of being infected with HIV. If you choose to begin PrEP, you should first be tested for HIV. You should then be tested every 3 months for as long as you are taking PrEP. Pregnancy  If you are premenopausal and you may become pregnant, ask your health care provider about preconception counseling.  If you may become pregnant, take 400 to 800 micrograms (mcg) of folic acid every day.  If you want to prevent pregnancy, talk to your health care provider about birth control (contraception). Osteoporosis and menopause  Osteoporosis is a disease in which the bones lose minerals and strength with aging. This can result in serious bone fractures. Your risk for osteoporosis can be identified using a bone density scan.  If you are 79 years of age or older, or if you are at risk for osteoporosis and fractures, ask your health care provider if you should be screened.  Ask your health care provider whether you should take a calcium or vitamin D supplement to lower your risk for osteoporosis.  Menopause may have certain physical symptoms and risks.  Hormone replacement therapy may reduce some of these symptoms and risks. Talk to your health care provider about whether hormone replacement therapy is right for you. Follow these instructions at home:  Schedule regular health, dental,  and eye exams.  Stay current with your immunizations.  Do not use any tobacco products including cigarettes, chewing tobacco, or electronic cigarettes.  If you are pregnant, do not drink alcohol.  If you are breastfeeding, limit how much and how often you drink alcohol.  Limit alcohol intake to no more than 1 drink per day for nonpregnant women. One drink equals 12 ounces of beer, 5 ounces of wine, or 1 ounces of hard liquor.  Do not use street drugs.  Do not share needles.  Ask your health care provider for help if you need support or information about quitting drugs.  Tell your health care provider if you often feel depressed.  Tell your health care provider if you have ever been abused or do not feel safe at home. This information is not intended to replace advice given to you by your health care provider. Make sure you discuss any questions you have with your health care provider. Document Released: 08/05/2010 Document Revised: 06/28/2015 Document Reviewed: 10/24/2014 Elsevier Interactive Patient Education  [employer].

## 2017-01-29 NOTE — Assessment & Plan Note (Signed)
Declines audiology eval at this time.

## 2017-01-29 NOTE — Assessment & Plan Note (Signed)
Chronic, stable. Continue current regimen.  Will decrease hydrochlorothiazide to 12.5mg  (with option for 25mg  daily) due to noted hyponatremia today.

## 2017-01-29 NOTE — Assessment & Plan Note (Signed)
Now with laryngitis. Anticipate ongoing viral illness, 10 days into illness should be on mend. Supportive care reviewed. Update if new or persistent sxs for abx course. Pt agrees with plan.

## 2017-01-29 NOTE — Assessment & Plan Note (Signed)
Advanced directives: thinks wants daughter be HCPOA. Has this at home and will bring us a copy. 

## 2017-02-04 ENCOUNTER — Other Ambulatory Visit: Payer: Self-pay | Admitting: Family Medicine

## 2017-02-04 DIAGNOSIS — H40023 Open angle with borderline findings, high risk, bilateral: Secondary | ICD-10-CM | POA: Diagnosis not present

## 2017-02-04 DIAGNOSIS — H35373 Puckering of macula, bilateral: Secondary | ICD-10-CM | POA: Diagnosis not present

## 2017-02-04 DIAGNOSIS — Z961 Presence of intraocular lens: Secondary | ICD-10-CM | POA: Diagnosis not present

## 2017-02-04 DIAGNOSIS — H35033 Hypertensive retinopathy, bilateral: Secondary | ICD-10-CM | POA: Diagnosis not present

## 2017-02-04 DIAGNOSIS — H3554 Dystrophies primarily involving the retinal pigment epithelium: Secondary | ICD-10-CM | POA: Diagnosis not present

## 2017-02-11 ENCOUNTER — Encounter: Payer: Self-pay | Admitting: Family Medicine

## 2017-02-25 ENCOUNTER — Encounter: Payer: Self-pay | Admitting: Family Medicine

## 2017-02-25 ENCOUNTER — Other Ambulatory Visit: Payer: Medicare Other

## 2017-02-25 DIAGNOSIS — Z1211 Encounter for screening for malignant neoplasm of colon: Secondary | ICD-10-CM

## 2017-02-25 LAB — FECAL OCCULT BLOOD, IMMUNOCHEMICAL: Fecal Occult Bld: NEGATIVE

## 2017-02-25 LAB — FECAL OCCULT BLOOD, GUAIAC: Fecal Occult Blood: NEGATIVE

## 2017-04-01 DIAGNOSIS — M25552 Pain in left hip: Secondary | ICD-10-CM | POA: Diagnosis not present

## 2017-04-01 DIAGNOSIS — M545 Low back pain: Secondary | ICD-10-CM | POA: Diagnosis not present

## 2017-04-01 DIAGNOSIS — M62830 Muscle spasm of back: Secondary | ICD-10-CM | POA: Diagnosis not present

## 2017-04-01 DIAGNOSIS — M9903 Segmental and somatic dysfunction of lumbar region: Secondary | ICD-10-CM | POA: Diagnosis not present

## 2017-04-01 DIAGNOSIS — M5432 Sciatica, left side: Secondary | ICD-10-CM | POA: Diagnosis not present

## 2017-04-01 DIAGNOSIS — M9904 Segmental and somatic dysfunction of sacral region: Secondary | ICD-10-CM | POA: Diagnosis not present

## 2017-04-02 DIAGNOSIS — M9903 Segmental and somatic dysfunction of lumbar region: Secondary | ICD-10-CM | POA: Diagnosis not present

## 2017-04-02 DIAGNOSIS — M25552 Pain in left hip: Secondary | ICD-10-CM | POA: Diagnosis not present

## 2017-04-02 DIAGNOSIS — M9904 Segmental and somatic dysfunction of sacral region: Secondary | ICD-10-CM | POA: Diagnosis not present

## 2017-04-02 DIAGNOSIS — M62830 Muscle spasm of back: Secondary | ICD-10-CM | POA: Diagnosis not present

## 2017-04-02 DIAGNOSIS — M545 Low back pain: Secondary | ICD-10-CM | POA: Diagnosis not present

## 2017-04-02 DIAGNOSIS — M5432 Sciatica, left side: Secondary | ICD-10-CM | POA: Diagnosis not present

## 2017-04-03 ENCOUNTER — Other Ambulatory Visit: Payer: Self-pay

## 2017-04-03 ENCOUNTER — Ambulatory Visit (INDEPENDENT_AMBULATORY_CARE_PROVIDER_SITE_OTHER): Payer: Medicare Other | Admitting: Family Medicine

## 2017-04-03 ENCOUNTER — Encounter: Payer: Self-pay | Admitting: Family Medicine

## 2017-04-03 ENCOUNTER — Ambulatory Visit: Payer: Self-pay

## 2017-04-03 VITALS — BP 151/71 | HR 71 | Temp 98.5°F | Ht 65.0 in | Wt 168.5 lb

## 2017-04-03 DIAGNOSIS — I10 Essential (primary) hypertension: Secondary | ICD-10-CM

## 2017-04-03 NOTE — Telephone Encounter (Signed)
Pt. Reports she has been in the Chiropractor's office this week and has had elevated BP readings. This morning it was 215/80 - asymptomatic. Appointment made for today. Reason for Disposition . Systolic BP  >= 937 OR Diastolic >= 169  Answer Assessment - Initial Assessment Questions 1. BLOOD PRESSURE: "What is the blood pressure?" "Did you take at least two measurements 5 minutes apart?"     215/80 2. ONSET: "When did you take your blood pressure?"     This week 3. HOW: "How did you obtain the blood pressure?" (e.g., visiting nurse, automatic home BP monitor)     Chiropractor's office 4. HISTORY: "Do you have a history of high blood pressure?"     Yes 5. MEDICATIONS: "Are you taking any medications for blood pressure?" "Have you missed any doses recently?"     No missed doses 6. OTHER SYMPTOMS: "Do you have any symptoms?" (e.g., headache, chest pain, blurred vision, difficulty breathing, weakness)      Feeling tired 7. PREGNANCY: "Is there any chance you are pregnant?" "When was your last menstrual period?"     No  Protocols used: HIGH BLOOD PRESSURE-A-AH

## 2017-04-03 NOTE — Patient Instructions (Addendum)
Increase atenolol to  3 tabs total daily.  Work on stress  Follow blood pressures and pulse at home. Goal HR 60-90,  goal BP < 150/90.  Follow up with PCP in 2 weeks for BP check.

## 2017-04-03 NOTE — Assessment & Plan Note (Signed)
Increase likely due to decrease in HCTZ due to hyponatremia, pain as well as recent inactivity and increase in stress.  Increase atenolol to 75 mg daily ( not sure why pt splitting dose) but can continue.  Follow HR and BP at home.. Do not need tight control of BP given age May be able to decrease BP meds if pain and stress improving as well as return to regular activity.

## 2017-04-03 NOTE — Progress Notes (Signed)
   Subjective:    Patient ID: Kristine Owens, female    DOB: December 08, 1930, 84 y.o.   MRN: [id number]  HPI   84 year old female pt of Dr. Synthia Innocent who presents with recent increased blood pressure with out symptoms.  In last few days she has noted  BPs ranging 190-200/80s  She has been seeing chiropractor for sciatic pain. Pain was 7/10 now better 4/10.   BP 200/80 Mild tightness in head.    She reports  She is taking atenolol 25 mg BID,  HCTZ 12.5 mg x  1 tabs daily, losartan 100 mg daily  She is taking it lately regularly but HCTZ was decreased in dose given recent low sodium in 01/2017.  No new meds.   BP Readings from Last 3 Encounters:  04/03/17 (!) 151/71  01/29/17 126/70  01/23/17 126/74  Using medication without problems or lightheadedness:  none Chest pain with exertion:none Edema:none  Short of breath: none Average home BPs: Other issues:   Increase stress  Overall.  Blood pressure (!) 151/71, pulse 71, temperature 98.5 F (36.9 C), temperature source Oral, height 5\' 5"  (1.651 m), weight 168 lb 8 oz (76.4 kg).   Review of Systems  Constitutional: Negative for fatigue and fever.  HENT: Negative for ear pain.   Eyes: Negative for pain.  Respiratory: Negative for chest tightness and shortness of breath.   Cardiovascular: Negative for chest pain, palpitations and leg swelling.  Gastrointestinal: Negative for abdominal pain.  Genitourinary: Negative for dysuria.       Objective:   Physical Exam  Constitutional: Vital signs are normal. She appears well-developed and well-nourished. She is cooperative.  Non-toxic appearance. She does not appear ill. No distress.  HENT:  Head: Normocephalic.  Right Ear: Hearing, tympanic membrane, external ear and ear canal normal. Tympanic membrane is not erythematous, not retracted and not bulging.  Left Ear: Hearing, tympanic membrane, external ear and ear canal normal. Tympanic membrane is not erythematous, not retracted and  not bulging.  Nose: No mucosal edema or rhinorrhea. Right sinus exhibits no maxillary sinus tenderness and no frontal sinus tenderness. Left sinus exhibits no maxillary sinus tenderness and no frontal sinus tenderness.  Mouth/Throat: Uvula is midline, oropharynx is clear and moist and mucous membranes are normal.  Eyes: Conjunctivae, EOM and lids are normal. Pupils are equal, round, and reactive to light. Lids are everted and swept, no foreign bodies found.  Neck: Trachea normal and normal range of motion. Neck supple. Carotid bruit is not present. No thyroid mass and no thyromegaly present.  Cardiovascular: Normal rate, regular rhythm, S1 normal, S2 normal, normal heart sounds, intact distal pulses and normal pulses. Exam reveals no gallop and no friction rub.  No murmur heard. Pulmonary/Chest: Effort normal and breath sounds normal. No tachypnea. No respiratory distress. She has no decreased breath sounds. She has no wheezes. She has no rhonchi. She has no rales.  Abdominal: Soft. Normal appearance and bowel sounds are normal. There is no tenderness.  Neurological: She is alert.  Skin: Skin is warm, dry and intact. No rash noted.  Psychiatric: Her speech is normal and behavior is normal. Judgment and thought content normal. Her mood appears not anxious. Cognition and memory are normal. She does not exhibit a depressed mood.          Assessment & Plan:

## 2017-04-03 NOTE — Telephone Encounter (Signed)
Pt has appt 04/03/17 at 12 noon with Dr Diona Browner.

## 2017-04-06 DIAGNOSIS — M545 Low back pain: Secondary | ICD-10-CM | POA: Diagnosis not present

## 2017-04-06 DIAGNOSIS — M9903 Segmental and somatic dysfunction of lumbar region: Secondary | ICD-10-CM | POA: Diagnosis not present

## 2017-04-06 DIAGNOSIS — M25552 Pain in left hip: Secondary | ICD-10-CM | POA: Diagnosis not present

## 2017-04-06 DIAGNOSIS — M5432 Sciatica, left side: Secondary | ICD-10-CM | POA: Diagnosis not present

## 2017-04-06 DIAGNOSIS — M9904 Segmental and somatic dysfunction of sacral region: Secondary | ICD-10-CM | POA: Diagnosis not present

## 2017-04-06 DIAGNOSIS — M62830 Muscle spasm of back: Secondary | ICD-10-CM | POA: Diagnosis not present

## 2017-04-08 DIAGNOSIS — M545 Low back pain: Secondary | ICD-10-CM | POA: Diagnosis not present

## 2017-04-08 DIAGNOSIS — M9904 Segmental and somatic dysfunction of sacral region: Secondary | ICD-10-CM | POA: Diagnosis not present

## 2017-04-08 DIAGNOSIS — M25552 Pain in left hip: Secondary | ICD-10-CM | POA: Diagnosis not present

## 2017-04-08 DIAGNOSIS — M9903 Segmental and somatic dysfunction of lumbar region: Secondary | ICD-10-CM | POA: Diagnosis not present

## 2017-04-08 DIAGNOSIS — M5432 Sciatica, left side: Secondary | ICD-10-CM | POA: Diagnosis not present

## 2017-04-08 DIAGNOSIS — M62830 Muscle spasm of back: Secondary | ICD-10-CM | POA: Diagnosis not present

## 2017-04-09 DIAGNOSIS — M25552 Pain in left hip: Secondary | ICD-10-CM | POA: Diagnosis not present

## 2017-04-09 DIAGNOSIS — M62830 Muscle spasm of back: Secondary | ICD-10-CM | POA: Diagnosis not present

## 2017-04-09 DIAGNOSIS — M9904 Segmental and somatic dysfunction of sacral region: Secondary | ICD-10-CM | POA: Diagnosis not present

## 2017-04-09 DIAGNOSIS — M545 Low back pain: Secondary | ICD-10-CM | POA: Diagnosis not present

## 2017-04-09 DIAGNOSIS — M9903 Segmental and somatic dysfunction of lumbar region: Secondary | ICD-10-CM | POA: Diagnosis not present

## 2017-04-09 DIAGNOSIS — M5432 Sciatica, left side: Secondary | ICD-10-CM | POA: Diagnosis not present

## 2017-04-13 DIAGNOSIS — M9903 Segmental and somatic dysfunction of lumbar region: Secondary | ICD-10-CM | POA: Diagnosis not present

## 2017-04-13 DIAGNOSIS — M5432 Sciatica, left side: Secondary | ICD-10-CM | POA: Diagnosis not present

## 2017-04-13 DIAGNOSIS — M9904 Segmental and somatic dysfunction of sacral region: Secondary | ICD-10-CM | POA: Diagnosis not present

## 2017-04-13 DIAGNOSIS — M25552 Pain in left hip: Secondary | ICD-10-CM | POA: Diagnosis not present

## 2017-04-13 DIAGNOSIS — M62830 Muscle spasm of back: Secondary | ICD-10-CM | POA: Diagnosis not present

## 2017-04-13 DIAGNOSIS — M545 Low back pain: Secondary | ICD-10-CM | POA: Diagnosis not present

## 2017-04-15 DIAGNOSIS — M9904 Segmental and somatic dysfunction of sacral region: Secondary | ICD-10-CM | POA: Diagnosis not present

## 2017-04-15 DIAGNOSIS — M545 Low back pain: Secondary | ICD-10-CM | POA: Diagnosis not present

## 2017-04-15 DIAGNOSIS — M62830 Muscle spasm of back: Secondary | ICD-10-CM | POA: Diagnosis not present

## 2017-04-15 DIAGNOSIS — M5432 Sciatica, left side: Secondary | ICD-10-CM | POA: Diagnosis not present

## 2017-04-15 DIAGNOSIS — M25552 Pain in left hip: Secondary | ICD-10-CM | POA: Diagnosis not present

## 2017-04-15 DIAGNOSIS — M9903 Segmental and somatic dysfunction of lumbar region: Secondary | ICD-10-CM | POA: Diagnosis not present

## 2017-04-16 DIAGNOSIS — M25552 Pain in left hip: Secondary | ICD-10-CM | POA: Diagnosis not present

## 2017-04-16 DIAGNOSIS — M9903 Segmental and somatic dysfunction of lumbar region: Secondary | ICD-10-CM | POA: Diagnosis not present

## 2017-04-16 DIAGNOSIS — M545 Low back pain: Secondary | ICD-10-CM | POA: Diagnosis not present

## 2017-04-16 DIAGNOSIS — M5432 Sciatica, left side: Secondary | ICD-10-CM | POA: Diagnosis not present

## 2017-04-16 DIAGNOSIS — M62830 Muscle spasm of back: Secondary | ICD-10-CM | POA: Diagnosis not present

## 2017-04-16 DIAGNOSIS — M9904 Segmental and somatic dysfunction of sacral region: Secondary | ICD-10-CM | POA: Diagnosis not present

## 2017-04-17 ENCOUNTER — Ambulatory Visit (INDEPENDENT_AMBULATORY_CARE_PROVIDER_SITE_OTHER): Payer: Medicare Other | Admitting: Family Medicine

## 2017-04-17 ENCOUNTER — Encounter: Payer: Self-pay | Admitting: Family Medicine

## 2017-04-17 VITALS — BP 186/76 | HR 58 | Temp 98.2°F | Wt 167.0 lb

## 2017-04-17 DIAGNOSIS — I10 Essential (primary) hypertension: Secondary | ICD-10-CM | POA: Diagnosis not present

## 2017-04-17 LAB — TSH: TSH: 2.42 u[IU]/mL (ref 0.35–4.50)

## 2017-04-17 LAB — BASIC METABOLIC PANEL
BUN: 26 mg/dL — AB (ref 6–23)
CO2: 31 mEq/L (ref 19–32)
Calcium: 9.6 mg/dL (ref 8.4–10.5)
Chloride: 103 mEq/L (ref 96–112)
Creatinine, Ser: 0.77 mg/dL (ref 0.40–1.20)
GFR: 75.85 mL/min (ref >60.00)
Glucose, Bld: 77 mg/dL (ref 70–99)
POTASSIUM: 3.6 meq/L (ref 3.5–5.1)
Sodium: 140 mEq/L (ref 135–145)

## 2017-04-17 MED ORDER — AMLODIPINE BESYLATE 5 MG PO TABS: 5.0000 mg | ORAL_TABLET | Freq: Every day | ORAL | 6 refills | Status: DC

## 2017-04-17 NOTE — Patient Instructions (Addendum)
Continue atenolol 50mg /25mg  daily.  Continue losartan 100mg  daily.  Continue hydrochlorothiazide 12.5mg  daily.  Start new medicine amlodipine 5mg  daily.  Watch for dizziness symptoms.  Return in 2-3 weeks for follow up visit

## 2017-04-17 NOTE — Progress Notes (Signed)
BP (!) 186/76 (BP Location: Right Arm, Cuff Size: Normal)   Pulse (!) 58   Temp 98.2 F (36.8 C) (Oral)   Wt 167 lb (75.8 kg)   SpO2 99%   BMI 27.79 kg/m    CC: HTN f/u visit Subjective:    Patient ID: Kristine Owens, female    DOB: 09-07-1930, 84 y.o.   MRN: [id number]  HPI: Kristine Owens is a 84 y.o. female presenting on 04/17/2017 for Hypertension (Here for f/u. Recently increased atenolol to 3 tabs daily. Home BP this morning about 7:30 was 200/87.)   Saw Dr Diona Browner last week, note reviewed. BPs at home [id number] systolic despite compliance with atenolol 25mg  2/1 dosing, HCTZ 12.5mg , losartan 100mg . She is stressed - dealing with property for sale, deciding on new housing.  Denies significant sodium in diet.   Denies HA, vision changes, CP/tightness, SOB, leg swelling. She does feel lightheaded with elevated readings.   She has been seeing chiropractor for L sciatica.   Relevant past medical, surgical, family and social history reviewed and updated as indicated. Interim medical history since our last visit reviewed. Allergies and medications reviewed and updated. Outpatient Medications Prior to Visit  Medication Sig Dispense Refill  . atenolol (TENORMIN) 25 MG tablet TAKE 1 TABLET TWICE DAILY (Patient taking differently: Takes 1 tablet 3 times daily) 180 tablet 3  . b complex vitamins tablet Take 1 tablet by mouth daily.      . cholecalciferol (VITAMIN D) 1000 UNITS tablet Take 1,000 Units by mouth daily.      Marland Kitchen Dextromethorphan-Guaifenesin (MUCINEX DM) 30-600 MG TB12 Take 1 tablet by mouth as needed.    . hydrochlorothiazide (HYDRODIURIL) 12.5 MG tablet Take 1-2 tablets (12.5-25 mg total) by mouth daily as needed. 180 tablet 3  . ibuprofen (ADVIL,MOTRIN) 200 MG tablet Take 200 mg by mouth every 6 (six) hours as needed.    Marland Kitchen losartan (COZAAR) 100 MG tablet Take 1 tablet (100 mg total) by mouth at bedtime. 90 tablet 1  . Multiple Vitamin (MULTIVITAMIN) tablet Take  1 tablet by mouth daily.      Marland Kitchen omeprazole (PRILOSEC OTC) 20 MG tablet Take 20 mg by mouth as needed.       No facility-administered medications prior to visit.      Per HPI unless specifically indicated in ROS section below Review of Systems     Objective:    BP (!) 186/76 (BP Location: Right Arm, Cuff Size: Normal)   Pulse (!) 58   Temp 98.2 F (36.8 C) (Oral)   Wt 167 lb (75.8 kg)   SpO2 99%   BMI 27.79 kg/m   Wt Readings from Last 3 Encounters:  04/17/17 167 lb (75.8 kg)  04/03/17 168 lb 8 oz (76.4 kg)  01/29/17 162 lb (73.5 kg)    Physical Exam  Constitutional: She appears well-developed and well-nourished. No distress.  HENT:  Mouth/Throat: Oropharynx is clear and moist. No oropharyngeal exudate.  Eyes: Conjunctivae are normal. Pupils are equal, round, and reactive to light.  Cardiovascular: Normal rate, regular rhythm, normal heart sounds and intact distal pulses.  No murmur heard. Pulmonary/Chest: Effort normal and breath sounds normal. No respiratory distress. She has no wheezes. She has no rales.  Musculoskeletal: She exhibits no edema.  Skin: Skin is warm and dry. No rash noted.  Psychiatric: She has a normal mood and affect.  Nursing note and vitals reviewed.  Lab Results  Component Value Date   TSH 2.80 01/22/2017  Assessment & Plan:   Problem List Items Addressed This Visit    Essential hypertension - Primary    Chronic, deteriorated. Will start amlodipine 5mg  daily, continue other current regimen. Update BMP, TSH today. RTC 3 wks close f/u.  rec low sodium diet.  Reviewed stressors and their contribution to hypertension. Reviewed stress relieving strategies.       Relevant Medications   amLODipine (NORVASC) 5 MG tablet   Other Relevant Orders   TSH   Basic metabolic panel       Meds ordered this encounter  Medications  . DISCONTD: amLODipine (NORVASC) 5 MG tablet    Sig: Take 1 tablet (5 mg total) by mouth daily.    Dispense:  30  tablet    Refill:  6  . amLODipine (NORVASC) 5 MG tablet    Sig: Take 1 tablet (5 mg total) by mouth daily.    Dispense:  30 tablet    Refill:  6   Orders Placed This Encounter  Procedures  . TSH  . Basic metabolic panel    Follow up plan: Return in about 3 weeks (around 05/08/2017) for follow up visit.  Ria Bush, MD

## 2017-04-17 NOTE — Assessment & Plan Note (Addendum)
Chronic, deteriorated. Will start amlodipine 5mg  daily, continue other current regimen. Update BMP, TSH today. RTC 3 wks close f/u.  rec low sodium diet.  Reviewed stressors and their contribution to hypertension. Reviewed stress relieving strategies.

## 2017-04-20 DIAGNOSIS — M9904 Segmental and somatic dysfunction of sacral region: Secondary | ICD-10-CM | POA: Diagnosis not present

## 2017-04-20 DIAGNOSIS — M25552 Pain in left hip: Secondary | ICD-10-CM | POA: Diagnosis not present

## 2017-04-20 DIAGNOSIS — M62830 Muscle spasm of back: Secondary | ICD-10-CM | POA: Diagnosis not present

## 2017-04-20 DIAGNOSIS — M545 Low back pain: Secondary | ICD-10-CM | POA: Diagnosis not present

## 2017-04-20 DIAGNOSIS — M9903 Segmental and somatic dysfunction of lumbar region: Secondary | ICD-10-CM | POA: Diagnosis not present

## 2017-04-20 DIAGNOSIS — M5432 Sciatica, left side: Secondary | ICD-10-CM | POA: Diagnosis not present

## 2017-04-22 DIAGNOSIS — M9903 Segmental and somatic dysfunction of lumbar region: Secondary | ICD-10-CM | POA: Diagnosis not present

## 2017-04-22 DIAGNOSIS — M25552 Pain in left hip: Secondary | ICD-10-CM | POA: Diagnosis not present

## 2017-04-22 DIAGNOSIS — M5432 Sciatica, left side: Secondary | ICD-10-CM | POA: Diagnosis not present

## 2017-04-22 DIAGNOSIS — M9904 Segmental and somatic dysfunction of sacral region: Secondary | ICD-10-CM | POA: Diagnosis not present

## 2017-04-22 DIAGNOSIS — M545 Low back pain: Secondary | ICD-10-CM | POA: Diagnosis not present

## 2017-04-22 DIAGNOSIS — M62830 Muscle spasm of back: Secondary | ICD-10-CM | POA: Diagnosis not present

## 2017-04-23 DIAGNOSIS — M62830 Muscle spasm of back: Secondary | ICD-10-CM | POA: Diagnosis not present

## 2017-04-23 DIAGNOSIS — M25552 Pain in left hip: Secondary | ICD-10-CM | POA: Diagnosis not present

## 2017-04-23 DIAGNOSIS — M9903 Segmental and somatic dysfunction of lumbar region: Secondary | ICD-10-CM | POA: Diagnosis not present

## 2017-04-23 DIAGNOSIS — M545 Low back pain: Secondary | ICD-10-CM | POA: Diagnosis not present

## 2017-04-23 DIAGNOSIS — M9904 Segmental and somatic dysfunction of sacral region: Secondary | ICD-10-CM | POA: Diagnosis not present

## 2017-04-23 DIAGNOSIS — M5432 Sciatica, left side: Secondary | ICD-10-CM | POA: Diagnosis not present

## 2017-04-27 DIAGNOSIS — M9903 Segmental and somatic dysfunction of lumbar region: Secondary | ICD-10-CM | POA: Diagnosis not present

## 2017-04-27 DIAGNOSIS — M62830 Muscle spasm of back: Secondary | ICD-10-CM | POA: Diagnosis not present

## 2017-04-27 DIAGNOSIS — M5432 Sciatica, left side: Secondary | ICD-10-CM | POA: Diagnosis not present

## 2017-04-27 DIAGNOSIS — M545 Low back pain: Secondary | ICD-10-CM | POA: Diagnosis not present

## 2017-04-27 DIAGNOSIS — M25552 Pain in left hip: Secondary | ICD-10-CM | POA: Diagnosis not present

## 2017-04-27 DIAGNOSIS — M9904 Segmental and somatic dysfunction of sacral region: Secondary | ICD-10-CM | POA: Diagnosis not present

## 2017-04-29 DIAGNOSIS — M9904 Segmental and somatic dysfunction of sacral region: Secondary | ICD-10-CM | POA: Diagnosis not present

## 2017-04-29 DIAGNOSIS — M9903 Segmental and somatic dysfunction of lumbar region: Secondary | ICD-10-CM | POA: Diagnosis not present

## 2017-04-29 DIAGNOSIS — M545 Low back pain: Secondary | ICD-10-CM | POA: Diagnosis not present

## 2017-04-29 DIAGNOSIS — M25552 Pain in left hip: Secondary | ICD-10-CM | POA: Diagnosis not present

## 2017-04-29 DIAGNOSIS — M5432 Sciatica, left side: Secondary | ICD-10-CM | POA: Diagnosis not present

## 2017-04-29 DIAGNOSIS — M62830 Muscle spasm of back: Secondary | ICD-10-CM | POA: Diagnosis not present

## 2017-05-01 DIAGNOSIS — H1013 Acute atopic conjunctivitis, bilateral: Secondary | ICD-10-CM | POA: Diagnosis not present

## 2017-05-01 DIAGNOSIS — H01003 Unspecified blepharitis right eye, unspecified eyelid: Secondary | ICD-10-CM | POA: Diagnosis not present

## 2017-05-01 DIAGNOSIS — H40023 Open angle with borderline findings, high risk, bilateral: Secondary | ICD-10-CM | POA: Diagnosis not present

## 2017-05-01 DIAGNOSIS — H04123 Dry eye syndrome of bilateral lacrimal glands: Secondary | ICD-10-CM | POA: Diagnosis not present

## 2017-05-04 DIAGNOSIS — M5432 Sciatica, left side: Secondary | ICD-10-CM | POA: Diagnosis not present

## 2017-05-04 DIAGNOSIS — M62830 Muscle spasm of back: Secondary | ICD-10-CM | POA: Diagnosis not present

## 2017-05-04 DIAGNOSIS — M9903 Segmental and somatic dysfunction of lumbar region: Secondary | ICD-10-CM | POA: Diagnosis not present

## 2017-05-04 DIAGNOSIS — M9904 Segmental and somatic dysfunction of sacral region: Secondary | ICD-10-CM | POA: Diagnosis not present

## 2017-05-04 DIAGNOSIS — M25552 Pain in left hip: Secondary | ICD-10-CM | POA: Diagnosis not present

## 2017-05-04 DIAGNOSIS — M545 Low back pain: Secondary | ICD-10-CM | POA: Diagnosis not present

## 2017-05-06 DIAGNOSIS — M9904 Segmental and somatic dysfunction of sacral region: Secondary | ICD-10-CM | POA: Diagnosis not present

## 2017-05-06 DIAGNOSIS — M9903 Segmental and somatic dysfunction of lumbar region: Secondary | ICD-10-CM | POA: Diagnosis not present

## 2017-05-06 DIAGNOSIS — M25552 Pain in left hip: Secondary | ICD-10-CM | POA: Diagnosis not present

## 2017-05-06 DIAGNOSIS — M5432 Sciatica, left side: Secondary | ICD-10-CM | POA: Diagnosis not present

## 2017-05-06 DIAGNOSIS — M545 Low back pain: Secondary | ICD-10-CM | POA: Diagnosis not present

## 2017-05-06 DIAGNOSIS — M62830 Muscle spasm of back: Secondary | ICD-10-CM | POA: Diagnosis not present

## 2017-05-11 DIAGNOSIS — M5432 Sciatica, left side: Secondary | ICD-10-CM | POA: Diagnosis not present

## 2017-05-11 DIAGNOSIS — M25552 Pain in left hip: Secondary | ICD-10-CM | POA: Diagnosis not present

## 2017-05-11 DIAGNOSIS — M545 Low back pain: Secondary | ICD-10-CM | POA: Diagnosis not present

## 2017-05-11 DIAGNOSIS — M62830 Muscle spasm of back: Secondary | ICD-10-CM | POA: Diagnosis not present

## 2017-05-11 DIAGNOSIS — M9904 Segmental and somatic dysfunction of sacral region: Secondary | ICD-10-CM | POA: Diagnosis not present

## 2017-05-11 DIAGNOSIS — M9903 Segmental and somatic dysfunction of lumbar region: Secondary | ICD-10-CM | POA: Diagnosis not present

## 2017-05-14 DIAGNOSIS — M5432 Sciatica, left side: Secondary | ICD-10-CM | POA: Diagnosis not present

## 2017-05-14 DIAGNOSIS — M545 Low back pain: Secondary | ICD-10-CM | POA: Diagnosis not present

## 2017-05-14 DIAGNOSIS — M62830 Muscle spasm of back: Secondary | ICD-10-CM | POA: Diagnosis not present

## 2017-05-14 DIAGNOSIS — M25552 Pain in left hip: Secondary | ICD-10-CM | POA: Diagnosis not present

## 2017-05-14 DIAGNOSIS — M9904 Segmental and somatic dysfunction of sacral region: Secondary | ICD-10-CM | POA: Diagnosis not present

## 2017-05-14 DIAGNOSIS — M9903 Segmental and somatic dysfunction of lumbar region: Secondary | ICD-10-CM | POA: Diagnosis not present

## 2017-05-31 ENCOUNTER — Other Ambulatory Visit: Payer: Self-pay | Admitting: Family Medicine

## 2017-06-15 ENCOUNTER — Telehealth: Payer: Self-pay

## 2017-06-15 NOTE — Telephone Encounter (Signed)
Copied from [place] [id number]. Topic: General - Other >> Jun 15, 2017 10:56 AM Carolyn Stare wrote:  Pt call to say every since she been taking the below med her ankles are swelling    amLODipine (NORVASC) 5 MG tablet

## 2017-06-15 NOTE — Telephone Encounter (Signed)
Left detailed message on vm per DPR.  

## 2017-06-15 NOTE — Telephone Encounter (Signed)
Recommend she stop the Amlodipine, monitor her BP, and follow up with Dr. Danise Mina in 1-2 weeks with BP logs. Please schedule.

## 2017-06-18 ENCOUNTER — Encounter: Payer: Self-pay | Admitting: Family Medicine

## 2017-06-18 ENCOUNTER — Ambulatory Visit (INDEPENDENT_AMBULATORY_CARE_PROVIDER_SITE_OTHER): Payer: Medicare Other | Admitting: Family Medicine

## 2017-06-18 VITALS — BP 166/66 | HR 59 | Temp 97.9°F | Ht 65.0 in | Wt 168.2 lb

## 2017-06-18 DIAGNOSIS — I10 Essential (primary) hypertension: Secondary | ICD-10-CM

## 2017-06-18 MED ORDER — TRIAMTERENE-HCTZ 37.5-25 MG PO TABS: 1.0000 | ORAL_TABLET | Freq: Every day | ORAL | 6 refills | Status: DC

## 2017-06-18 NOTE — Assessment & Plan Note (Signed)
Chronic, uncontrolled. Amlodipine caused pedal edema so will stop. Start maxzide in place of hctz. Continue atenolol and losartan. Pt agrees with plan.  Reviewed correct technique to check blood pressure.  Discussed healthy stress relieving strategies.

## 2017-06-18 NOTE — Progress Notes (Addendum)
BP (!) 166/66 (BP Location: Right Arm, Cuff Size: Normal)   Pulse (!) 59   Temp 97.9 F (36.6 C) (Oral)   Ht 5\' 5"  (1.651 m)   Wt 168 lb 4 oz (76.3 kg)   SpO2 97%   BMI 28.00 kg/m    CC: ankle swelling Subjective:    Patient ID: Kristine Owens, female    DOB: 1931/01/16, 84 y.o.   MRN: [id number]  HPI: Kristine Owens is a 84 y.o. female presenting on 06/18/2017 for Joint Swelling (C/o swelling in bilateral ankles, worse in left. Mostly swell in the evenings. Noticed about 1 mo ago after starting amlodipine.  Pt was advised to stop taking it. )   See prior notes for details. We had recently started amlodipine 5mg  daily - this led to increased ankle swelling. This was started in setting of high blood pressures (099-833A systolic) related to increased stressors at home.   She continues atenolol 25mg  bid and losartan 100mg  daily, and hctz 12.5mg  daily. Amlodipine was stopped 2 days ago.   Brings BP log showing BP averaging 250-539J systolic, HR 67-34  Reviewed healthy stress relieving strategies.   Relevant past medical, surgical, family and social history reviewed and updated as indicated. Interim medical history since our last visit reviewed. Allergies and medications reviewed and updated. Outpatient Medications Prior to Visit  Medication Sig Dispense Refill  . atenolol (TENORMIN) 25 MG tablet TAKE 1 TABLET TWICE DAILY (Patient taking differently: Takes 1 tablet 3 times daily) 180 tablet 3  . b complex vitamins tablet Take 1 tablet by mouth daily.      . cholecalciferol (VITAMIN D) 1000 UNITS tablet Take 1,000 Units by mouth daily.      Marland Kitchen Dextromethorphan-Guaifenesin (MUCINEX DM) 30-600 MG TB12 Take 1 tablet by mouth as needed.    Marland Kitchen ibuprofen (ADVIL,MOTRIN) 200 MG tablet Take 200 mg by mouth every 6 (six) hours as needed.    Marland Kitchen losartan (COZAAR) 100 MG tablet TAKE 1 TABLET (100 MG TOTAL) BY MOUTH AT BEDTIME. 90 tablet 1  . Multiple Vitamin (MULTIVITAMIN) tablet Take 1  tablet by mouth daily.      Marland Kitchen omeprazole (PRILOSEC OTC) 20 MG tablet Take 20 mg by mouth as needed.      . hydrochlorothiazide (HYDRODIURIL) 12.5 MG tablet Take 1-2 tablets (12.5-25 mg total) by mouth daily as needed. 180 tablet 3  . losartan (COZAAR) 100 MG tablet Take 1 tablet (100 mg total) by mouth at bedtime. 90 tablet 1  . amLODipine (NORVASC) 5 MG tablet Take 1 tablet (5 mg total) by mouth daily. (Patient not taking: Reported on 06/18/2017) 30 tablet 6   No facility-administered medications prior to visit.      Per HPI unless specifically indicated in ROS section below Review of Systems     Objective:    BP (!) 166/66 (BP Location: Right Arm, Cuff Size: Normal)   Pulse (!) 59   Temp 97.9 F (36.6 C) (Oral)   Ht 5\' 5"  (1.651 m)   Wt 168 lb 4 oz (76.3 kg)   SpO2 97%   BMI 28.00 kg/m   Wt Readings from Last 3 Encounters:  06/18/17 168 lb 4 oz (76.3 kg)  04/17/17 167 lb (75.8 kg)  04/03/17 168 lb 8 oz (76.4 kg)    Physical Exam  Constitutional: She appears well-developed and well-nourished. No distress.  HENT:  Mouth/Throat: Oropharynx is clear and moist. No oropharyngeal exudate.  Cardiovascular: Normal rate, regular rhythm and normal  heart sounds.  No murmur heard. Pulmonary/Chest: Effort normal and breath sounds normal. No respiratory distress. She has no wheezes. She has no rales.  Musculoskeletal: She exhibits no edema.  Nursing note and vitals reviewed.  Results for orders placed or performed in visit on 04/17/17  TSH  Result Value Ref Range   TSH 2.42 0.35 - 4.50 uIU/mL  Basic metabolic panel  Result Value Ref Range   Sodium 140 135 - 145 mEq/L   Potassium 3.6 3.5 - 5.1 mEq/L   Chloride 103 96 - 112 mEq/L   CO2 31 19 - 32 mEq/L   Glucose, Bld 77 70 - 99 mg/dL   BUN 26 (H) 6 - 23 mg/dL   Creatinine, Ser 0.77 0.40 - 1.20 mg/dL   Calcium 9.6 8.4 - 10.5 mg/dL   GFR 75.85 >60.00 mL/min      Assessment & Plan:   Problem List Items Addressed This Visit     Essential hypertension - Primary    Chronic, uncontrolled. Amlodipine caused pedal edema so will stop. Start maxzide in place of hctz. Continue atenolol and losartan. Pt agrees with plan.  Reviewed correct technique to check blood pressure.  Discussed healthy stress relieving strategies.      Relevant Medications   triamterene-hydrochlorothiazide (MAXZIDE-25) 37.5-25 MG tablet   Other Relevant Orders   Basic metabolic panel       Meds ordered this encounter  Medications  . triamterene-hydrochlorothiazide (MAXZIDE-25) 37.5-25 MG tablet    Sig: Take 1 tablet by mouth daily.    Dispense:  30 tablet    Refill:  6    In place of hctz   Orders Placed This Encounter  Procedures  . Basic metabolic panel    Standing Status:   Future    Standing Expiration Date:   06/19/2018    Follow up plan: Return in about 1 month (around 07/16/2017) for follow up visit.  Ria Bush, MD

## 2017-06-18 NOTE — Patient Instructions (Addendum)
Stay off norvasc. Continue losartan 100mg  daily. Continue atenolol 25mg  twice daily.  Start maxzide in place of hctz - new water pill.  Return in 1 week for labs. Update [place] with blood pressure readings over the next 2 weeks.

## 2017-06-25 ENCOUNTER — Other Ambulatory Visit (INDEPENDENT_AMBULATORY_CARE_PROVIDER_SITE_OTHER): Payer: Medicare Other

## 2017-06-25 DIAGNOSIS — I10 Essential (primary) hypertension: Secondary | ICD-10-CM | POA: Diagnosis not present

## 2017-06-25 LAB — BASIC METABOLIC PANEL
BUN: 23 mg/dL (ref 6–23)
CALCIUM: 9.9 mg/dL (ref 8.4–10.5)
CO2: 29 mEq/L (ref 19–32)
CREATININE: 1.29 mg/dL — AB (ref 0.40–1.20)
Chloride: 103 mEq/L (ref 96–112)
GFR: 41.8 mL/min — ABNORMAL LOW (ref >60.00)
GLUCOSE: 92 mg/dL (ref 70–99)
Potassium: 4.6 mEq/L (ref 3.5–5.1)
Sodium: 140 mEq/L (ref 135–145)

## 2017-06-27 ENCOUNTER — Other Ambulatory Visit: Payer: Self-pay | Admitting: Family Medicine

## 2017-07-22 ENCOUNTER — Ambulatory Visit (INDEPENDENT_AMBULATORY_CARE_PROVIDER_SITE_OTHER): Payer: Medicare Other | Admitting: Family Medicine

## 2017-07-22 ENCOUNTER — Encounter: Payer: Self-pay | Admitting: Family Medicine

## 2017-07-22 VITALS — BP 170/70 | HR 55 | Temp 98.4°F | Ht 65.0 in | Wt 165.2 lb

## 2017-07-22 DIAGNOSIS — G4700 Insomnia, unspecified: Secondary | ICD-10-CM | POA: Diagnosis not present

## 2017-07-22 DIAGNOSIS — I10 Essential (primary) hypertension: Secondary | ICD-10-CM

## 2017-07-22 MED ORDER — VALSARTAN-HYDROCHLOROTHIAZIDE 320-25 MG PO TABS: 1.0000 | ORAL_TABLET | Freq: Every day | ORAL | 6 refills | Status: DC

## 2017-07-22 NOTE — Assessment & Plan Note (Signed)
Reviewed sleep hygiene measures She does regularly drink 2 glasses of red wine at night - suggested change to lunch time as this may be contributing to sleep maintenance insomnia. Hesitant to prescribe non-benzo hypnotic given high side effect and poor safety profile

## 2017-07-22 NOTE — Progress Notes (Signed)
BP (!) 170/70 (BP Location: Right Arm, Cuff Size: Normal)   Pulse (!) 55   Temp 98.4 F (36.9 C) (Oral)   Ht 5\' 5"  (1.651 m)   Wt 165 lb 4 oz (75 kg)   SpO2 93%   BMI 27.50 kg/m    CC: 1 mo fu/ visit  Subjective:    Patient ID: Kristine Owens, female    DOB: May 03, 1930, 84 y.o.   MRN: [id number]  HPI: Kristine Owens is a 84 y.o. female presenting on 07/22/2017 for Hypertension (Here for 1 mo f/u.)   See prior notes for details - difficult to control HTN , amlodipine caused leg swelling so stopped. We started maxzide in place of hctz. She continues atenolol 25mg  bid and losartan 100mg  qhs. However, maxzide may have caused bump in creatinine from 0.77 --> 1.29 so this was changed back to plain HCTZ 25mg  daily in am.   At home BP runs elevated - she brings log: am readings elevated 169-678 systolic, pm readings better 130-160s. Home cuff has previously been compared and accurate with our cuff.   Denies HA, vision changes, CP/tightness, SOB, leg swelling. Denies low blood pressure symptoms.   Ongoing sleep maintenance insomnia. No improvement with CVD oil at night, OTC benadryl and melatonin, and small amt ambien. Couple glasses of wine at bedtime. Has good bedtime routine. Reviewed sleep hygiene.  Relevant past medical, surgical, family and social history reviewed and updated as indicated. Interim medical history since our last visit reviewed. Allergies and medications reviewed and updated. Outpatient Medications Prior to Visit  Medication Sig Dispense Refill  . atenolol (TENORMIN) 25 MG tablet TAKE 1 TABLET TWICE DAILY (Patient taking differently: Takes 1 tablet 3 times daily) 180 tablet 3  . b complex vitamins tablet Take 1 tablet by mouth daily.      . cholecalciferol (VITAMIN D) 1000 UNITS tablet Take 1,000 Units by mouth daily.      Marland Kitchen Dextromethorphan-Guaifenesin (MUCINEX DM) 30-600 MG TB12 Take 1 tablet by mouth as needed.    . hydrochlorothiazide (HYDRODIURIL) 25  MG tablet Take 1 tablet (25 mg total) by mouth daily.    Marland Kitchen ibuprofen (ADVIL,MOTRIN) 200 MG tablet Take 200 mg by mouth every 6 (six) hours as needed.    Marland Kitchen losartan (COZAAR) 100 MG tablet TAKE 1 TABLET (100 MG TOTAL) BY MOUTH AT BEDTIME. 90 tablet 1  . Multiple Vitamin (MULTIVITAMIN) tablet Take 1 tablet by mouth daily.      Marland Kitchen omeprazole (PRILOSEC OTC) 20 MG tablet Take 20 mg by mouth as needed.       No facility-administered medications prior to visit.      Per HPI unless specifically indicated in ROS section below Review of Systems     Objective:    BP (!) 170/70 (BP Location: Right Arm, Cuff Size: Normal)   Pulse (!) 55   Temp 98.4 F (36.9 C) (Oral)   Ht 5\' 5"  (1.651 m)   Wt 165 lb 4 oz (75 kg)   SpO2 93%   BMI 27.50 kg/m   Wt Readings from Last 3 Encounters:  07/22/17 165 lb 4 oz (75 kg)  06/18/17 168 lb 4 oz (76.3 kg)  04/17/17 167 lb (75.8 kg)    Physical Exam  Constitutional: She appears well-developed and well-nourished. No distress.  HENT:  Mouth/Throat: Oropharynx is clear and moist. No oropharyngeal exudate.  Cardiovascular: Normal rate, regular rhythm and normal heart sounds.  No murmur heard. Pulmonary/Chest: Effort normal and  breath sounds normal. No respiratory distress. She has no wheezes. She has no rales.  Musculoskeletal: She exhibits no edema.  Nursing note and vitals reviewed.  Results for orders placed or performed in visit on 62/37/62  Basic metabolic panel  Result Value Ref Range   Sodium 140 135 - 145 mEq/L   Potassium 4.6 3.5 - 5.1 mEq/L   Chloride 103 96 - 112 mEq/L   CO2 29 19 - 32 mEq/L   Glucose, Bld 92 70 - 99 mg/dL   BUN 23 6 - 23 mg/dL   Creatinine, Ser 1.29 (H) 0.40 - 1.20 mg/dL   Calcium 9.9 8.4 - 10.5 mg/dL   GFR 41.80 (L) >60.00 mL/min      Assessment & Plan:   Problem List Items Addressed This Visit    Insomnia    Reviewed sleep hygiene measures She does regularly drink 2 glasses of red wine at night - suggested change to  lunch time as this may be contributing to sleep maintenance insomnia. Hesitant to prescribe non-benzo hypnotic given high side effect and poor safety profile      Essential hypertension - Primary    Chronic, uncontrolled. Did not tolerate amlodipine. Will Rx diovan HCT in place of losartan and hctz. Continue atenolol. RTC 1 wk labs, RTC 1 mo f/u visit. Pt agrees with plan.       Relevant Medications   valsartan-hydrochlorothiazide (DIOVAN HCT) 320-25 MG tablet   Other Relevant Orders   Basic metabolic panel       Meds ordered this encounter  Medications  . valsartan-hydrochlorothiazide (DIOVAN HCT) 320-25 MG tablet    Sig: Take 1 tablet by mouth daily.    Dispense:  30 tablet    Refill:  6    To take in place of plain losartan and hctz   Orders Placed This Encounter  Procedures  . Basic metabolic panel    Standing Status:   Future    Standing Expiration Date:   07/23/2018    Follow up plan: Return in about 1 month (around 08/19/2017) for follow up visit.  Ria Bush, MD

## 2017-07-22 NOTE — Assessment & Plan Note (Signed)
Chronic, uncontrolled. Did not tolerate amlodipine. Will Rx diovan HCT in place of losartan and hctz. Continue atenolol. RTC 1 wk labs, RTC 1 mo f/u visit. Pt agrees with plan.

## 2017-07-22 NOTE — Patient Instructions (Addendum)
Blood pressure staying elevated. Start combo pill diovan hctz in place of losartan and hydrochlorothiazide. Take in the morning.  Continue atenolol. Return 1 week after starting new medicine for lab visit only.  Return in 1 month for follow up visit.  Try to back off night time alcohol to see if any improvement in staying asleep.

## 2017-08-05 ENCOUNTER — Other Ambulatory Visit (INDEPENDENT_AMBULATORY_CARE_PROVIDER_SITE_OTHER): Payer: Medicare Other

## 2017-08-05 DIAGNOSIS — I10 Essential (primary) hypertension: Secondary | ICD-10-CM | POA: Diagnosis not present

## 2017-08-05 LAB — BASIC METABOLIC PANEL
BUN: 15 mg/dL (ref 6–23)
CHLORIDE: 96 meq/L (ref 96–112)
CO2: 31 mEq/L (ref 19–32)
Calcium: 9.4 mg/dL (ref 8.4–10.5)
Creatinine, Ser: 0.85 mg/dL (ref 0.40–1.20)
GFR: 67.62 mL/min (ref >60.00)
GLUCOSE: 103 mg/dL — AB (ref 70–99)
POTASSIUM: 3.4 meq/L — AB (ref 3.5–5.1)
SODIUM: 135 meq/L (ref 135–145)

## 2017-08-13 ENCOUNTER — Other Ambulatory Visit: Payer: Self-pay | Admitting: Family Medicine

## 2017-08-13 MED ORDER — POTASSIUM CHLORIDE ER 10 MEQ PO TBCR: 10.0000 meq | EXTENDED_RELEASE_TABLET | ORAL | 11 refills | Status: DC

## 2017-08-21 ENCOUNTER — Ambulatory Visit (INDEPENDENT_AMBULATORY_CARE_PROVIDER_SITE_OTHER): Payer: Medicare Other | Admitting: Family Medicine

## 2017-08-21 ENCOUNTER — Encounter: Payer: Self-pay | Admitting: Family Medicine

## 2017-08-21 VITALS — BP 120/78 | HR 61 | Temp 97.8°F | Ht 65.0 in | Wt 163.8 lb

## 2017-08-21 DIAGNOSIS — I10 Essential (primary) hypertension: Secondary | ICD-10-CM | POA: Diagnosis not present

## 2017-08-21 DIAGNOSIS — G4700 Insomnia, unspecified: Secondary | ICD-10-CM

## 2017-08-21 MED ORDER — HYDRALAZINE HCL 25 MG PO TABS: 25.0000 mg | ORAL_TABLET | Freq: Two times a day (BID) | ORAL | 3 refills | Status: DC | PRN

## 2017-08-21 NOTE — Assessment & Plan Note (Addendum)
Chronic, remains uncontrolled despite addition of diovan hct high dose. Will continue this and atenolol, add hydralazine 25mg  PRN SBP >160. Could also consider re-trial of lower amlodipine dose (ankle swelling at high dose).

## 2017-08-21 NOTE — Patient Instructions (Addendum)
Do full dose of diovan hctz in the morning. Continue atenolol twice daily Start hydralazine 25mg  twice daily as needed for systolic blood pressure >543. Return in 3 months for follow up visit.

## 2017-08-21 NOTE — Assessment & Plan Note (Signed)
Improved with less alcohol at bedtime.

## 2017-08-21 NOTE — Progress Notes (Signed)
BP 120/78 (BP Location: Left Arm, Patient Position: Sitting, Cuff Size: Normal)   Pulse 61   Temp 97.8 F (36.6 C) (Oral)   Ht 5\' 5"  (1.651 m)   Wt 163 lb 12 oz (74.3 kg)   SpO2 97%   BMI 27.25 kg/m    CC: HTN f/u visit Subjective:    Patient ID: Kristine Owens, female    DOB: [birth year], 84 y.o.   MRN: [id number]  HPI: Kristine Owens is a 84 y.o. female presenting on 08/21/2017 for Hypertension (Here for 1 mo f/u.)   See prior note for details.  HTN - Compliant with current antihypertensive regimen of atenolol 25mg  bid, diovan hctz daily (new addition). Large pill - she's been cutting in half and taking bid. Does check blood pressures at home and brings log - 130-180/60-80s, HR 50-60s. No low blood pressure readings or symptoms of dizziness/syncope. No significant orthostatic dizziness. Denies HA, vision changes, CP/tightness, SOB, leg swelling.    She changed wine to lunch time, or hardly drinking any at all. Using CBD drops at night as well.   Relevant past medical, surgical, family and social history reviewed and updated as indicated. Interim medical history since our last visit reviewed. Allergies and medications reviewed and updated. Outpatient Medications Prior to Visit  Medication Sig Dispense Refill  . atenolol (TENORMIN) 25 MG tablet TAKE 1 TABLET TWICE DAILY (Patient taking differently: Takes 1 tablet 3 times daily) 180 tablet 3  . b complex vitamins tablet Take 1 tablet by mouth daily.      . cholecalciferol (VITAMIN D) 1000 UNITS tablet Take 1,000 Units by mouth daily.      Marland Kitchen Dextromethorphan-Guaifenesin (MUCINEX DM) 30-600 MG TB12 Take 1 tablet by mouth as needed.    Marland Kitchen ibuprofen (ADVIL,MOTRIN) 200 MG tablet Take 200 mg by mouth every 6 (six) hours as needed.    . Multiple Vitamin (MULTIVITAMIN) tablet Take 1 tablet by mouth daily.      Marland Kitchen omeprazole (PRILOSEC OTC) 20 MG tablet Take 20 mg by mouth as needed.      . potassium chloride (K-DUR) 10 MEQ tablet  Take 1 tablet (10 mEq total) by mouth every Monday, Wednesday, and Friday. 30 tablet 11  . valsartan-hydrochlorothiazide (DIOVAN HCT) 320-25 MG tablet Take 1 tablet by mouth daily. 30 tablet 6   No facility-administered medications prior to visit.      Per HPI unless specifically indicated in ROS section below Review of Systems     Objective:    BP 120/78 (BP Location: Left Arm, Patient Position: Sitting, Cuff Size: Normal)   Pulse 61   Temp 97.8 F (36.6 C) (Oral)   Ht 5\' 5"  (1.651 m)   Wt 163 lb 12 oz (74.3 kg)   SpO2 97%   BMI 27.25 kg/m   Wt Readings from Last 3 Encounters:  08/21/17 163 lb 12 oz (74.3 kg)  07/22/17 165 lb 4 oz (75 kg)  06/18/17 168 lb 4 oz (76.3 kg)    Physical Exam  Constitutional: She appears well-developed and well-nourished. No distress.  HENT:  Mouth/Throat: Oropharynx is clear and moist. No oropharyngeal exudate.  Cardiovascular: Normal rate, regular rhythm and normal heart sounds.  No murmur heard. Pulmonary/Chest: Effort normal and breath sounds normal. No stridor. No respiratory distress. She has no rales.  Musculoskeletal: She exhibits no edema.  Nursing note and vitals reviewed.  Results for orders placed or performed in visit on 28/41/32  Basic metabolic panel  Result Value  Ref Range   Sodium 135 135 - 145 mEq/L   Potassium 3.4 (L) 3.5 - 5.1 mEq/L   Chloride 96 96 - 112 mEq/L   CO2 31 19 - 32 mEq/L   Glucose, Bld 103 (H) 70 - 99 mg/dL   BUN 15 6 - 23 mg/dL   Creatinine, Ser 0.85 0.40 - 1.20 mg/dL   Calcium 9.4 8.4 - 10.5 mg/dL   GFR 67.62 >60.00 mL/min      Assessment & Plan:   Problem List Items Addressed This Visit    Insomnia    Improved with less alcohol at bedtime.       Essential hypertension - Primary    Chronic, remains uncontrolled despite addition of diovan hct high dose. Will continue this and atenolol, add hydralazine 25mg  PRN SBP >160. Could also consider re-trial of lower amlodipine dose (ankle swelling at high  dose).       Relevant Medications   hydrALAZINE (APRESOLINE) 25 MG tablet       Meds ordered this encounter  Medications  . hydrALAZINE (APRESOLINE) 25 MG tablet    Sig: Take 1 tablet (25 mg total) by mouth 2 (two) times daily as needed (SBP >160).    Dispense:  30 tablet    Refill:  3   No orders of the defined types were placed in this encounter.   Follow up plan: Return in about 3 months (around 11/21/2017) for follow up visit.  Ria Bush, MD

## 2017-10-01 DIAGNOSIS — L570 Actinic keratosis: Secondary | ICD-10-CM | POA: Diagnosis not present

## 2017-10-01 DIAGNOSIS — L820 Inflamed seborrheic keratosis: Secondary | ICD-10-CM | POA: Diagnosis not present

## 2017-10-01 DIAGNOSIS — L821 Other seborrheic keratosis: Secondary | ICD-10-CM | POA: Diagnosis not present

## 2017-10-29 ENCOUNTER — Other Ambulatory Visit: Payer: Self-pay | Admitting: Family Medicine

## 2017-11-18 ENCOUNTER — Other Ambulatory Visit: Payer: Self-pay | Admitting: Family Medicine

## 2017-11-24 ENCOUNTER — Encounter: Payer: Self-pay | Admitting: Family Medicine

## 2017-11-24 ENCOUNTER — Encounter (INDEPENDENT_AMBULATORY_CARE_PROVIDER_SITE_OTHER): Payer: Self-pay

## 2017-11-24 ENCOUNTER — Ambulatory Visit (INDEPENDENT_AMBULATORY_CARE_PROVIDER_SITE_OTHER): Payer: Medicare Other | Admitting: Family Medicine

## 2017-11-24 VITALS — BP 118/82 | HR 57 | Temp 97.6°F | Ht 65.0 in | Wt 163.5 lb

## 2017-11-24 DIAGNOSIS — I10 Essential (primary) hypertension: Secondary | ICD-10-CM | POA: Diagnosis not present

## 2017-11-24 MED ORDER — HYDRALAZINE HCL 25 MG PO TABS: 25.0000 mg | ORAL_TABLET | Freq: Every day | ORAL | 1 refills | Status: DC

## 2017-11-24 NOTE — Progress Notes (Signed)
BP 118/82 (BP Location: Left Arm, Patient Position: Sitting, Cuff Size: Normal)   Pulse (!) 57   Temp 97.6 F (36.4 C) (Oral)   Ht 5\' 5"  (1.651 m)   Wt 163 lb 8 oz (74.2 kg)   SpO2 98%   BMI 27.21 kg/m    CC: 3 mo HTN f/u visit Subjective:    Patient ID: Kristine Owens, female    DOB: 1930-05-21, 84 y.o.   MRN: [id number]  HPI: Kristine Owens is a 84 y.o. female presenting on 11/24/2017 for Hypertension (Here for 3 mo f/u.)   See prior note for details HTN - Compliant with current antihypertensive regimen of diovan hct, atenolol, last visit we added hydralazine 25mg  PRN SBP >160. Does check blood pressures at home: morning readings 160-170, afternoon readings 130.  No low blood pressure readings or symptoms of dizziness/syncope.  Denies HA, vision changes, CP/tightness, SOB, leg swelling.   We also started Kdur 56mEq MWF  Relevant past medical, surgical, family and social history reviewed and updated as indicated. Interim medical history since our last visit reviewed. Allergies and medications reviewed and updated. Outpatient Medications Prior to Visit  Medication Sig Dispense Refill  . atenolol (TENORMIN) 25 MG tablet TAKE 1 TABLET TWICE DAILY 180 tablet 0  . b complex vitamins tablet Take 1 tablet by mouth daily.      . cholecalciferol (VITAMIN D) 1000 UNITS tablet Take 1,000 Units by mouth daily.      Marland Kitchen Dextromethorphan-Guaifenesin (MUCINEX DM) 30-600 MG TB12 Take 1 tablet by mouth as needed.    Marland Kitchen ibuprofen (ADVIL,MOTRIN) 200 MG tablet Take 200 mg by mouth every 6 (six) hours as needed.    . Multiple Vitamin (MULTIVITAMIN) tablet Take 1 tablet by mouth daily.      Marland Kitchen omeprazole (PRILOSEC OTC) 20 MG tablet Take 20 mg by mouth as needed.      . potassium chloride (K-DUR) 10 MEQ tablet Take 1 tablet (10 mEq total) by mouth every Monday, Wednesday, and Friday. 30 tablet 11  . valsartan-hydrochlorothiazide (DIOVAN HCT) 320-25 MG tablet Take 1 tablet by mouth daily. 30  tablet 6  . hydrALAZINE (APRESOLINE) 25 MG tablet TAKE 1 TABLET TWICE DAILY AS NEEDED FOR SYSTOLIC BLOOD PRESSURE GREATER THAN 160 30 tablet 0   No facility-administered medications prior to visit.      Per HPI unless specifically indicated in ROS section below Review of Systems     Objective:    BP 118/82 (BP Location: Left Arm, Patient Position: Sitting, Cuff Size: Normal)   Pulse (!) 57   Temp 97.6 F (36.4 C) (Oral)   Ht 5\' 5"  (1.651 m)   Wt 163 lb 8 oz (74.2 kg)   SpO2 98%   BMI 27.21 kg/m   Wt Readings from Last 3 Encounters:  11/24/17 163 lb 8 oz (74.2 kg)  08/21/17 163 lb 12 oz (74.3 kg)  07/22/17 165 lb 4 oz (75 kg)    Physical Exam  Constitutional: She appears well-developed and well-nourished. No distress.  Cardiovascular: Normal rate, regular rhythm and normal heart sounds.  No murmur heard. Pulmonary/Chest: Effort normal and breath sounds normal. No respiratory distress. She has no wheezes. She has no rales.  Musculoskeletal: She exhibits no edema.  Psychiatric: She has a normal mood and affect.  Nursing note and vitals reviewed.  Results for orders placed or performed in visit on 02/63/78  Basic metabolic panel  Result Value Ref Range   Sodium 135 135 -  145 mEq/L   Potassium 3.4 (L) 3.5 - 5.1 mEq/L   Chloride 96 96 - 112 mEq/L   CO2 31 19 - 32 mEq/L   Glucose, Bld 103 (H) 70 - 99 mg/dL   BUN 15 6 - 23 mg/dL   Creatinine, Ser 0.85 0.40 - 1.20 mg/dL   Calcium 9.4 8.4 - 10.5 mg/dL   GFR 67.62 >60.00 mL/min      Assessment & Plan:   Problem List Items Addressed This Visit    Essential hypertension - Primary    Chronic. Persistent AM elevations - will schedule hydralazine 25mg  every morning with 2nd dose PRN SBP >160. Update with effect. rec return for AMW towards end of December.       Relevant Medications   hydrALAZINE (APRESOLINE) 25 MG tablet       Meds ordered this encounter  Medications  . hydrALAZINE (APRESOLINE) 25 MG tablet    Sig:  Take 1 tablet (25 mg total) by mouth daily. May take second dose if needed SBP >160    Dispense:  120 tablet    Refill:  1   No orders of the defined types were placed in this encounter.   Follow up plan: No follow-ups on file.  Ria Bush, MD

## 2017-11-24 NOTE — Patient Instructions (Addendum)
Continue current medicines. Schedule hydralazine 25mg  every morning. May take second dose if needed (BP >160). Update me with readings in 2-3 weeks to make sure staying well controlled.

## 2017-11-24 NOTE — Assessment & Plan Note (Signed)
Chronic. Persistent AM elevations - will schedule hydralazine 25mg  every morning with 2nd dose PRN SBP >160. Update with effect. rec return for AMW towards end of December.

## 2017-12-23 ENCOUNTER — Other Ambulatory Visit: Payer: Self-pay | Admitting: Family Medicine

## 2017-12-23 DIAGNOSIS — Z1231 Encounter for screening mammogram for malignant neoplasm of breast: Secondary | ICD-10-CM

## 2017-12-25 ENCOUNTER — Encounter (INDEPENDENT_AMBULATORY_CARE_PROVIDER_SITE_OTHER): Payer: Medicare Other | Admitting: Ophthalmology

## 2018-01-10 ENCOUNTER — Other Ambulatory Visit: Payer: Self-pay | Admitting: Family Medicine

## 2018-01-11 NOTE — Telephone Encounter (Signed)
Electronic refill request Last office visit 11/24/17 Last refill 07/22/17 #30/6 Upcoming appointment 02/01/18 See allergy/contraindication

## 2018-01-20 ENCOUNTER — Other Ambulatory Visit: Payer: Self-pay | Admitting: Family Medicine

## 2018-01-23 ENCOUNTER — Other Ambulatory Visit: Payer: Self-pay | Admitting: Family Medicine

## 2018-01-23 DIAGNOSIS — R7303 Prediabetes: Secondary | ICD-10-CM

## 2018-01-23 DIAGNOSIS — E785 Hyperlipidemia, unspecified: Secondary | ICD-10-CM

## 2018-01-25 ENCOUNTER — Other Ambulatory Visit (INDEPENDENT_AMBULATORY_CARE_PROVIDER_SITE_OTHER): Payer: Medicare Other

## 2018-01-25 DIAGNOSIS — E785 Hyperlipidemia, unspecified: Secondary | ICD-10-CM

## 2018-01-25 DIAGNOSIS — R7303 Prediabetes: Secondary | ICD-10-CM | POA: Diagnosis not present

## 2018-01-25 LAB — COMPREHENSIVE METABOLIC PANEL
ALK PHOS: 61 U/L (ref 39–117)
ALT: 16 U/L (ref 0–35)
AST: 13 U/L (ref 0–37)
Albumin: 4.1 g/dL (ref 3.5–5.2)
BUN: 14 mg/dL (ref 6–23)
CO2: 30 mEq/L (ref 19–32)
Calcium: 9.3 mg/dL (ref 8.4–10.5)
Chloride: 97 mEq/L (ref 96–112)
Creatinine, Ser: 0.89 mg/dL (ref 0.40–1.20)
GFR: 64.05 mL/min (ref >60.00)
Glucose, Bld: 110 mg/dL — ABNORMAL HIGH (ref 70–99)
Potassium: 3.5 mEq/L (ref 3.5–5.1)
Sodium: 135 mEq/L (ref 135–145)
TOTAL PROTEIN: 6.6 g/dL (ref 6.0–8.3)
Total Bilirubin: 0.5 mg/dL (ref 0.2–1.2)

## 2018-01-25 LAB — LIPID PANEL
Cholesterol: 189 mg/dL (ref 0–200)
HDL: 67.4 mg/dL (ref >39.00)
LDL Cholesterol: 101 mg/dL — ABNORMAL HIGH (ref 0–99)
NonHDL: 121.31
Total CHOL/HDL Ratio: 3
Triglycerides: 100 mg/dL (ref 0.0–149.0)
VLDL: 20 mg/dL (ref 0.0–40.0)

## 2018-01-25 LAB — HEMOGLOBIN A1C: Hgb A1c MFr Bld: 5.5 % (ref 4.6–6.5)

## 2018-01-31 ENCOUNTER — Encounter: Payer: Self-pay | Admitting: Family Medicine

## 2018-01-31 NOTE — Progress Notes (Signed)
BP 110/60 (BP Location: Right Arm, Patient Position: Sitting, Cuff Size: Normal)   Pulse 63   Temp (!) 97.5 F (36.4 C) (Oral)   Ht 5\' 5"  (1.651 m)   Wt 164 lb (74.4 kg)   SpO2 97%   BMI 27.29 kg/m    CC: AMW f/u visit  Subjective:    Patient ID: Kristine Owens, female    DOB: 14-May-1930, 84 y.o.   MRN: [id number]  HPI: Kristine Owens is a 84 y.o. female presenting on 02/01/2018 for Annual Exam (Part 2)   Tolerating BP meds well.   Seeing Katha Cabal today for medicare wellness visit. Note will be reviewed.   Preventative: Colon cancer screening - 2010 Ardis Hughs) normal, some ext hemorrhoids. Given h/o adenomatous polyps, rec rpt in 5 yrs. iFOB neg 02/2017.  Mammogram -birads1 02/16/2017.Does breast exams at home.rpt scheduled later this week. Well woman exam- s/ptotalhysterectomy, aged out.  DEXA - normal 2010.  Flu shot - yearly. Pneumovax - 2003. prevnar 2015. Td - 2013  zostavax - 2011. shingrix - discussed  Advanced directives: thinks wants daughter be HCPOA. Has this at home and will bring [place] a copy. Seat belt use discussed  Sunscreen use discussed. No changing moles. Sees derm yearly Non smoker Alcohol - 1-2 glasses wine nightly Dentist q6 mo Eye exam yearly - cataracts removed.   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 Interim medical history since our last visit reviewed. Allergies and medications reviewed and updated. Outpatient Medications Prior to Visit  Medication Sig Dispense Refill  . atenolol (TENORMIN) 25 MG tablet TAKE 1 TABLET TWICE  DAILY 180 tablet 0  . b complex vitamins tablet Take 1 tablet by mouth daily.      . cholecalciferol (VITAMIN D) 1000 UNITS tablet Take 1,000 Units by mouth daily.      Marland Kitchen Dextromethorphan-Guaifenesin (MUCINEX DM) 30-600 MG TB12 Take 1 tablet by mouth as needed.    . hydrALAZINE (APRESOLINE) 25 MG tablet Take 1 tablet (25 mg total) by mouth daily. May take second dose if needed SBP >160 120 tablet 1  . ibuprofen (ADVIL,MOTRIN) 200 MG tablet Take 200 mg by mouth every 6 (six) hours as needed.    . Multiple Vitamin (MULTIVITAMIN) tablet Take 1 tablet by mouth daily.      Marland Kitchen omeprazole (PRILOSEC OTC) 20 MG tablet Take 20 mg by mouth as needed.      . potassium chloride (K-DUR) 10 MEQ tablet Take 1 tablet (10 mEq total) by mouth every Monday, Wednesday, and Friday. 30 tablet 11  . valsartan-hydrochlorothiazide (DIOVAN-HCT) 320-25 MG tablet TAKE 1 TABLET BY MOUTH EVERY DAY 90 tablet 0   No facility-administered medications prior to visit.      Per HPI unless specifically indicated in ROS section below Review of Systems Objective:    BP 110/60 (BP Location: Right Arm, Patient Position: Sitting, Cuff Size: Normal)   Pulse 63   Temp (!) 97.5 F (36.4 C) (Oral)   Ht 5\' 5"  (1.651 m)   Wt 164 lb (74.4 kg)   SpO2 97%   BMI 27.29 kg/m   Wt Readings from Last 3 Encounters:  02/01/18 164 lb (74.4 kg)  02/01/18 164 lb (74.4 kg)  11/24/17 163 lb 8 oz (74.2 kg)    Physical Exam Vitals signs and nursing note reviewed.  Constitutional:      General: She is not in acute distress.    Appearance: She is well-developed.  HENT:     Head: Normocephalic and atraumatic.     Right Ear: Hearing, tympanic membrane, ear canal and external ear normal.     Left Ear: Hearing, tympanic membrane, ear canal and external ear normal.     Nose: Nose normal.     Mouth/Throat:     Pharynx: Uvula midline. No oropharyngeal exudate or posterior oropharyngeal erythema.  Eyes:     General: No scleral icterus.     Conjunctiva/sclera: Conjunctivae normal.     Pupils: Pupils are equal, round, and reactive to light.  Neck:     Musculoskeletal: Normal range of motion and neck supple.  Cardiovascular:     Rate and Rhythm: Normal rate and regular rhythm.     Pulses:          Radial pulses are 2+ on the right side and 2+ on the left side.     Heart sounds: Normal heart sounds. No murmur.  Pulmonary:     Effort: Pulmonary effort is normal. No respiratory distress.     Breath sounds: Normal breath sounds. No wheezing or rales.  Abdominal:     General: Bowel sounds are normal. There is no distension.     Palpations: Abdomen is soft. There is no mass.     Tenderness: There is no abdominal tenderness. There is no guarding or rebound.  Musculoskeletal: Normal range of motion.  Lymphadenopathy:     Cervical: No cervical adenopathy.  Skin:    General: Skin is warm and dry.     Findings: No rash.  Neurological:     Mental Status: She is alert and oriented to person, place, and time.     Comments: CN grossly intact, station and gait intact  Psychiatric:        Behavior: Behavior normal.        Thought Content: Thought content normal.        Judgment: Judgment normal.       Results for orders placed or performed in visit on 01/25/18  Hemoglobin A1c  Result Value Ref Range   Hgb A1c MFr Bld 5.5 4.6 - 6.5 %  Lipid panel  Result Value Ref Range   Cholesterol 189 0 - 200 mg/dL   Triglycerides 100.0 0.0 - 149.0 mg/dL   HDL 67.40 >39.00 mg/dL   VLDL 20.0 0.0 - 40.0 mg/dL   LDL Cholesterol 101 (H) 0 - 99 mg/dL   Total CHOL/HDL Ratio 3    NonHDL 121.31   Comprehensive metabolic panel  Result Value Ref Range   Sodium 135 135 - 145 mEq/L   Potassium 3.5 3.5 - 5.1 mEq/L   Chloride 97 96 - 112 mEq/L   CO2 30 19 - 32 mEq/L   Glucose, Bld 110 (H) 70 - 99 mg/dL   BUN 14 6 - 23 mg/dL   Creatinine, Ser 0.89 0.40 - 1.20 mg/dL   Total Bilirubin 0.5 0.2 - 1.2 mg/dL   Alkaline Phosphatase 61 39 - 117 U/L    AST 13 0 - 37 U/L   ALT 16 0 - 35 U/L   Total Protein 6.6 6.0 - 8.3 g/dL   Albumin 4.1 3.5 - 5.2 g/dL   Calcium  9.3 8.4 - 10.5 mg/dL   GFR 64.05 >60.00 mL/min   Assessment & Plan:   Problem List Items Addressed This Visit    Prediabetes    Fasting impaired sugars but A1c actually normal range. Will continue to watch.       HLD (hyperlipidemia)    Chronic, stable off meds. Continue to monitor. The ASCVD Risk score Mikey Bussing DC Jr., et al., 2013) failed to calculate for the following reasons:   The 2013 ASCVD risk score is only valid for ages 14 to 61       Relevant Medications   valsartan-hydrochlorothiazide (DIOVAN-HCT) 320-25 MG tablet   Essential hypertension    Chronic, improved. Continue current regimen of diovan hct with scheduled hydralazine 25mg  in am.       Relevant Medications   valsartan-hydrochlorothiazide (DIOVAN-HCT) 320-25 MG tablet   Advanced care planning/counseling discussion - Primary    Advanced directives: thinks wants daughter be HCPOA. Has this at home and will bring [place] a copy.       Other Visit Diagnoses    Special screening for malignant neoplasms, colon       Relevant Orders   Fecal occult blood, imunochemical       Meds ordered this encounter  Medications  . valsartan-hydrochlorothiazide (DIOVAN-HCT) 320-25 MG tablet    Sig: Take 1 tablet by mouth daily.    Dispense:  90 tablet    Refill:  3   Orders Placed This Encounter  Procedures  . Fecal occult blood, imunochemical    Standing Status:   Future    Standing Expiration Date:   02/02/2019    Follow up plan: Return in about 6 months (around 08/03/2018) for follow up visit.  Ria Bush, MD

## 2018-02-01 ENCOUNTER — Encounter: Payer: Self-pay | Admitting: Family Medicine

## 2018-02-01 ENCOUNTER — Ambulatory Visit (INDEPENDENT_AMBULATORY_CARE_PROVIDER_SITE_OTHER): Payer: Medicare Other

## 2018-02-01 ENCOUNTER — Ambulatory Visit (INDEPENDENT_AMBULATORY_CARE_PROVIDER_SITE_OTHER): Payer: Medicare Other | Admitting: Family Medicine

## 2018-02-01 VITALS — BP 110/60 | HR 63 | Temp 97.5°F | Ht 65.0 in | Wt 164.0 lb

## 2018-02-01 DIAGNOSIS — I10 Essential (primary) hypertension: Secondary | ICD-10-CM

## 2018-02-01 DIAGNOSIS — R7303 Prediabetes: Secondary | ICD-10-CM

## 2018-02-01 DIAGNOSIS — Z7189 Other specified counseling: Secondary | ICD-10-CM

## 2018-02-01 DIAGNOSIS — Z0000 Encounter for general adult medical examination without abnormal findings: Secondary | ICD-10-CM | POA: Diagnosis not present

## 2018-02-01 DIAGNOSIS — E785 Hyperlipidemia, unspecified: Secondary | ICD-10-CM

## 2018-02-01 DIAGNOSIS — Z1211 Encounter for screening for malignant neoplasm of colon: Secondary | ICD-10-CM

## 2018-02-01 MED ORDER — VALSARTAN-HYDROCHLOROTHIAZIDE 320-25 MG PO TABS: 1.0000 | ORAL_TABLET | Freq: Every day | ORAL | 3 refills | Status: DC

## 2018-02-01 NOTE — Assessment & Plan Note (Signed)
Chronic, improved. Continue current regimen of diovan hct with scheduled hydralazine 25mg  in am.

## 2018-02-01 NOTE — Progress Notes (Signed)
Subjective:   Kristine Owens is a 84 y.o. female who presents for Medicare Annual (Subsequent) preventive examination.  Review of Systems:  N/A Cardiac Risk Factors include: advanced age (>42men, >44 women)     Objective:     Vitals: BP 110/60 (BP Location: Right Arm, Patient Position: Sitting, Cuff Size: Normal)   Pulse 63   Temp (!) 97.5 F (36.4 C) (Oral)   Ht 5\' 5"  (1.651 m) Comment: no shoes  Wt 164 lb (74.4 kg)   SpO2 97%   BMI 27.29 kg/m   Body mass index is 27.29 kg/m.  Advanced Directives 01/23/2016  Does Patient Have a Medical Advance Directive? Yes  Type of Paramedic of [place];Living will  Copy of [place] in Chart? No - copy requested    Tobacco Social History   Tobacco Use  Smoking Status Never Smoker  Smokeless Tobacco Never Used     Counseling given: No   Clinical Intake:  Pre-visit preparation completed: Yes  Pain : No/denies pain Pain Score: 0-No pain     Nutritional Status: BMI 25 -29 Overweight Nutritional Risks: None Diabetes: No  How often do you need to have someone help you when you read instructions, pamphlets, or other written materials from your doctor or pharmacy?: 1 - Never What is the last grade level you completed in school?: Bachelor degree  Interpreter Needed?: No  Comments: pt is a widow and lives alone Information entered by :: LPinson, LPN  Past Medical History:  Diagnosis Date  . ERM OD (epiretinal membrane, right eye) 2015   also with R choroidal nevus; referred to Dr. Zigmund Daniel  . GERD (gastroesophageal reflux disease) 02/2000  . Hyperlipemia 07/2000  . Hypertension 01/2001  . Hypertensive retinopathy of both eyes    [facility]   Past Surgical History:  Procedure Laterality Date  . CATARACT EXTRACTION Bilateral 06/2016   [name]  . TONSILLECTOMY    . TOTAL ABDOMINAL HYSTERECTOMY  1981   w/BSO for fibroids (Dr. Jerene Pitch)   Family History  Problem Relation  Age of Onset  . Hypertension Mother   . Heart disease Father        MI, CVD  . Kidney disease Father        dialysis  . Hypertension Father   . Cancer Brother        lung (smoker)  . Aneurysm Brother        of aorta X 2  . Cancer Maternal Aunt        3 aunts (breast, pancreatic)   Social History   Socioeconomic History  . Marital status: Widowed    Spouse name: Not on file  . Number of children: 1  . Years of education: Not on file  . Highest education level: Not on file  Occupational History  . Occupation: Studio//Artist//Teaches    Employer: RETIRED  Social Needs  . Financial resource strain: Not on file  . Food insecurity:    Worry: Not on file    Inability: Not on file  . Transportation needs:    Medical: Not on file    Non-medical: Not on file  Tobacco Use  . Smoking status: Never Smoker  . Smokeless tobacco: Never Used  Substance and Sexual Activity  . Alcohol use: Yes    Alcohol/week: 14.0 standard drinks    Types: 14 Glasses of wine per week    Comment: red wine several times a week  . Drug use: No  .  Sexual activity: Yes  Lifestyle  . Physical activity:    Days per week: Not on file    Minutes per session: Not on file  . Stress: Not on file  Relationships  . Social connections:    Talks on phone: Not on file    Gets together: Not on file    Attends religious service: Not on file    Active member of club or organization: Not on file    Attends meetings of clubs or organizations: Not on file    Relationship status: Not on file  Other Topics Concern  . 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 atenolol (TENORMIN) 25 MG tablet TAKE 1 TABLET TWICE DAILY  . b complex vitamins tablet Take 1 tablet by mouth daily.    . cholecalciferol (VITAMIN D) 1000 UNITS tablet Take 1,000 Units by mouth daily.    Marland Kitchen Dextromethorphan-Guaifenesin (MUCINEX DM) 30-600 MG TB12 Take 1 tablet by mouth as needed.  . hydrALAZINE (APRESOLINE) 25 MG tablet Take 1 tablet (25 mg total) by mouth daily. May take second dose if needed SBP >160  . ibuprofen (ADVIL,MOTRIN) 200 MG tablet Take 200 mg by mouth every 6 (six) hours as needed.  . Multiple Vitamin (MULTIVITAMIN) tablet Take 1 tablet by mouth daily.    Marland Kitchen omeprazole (PRILOSEC OTC) 20 MG tablet Take 20 mg by mouth as needed.    . potassium chloride (K-DUR) 10 MEQ tablet Take 1 tablet (10 mEq total) by mouth every Monday, Wednesday, and Friday.  . valsartan-hydrochlorothiazide (DIOVAN-HCT) 320-25 MG tablet TAKE 1 TABLET BY MOUTH EVERY DAY   No facility-administered encounter medications on file as of 02/01/2018.     Activities of Daily Living In your present state of health, do you have any difficulty performing the following activities: 02/01/2018  Hearing? Y  Vision? N  Difficulty concentrating or making decisions? N  Walking or climbing stairs? N  Dressing or bathing? N  Doing errands, shopping? N  Preparing Food and eating ? N  Using the Toilet? N  In the past six months, have you accidently leaked urine? N  Do you have problems with loss of bowel control? N  Managing your Medications? N  Managing your Finances? N  Housekeeping or managing your Housekeeping? N  Some recent data might be hidden    Patient Care Team: Ria Bush, MD as PCP - General (Family Medicine) Druscilla Brownie, MD as Consulting Physician (Dermatology) Hayden Pedro, MD as Consulting Physician (Ophthalmology) Monna Fam, MD as Consulting Physician (Ophthalmology)    Assessment:    This is a routine wellness examination for Kristine Owens.   Hearing Screening   125Hz  250Hz  500Hz  1000Hz  2000Hz  3000Hz  4000Hz  6000Hz  8000Hz   Right ear:   40 0 40  0    Left ear:   40 40 40  0    Vision Screening Comments: Vision exam in 2019 with Dr. Herbert Deaner   Exercise Activities and Dietary recommendations Current Exercise Habits: Home exercise routine, Type of exercise: strength training/weights, Time (Minutes): 20, Frequency (  Times/Week): 7, Weekly Exercise (Minutes/Week): 140, Intensity: Moderate, Exercise limited by: None identified  Goals    . Increase physical activity     Starting 02/01/2018, I will continue to exercise at least 20 minutes daily.         Fall Risk Fall Risk  02/01/2018 01/29/2017 01/23/2016 12/26/2014 12/23/2013  Falls in the past year? 0 No Yes No No  Number falls in past yr: - - 1 - -  Injury with Fall? - - Yes - -  Risk for fall due to : - - - Impaired vision -  Follow up - - Falls evaluation completed - -   Depression Screen PHQ 2/9 Scores 02/01/2018 01/29/2017 01/23/2016 12/26/2014  PHQ - 2 Score 0 0 0 0  PHQ- 9 Score 0 - - -     Cognitive Function MMSE - Mini Mental State Exam 02/01/2018 01/23/2016  Orientation to time 5 5  Orientation to Place 5 5  Registration 3 3  Attention/ Calculation 0 0  Recall 3 3  Language- name 2 objects 0 0  Language- repeat 1 1  Language- follow 3 step command 3 3  Language- read & follow direction 0 0  Write a sentence 0 0  Copy design 0 0  Total score 20 20     PLEASE NOTE: A Mini-Cog screen was completed. Maximum score is 20. A value of 0 denotes this part of Folstein MMSE was not completed or the patient failed this part of the Mini-Cog screening.   Mini-Cog Screening Orientation to Time - Max 5 pts Orientation to Place - Max 5 pts Registration - Max 3 pts Recall - Max 3 pts Language Repeat - Max 1 pts Language Follow 3 Step Command - Max 3 pts     Immunization History  Administered Date(s)  Administered  . Influenza Whole 12/03/2006, 11/18/2007, 11/20/2008  . Influenza,inj,Quad PF,6+ Mos 12/23/2013, 10/20/2014, 02/01/2016, 01/29/2017, 11/12/2017  . Influenza-Unspecified 11/17/2012  . Pneumococcal Conjugate-13 12/23/2013  . Pneumococcal Polysaccharide-23 11/05/2001  . Td 11/06/2000, 12/23/2011  . Zoster 12/13/2009    Screening Tests Health Maintenance  Topic Date Due  . MAMMOGRAM  01/28/2018  . DTaP/Tdap/Td (1 - Tdap) 12/22/2021 (Originally 12/21/1941)  . TETANUS/TDAP  12/22/2021  . INFLUENZA VACCINE  Completed  . DEXA SCAN  Completed  . PNA vac Low Risk Adult  Completed   Plan:     I have personally reviewed, addressed, and noted the following in the patient's chart:  A. Medical and social history B. Use of alcohol, tobacco or illicit drugs  C. Current medications and supplements D. Functional ability and status E.  Nutritional status F.  Physical activity G. Advance directives H. List of other physicians I.  Hospitalizations, surgeries, and ER visits in previous 12 months J.  [place] to include hearing, vision, cognitive, depression L. Referrals and appointments - none  In addition, I have reviewed and discussed with patient certain preventive protocols, quality metrics, and best practice recommendations. A written personalized care plan for preventive services as well as general preventive health recommendations were provided to patient.  See attached scanned questionnaire for additional information.   Signed,   Lindell Noe, MHA, BS, LPN Health Coach

## 2018-02-01 NOTE — Patient Instructions (Addendum)
We will contact you about shingles shot.  Bring me copy of advanced directive  You are doing well today - continue current medicines. Return as needed or in 6 months for follow up hypertension. Pick up stool kit from the lab.   Health Maintenance After Age 82 After age 35, you are at a higher risk for certain long-term diseases and infections as well as injuries from falls. Falls are a major cause of broken bones and head injuries in people who are older than age 59. Getting regular preventive care can help to keep you healthy and well. Preventive care includes getting regular testing and making lifestyle changes as recommended by your health care provider. Talk with your health care provider about:  Which screenings and tests you should have. A screening is a test that checks for a disease when you have no symptoms.  A diet and exercise plan that is right for you. What should I know about screenings and tests to prevent falls? Screening and testing are the best ways to find a health problem early. Early diagnosis and treatment give you the best chance of managing medical conditions that are common after age 36. Certain conditions and lifestyle choices may make you more likely to have a fall. Your health care provider may recommend:  Regular vision checks. Poor vision and conditions such as cataracts can make you more likely to have a fall. If you wear glasses, make sure to get your prescription updated if your vision changes.  Medicine review. Work with your health care provider to regularly review all of the medicines you are taking, including over-the-counter medicines. Ask your health care provider about any side effects that may make you more likely to have a fall. Tell your health care provider if any medicines that you take make you feel dizzy or sleepy.  Osteoporosis screening. Osteoporosis is a condition that causes the bones to get weaker. This can make the bones weak and cause them to  break more easily.  Blood pressure screening. Blood pressure changes and medicines to control blood pressure can make you feel dizzy.  Strength and balance checks. Your health care provider may recommend certain tests to check your strength and balance while standing, walking, or changing positions.  Foot health exam. Foot pain and numbness, as well as not wearing proper footwear, can make you more likely to have a fall.  Depression screening. You may be more likely to have a fall if you have a fear of falling, feel emotionally low, or feel unable to do activities that you used to do.  Alcohol use screening. Using too much alcohol can affect your balance and may make you more likely to have a fall. What actions can I take to lower my risk of falls? General instructions  Talk with your health care provider about your risks for falling. Tell your health care provider if: ? You fall. Be sure to tell your health care provider about all falls, even ones that seem minor. ? You feel dizzy, sleepy, or off-balance.  Take over-the-counter and prescription medicines only as told by your health care provider. These include any supplements.  Eat a healthy diet and maintain a healthy weight. A healthy diet includes low-fat dairy products, low-fat (lean) meats, and fiber from whole grains, beans, and lots of fruits and vegetables. Home safety  Remove any tripping hazards, such as rugs, cords, and clutter.  Install safety equipment such as grab bars in bathrooms and safety rails on stairs.  Keep rooms and walkways well-lit. Activity   Follow a regular exercise program to stay fit. This will help you maintain your balance. Ask your health care provider what types of exercise are appropriate for you.  If you need a cane or walker, use it as recommended by your health care provider.  Wear supportive shoes that have nonskid soles. Lifestyle  Do not drink alcohol if your health care provider tells  you not to drink.  If you drink alcohol, limit how much you have: ? 0-1 drink a day for women. ? 0-2 drinks a day for men.  Be aware of how much alcohol is in your drink. In the [place] one drink equals one typical bottle of beer (12 oz), one-half glass of wine (5 oz), or one shot of hard liquor (1 oz).  Do not use any products that contain nicotine or tobacco, such as cigarettes and e-cigarettes. If you need help quitting, ask your health care provider. Summary  Having a healthy lifestyle and getting preventive care can help to protect your health and wellness after age 39.  Screening and testing are the best way to find a health problem early and help you avoid having a fall. Early diagnosis and treatment give you the best chance for managing medical conditions that are more common for people who are older than age 51.  Falls are a major cause of broken bones and head injuries in people who are older than age 2. Take precautions to prevent a fall at home.  Work with your health care provider to learn what changes you can make to improve your health and wellness and to prevent falls. This information is not intended to replace advice given to you by your health care provider. Make sure you discuss any questions you have with your health care provider. Document Released: 12/03/2016 Document Revised: 12/03/2016 Document Reviewed: 12/03/2016 Elsevier Interactive Patient Education  2019 [employer].

## 2018-02-01 NOTE — Progress Notes (Signed)
PCP notes:   Health maintenance:  No gaps identified.  Abnormal screenings:   Hearing - failed  Hearing Screening   125Hz  250Hz  500Hz  1000Hz  2000Hz  3000Hz  4000Hz  6000Hz  8000Hz   Right ear:   40 0 40  0    Left ear:   40 40 40  0     Patient concerns:   Continued concerns with sleep management.  Nurse concerns:  None  Next PCP appt:   02/01/18 @ 0830

## 2018-02-01 NOTE — Patient Instructions (Signed)
Kristine Owens , Thank you for taking time to come for your Medicare Wellness Visit. I appreciate your ongoing commitment to your health goals. Please review the following plan we discussed and let me know if I can assist you in the future.   These are the goals we discussed: Goals    . Increase physical activity     Starting 02/01/2018, I will continue to exercise at least 20 minutes daily.         This is a list of the screening recommended for you and due dates:  Health Maintenance  Topic Date Due  . Mammogram  01/28/2018  . DTaP/Tdap/Td vaccine (1 - Tdap) 12/22/2021*  . Tetanus Vaccine  12/22/2021  . Flu Shot  Completed  . DEXA scan (bone density measurement)  Completed  . Pneumonia vaccines  Completed  *Topic was postponed. The date shown is not the original due date.   Preventive Care for Adults  A healthy lifestyle and preventive care can promote health and wellness. Preventive health guidelines for adults include the following key practices.  . A routine yearly physical is a good way to check with your health care provider about your health and preventive screening. It is a chance to share any concerns and updates on your health and to receive a thorough exam.  . Visit your dentist for a routine exam and preventive care every 6 months. Brush your teeth twice a day and floss once a day. Good oral hygiene prevents tooth decay and gum disease.  . The frequency of eye exams is based on your age, health, family medical history, use  of contact lenses, and other factors. Follow your health care provider's recommendations for frequency of eye exams.  . Eat a healthy diet. Foods like vegetables, fruits, whole grains, low-fat dairy products, and lean protein foods contain the nutrients you need without too many calories. Decrease your intake of foods high in solid fats, added sugars, and salt. Eat the right amount of calories for you. Get information about a proper diet from your health  care provider, if necessary.  . Regular physical exercise is one of the most important things you can do for your health. Most adults should get at least 150 minutes of moderate-intensity exercise (any activity that increases your heart rate and causes you to sweat) each week. In addition, most adults need muscle-strengthening exercises on 2 or more days a week.  Silver Sneakers may be a benefit available to you. To determine eligibility, you may visit the website: www.silversneakers.com or contact program at (574)830-9618 Mon-Fri between 8AM-8PM.   . Maintain a healthy weight. The body mass index (BMI) is a screening tool to identify possible weight problems. It provides an estimate of body fat based on height and weight. Your health care provider can find your BMI and can help you achieve or maintain a healthy weight.   For adults 20 years and older: ? A BMI below 18.5 is considered underweight. ? A BMI of 18.5 to 24.9 is normal. ? A BMI of 25 to 29.9 is considered overweight. ? A BMI of 30 and above is considered obese.   . Maintain normal blood lipids and cholesterol levels by exercising and minimizing your intake of saturated fat. Eat a balanced diet with plenty of fruit and vegetables. Blood tests for lipids and cholesterol should begin at age 24 and be repeated every 5 years. If your lipid or cholesterol levels are high, you are over 50, or you are  at high risk for heart disease, you may need your cholesterol levels checked more frequently. Ongoing high lipid and cholesterol levels should be treated with medicines if diet and exercise are not working.  . If you smoke, find out from your health care provider how to quit. If you do not use tobacco, please do not start.  . If you choose to drink alcohol, please do not consume more than 2 drinks per day. One drink is considered to be 12 ounces (355 mL) of beer, 5 ounces (148 mL) of wine, or 1.5 ounces (44 mL) of liquor.  . If you are 62-69  years old, ask your health care provider if you should take aspirin to prevent strokes.  . Use sunscreen. Apply sunscreen liberally and repeatedly throughout the day. You should seek shade when your shadow is shorter than you. Protect yourself by wearing long sleeves, pants, a wide-brimmed hat, and sunglasses year round, whenever you are outdoors.  . Once a month, do a whole body skin exam, using a mirror to look at the skin on your back. Tell your health care provider of new moles, moles that have irregular borders, moles that are larger than a pencil eraser, or moles that have changed in shape or color.

## 2018-02-01 NOTE — Assessment & Plan Note (Signed)
Chronic, stable off meds. Continue to monitor. The ASCVD Risk score Kristine Bussing DC Jr., Kristine al., 2013) failed to calculate for the following reasons:   The 2013 ASCVD risk score is only valid for ages 50 to 57

## 2018-02-01 NOTE — Assessment & Plan Note (Signed)
Advanced directives: thinks wants daughter be HCPOA. Has this at home and will bring [place] a copy.

## 2018-02-01 NOTE — Assessment & Plan Note (Signed)
Fasting impaired sugars but A1c actually normal range. Will continue to watch.

## 2018-02-04 ENCOUNTER — Ambulatory Visit: Payer: Medicare Other

## 2018-02-05 ENCOUNTER — Ambulatory Visit
Admission: RE | Admit: 2018-02-05 | Discharge: 2018-02-05 | Disposition: A | Discharge status: Home / Self Care | Payer: Medicare Other | Source: Ambulatory Visit | Attending: Family Medicine | Admitting: Family Medicine

## 2018-02-05 DIAGNOSIS — Z1231 Encounter for screening mammogram for malignant neoplasm of breast: Secondary | ICD-10-CM | POA: Diagnosis not present

## 2018-02-08 ENCOUNTER — Other Ambulatory Visit: Payer: Self-pay | Admitting: Family Medicine

## 2018-02-08 DIAGNOSIS — R928 Other abnormal and inconclusive findings on diagnostic imaging of breast: Secondary | ICD-10-CM

## 2018-02-10 ENCOUNTER — Ambulatory Visit
Admission: RE | Admit: 2018-02-10 | Discharge: 2018-02-10 | Disposition: A | Discharge status: Home / Self Care | Payer: Medicare Other | Source: Ambulatory Visit | Attending: Family Medicine | Admitting: Family Medicine

## 2018-02-10 ENCOUNTER — Encounter: Payer: Self-pay | Admitting: Family Medicine

## 2018-02-10 DIAGNOSIS — R922 Inconclusive mammogram: Secondary | ICD-10-CM | POA: Diagnosis not present

## 2018-02-10 DIAGNOSIS — R928 Other abnormal and inconclusive findings on diagnostic imaging of breast: Secondary | ICD-10-CM

## 2018-02-10 DIAGNOSIS — N6489 Other specified disorders of breast: Secondary | ICD-10-CM | POA: Diagnosis not present

## 2018-02-10 LAB — HM MAMMOGRAPHY

## 2018-02-11 ENCOUNTER — Other Ambulatory Visit (INDEPENDENT_AMBULATORY_CARE_PROVIDER_SITE_OTHER): Payer: Medicare Other

## 2018-02-11 DIAGNOSIS — Z1211 Encounter for screening for malignant neoplasm of colon: Secondary | ICD-10-CM | POA: Diagnosis not present

## 2018-02-11 LAB — FECAL OCCULT BLOOD, GUAIAC: Fecal Occult Blood: NEGATIVE

## 2018-02-11 LAB — FECAL OCCULT BLOOD, IMMUNOCHEMICAL: Fecal Occult Bld: NEGATIVE

## 2018-02-15 ENCOUNTER — Encounter: Payer: Self-pay | Admitting: Family Medicine

## 2018-02-23 DIAGNOSIS — Z0000 Encounter for general adult medical examination without abnormal findings: Secondary | ICD-10-CM | POA: Diagnosis not present

## 2018-03-10 DIAGNOSIS — H35373 Puckering of macula, bilateral: Secondary | ICD-10-CM | POA: Diagnosis not present

## 2018-03-10 DIAGNOSIS — H3554 Dystrophies primarily involving the retinal pigment epithelium: Secondary | ICD-10-CM | POA: Diagnosis not present

## 2018-03-10 DIAGNOSIS — H40023 Open angle with borderline findings, high risk, bilateral: Secondary | ICD-10-CM | POA: Diagnosis not present

## 2018-03-10 DIAGNOSIS — H35033 Hypertensive retinopathy, bilateral: Secondary | ICD-10-CM | POA: Diagnosis not present

## 2018-03-23 ENCOUNTER — Other Ambulatory Visit: Payer: Self-pay | Admitting: Family Medicine

## 2018-03-24 NOTE — Progress Notes (Signed)
I reviewed health advisor's note, was available for consultation, and agree with documentation and plan.  

## 2018-03-30 ENCOUNTER — Other Ambulatory Visit: Payer: Self-pay | Admitting: Family Medicine

## 2018-04-05 ENCOUNTER — Encounter (INDEPENDENT_AMBULATORY_CARE_PROVIDER_SITE_OTHER): Payer: Medicare Other | Admitting: Ophthalmology

## 2018-04-07 ENCOUNTER — Encounter (INDEPENDENT_AMBULATORY_CARE_PROVIDER_SITE_OTHER): Payer: Medicare Other | Admitting: Ophthalmology

## 2018-04-07 DIAGNOSIS — D3132 Benign neoplasm of left choroid: Secondary | ICD-10-CM | POA: Diagnosis not present

## 2018-04-07 DIAGNOSIS — H353132 Nonexudative age-related macular degeneration, bilateral, intermediate dry stage: Secondary | ICD-10-CM | POA: Diagnosis not present

## 2018-04-07 DIAGNOSIS — I10 Essential (primary) hypertension: Secondary | ICD-10-CM | POA: Diagnosis not present

## 2018-04-07 DIAGNOSIS — H43813 Vitreous degeneration, bilateral: Secondary | ICD-10-CM

## 2018-04-07 DIAGNOSIS — H35372 Puckering of macula, left eye: Secondary | ICD-10-CM | POA: Diagnosis not present

## 2018-04-07 DIAGNOSIS — H35033 Hypertensive retinopathy, bilateral: Secondary | ICD-10-CM | POA: Diagnosis not present

## 2018-06-22 ENCOUNTER — Other Ambulatory Visit: Payer: Self-pay | Admitting: Family Medicine

## 2018-08-03 ENCOUNTER — Telehealth: Payer: Self-pay | Admitting: Family Medicine

## 2018-08-03 NOTE — Telephone Encounter (Signed)
Left message asking pt to call office regarding 7/1 appointment  Please offer virtual appointment If pt is ok with this document in appointment notes which way   TEXT  Get phone number  [facility] computer or [facility]top  Get email addres  IN OFFICE if pt prefers  Do covid screening

## 2018-08-04 ENCOUNTER — Ambulatory Visit (INDEPENDENT_AMBULATORY_CARE_PROVIDER_SITE_OTHER): Payer: Medicare Other | Admitting: Family Medicine

## 2018-08-04 ENCOUNTER — Encounter: Payer: Self-pay | Admitting: Family Medicine

## 2018-08-04 ENCOUNTER — Other Ambulatory Visit: Payer: Self-pay | Admitting: Family Medicine

## 2018-08-04 VITALS — BP 165/65 | HR 81 | Ht 65.0 in | Wt 160.0 lb

## 2018-08-04 DIAGNOSIS — I10 Essential (primary) hypertension: Secondary | ICD-10-CM | POA: Diagnosis not present

## 2018-08-04 MED ORDER — HYDRALAZINE HCL 25 MG PO TABS: 25.0000 mg | ORAL_TABLET | Freq: Two times a day (BID) | ORAL | 1 refills | Status: DC

## 2018-08-04 NOTE — Assessment & Plan Note (Signed)
Chronic, deteriorated.  Will increase hydralazine to 25mg  bid.  Continue diovan hct and atenolol as previously.  Pt will continue to monitor at home, let me know if consistently elevated.

## 2018-08-04 NOTE — Progress Notes (Signed)
Virtual visit completed through [employer]. Due to national recommendations of social distancing due to COVID-19, a virtual visit is felt to be most appropriate for this patient at this time. Reviewed limitations of a virtual visit.   Patient location: home Provider location: [place], office If any vitals were documented, they were collected by patient at home unless specified below.    BP (!) 165/65 (BP Location: Right Arm, Cuff Size: Normal)   Pulse 81   Ht 5\' 5"  (1.651 m)   Wt 160 lb (72.6 kg)   BMI 26.63 kg/m   BP Readings from Last 3 Encounters:  08/04/18 (!) 165/65  02/01/18 110/60  02/01/18 110/60    CC: 6 mo f/u visit Subjective:    Patient ID: Kristine Owens, female    DOB: 02-Jul-1930, 85 y.o.   MRN: [id number]  HPI: Kristine Owens is a 85 y.o. female presenting on 08/04/2018 for Follow-up (6 mo f/u.)   HTN - Compliant with current antihypertensive regimen of diovan hct 320/25mg  and hydralazine 25mg  daily in the mornings as well as atenolol 25mg  bid. Does check blood pressures at home: high 170 in the mornings, 140 in the evenings. Denies increased sodium intake. No low blood pressure readings or symptoms of dizziness/syncope. Denies HA, vision changes, CP/tightness, SOB, leg swelling.      Relevant past medical, surgical, family and social history reviewed and updated as indicated. Interim medical history since our last visit reviewed. Allergies and medications reviewed and updated. Outpatient Medications Prior to Visit  Medication Sig Dispense Refill  . atenolol (TENORMIN) 25 MG tablet TAKE 1 TABLET TWICE DAILY 180 tablet 3  . b complex vitamins tablet Take 1 tablet by mouth daily.      . cholecalciferol (VITAMIN D) 1000 UNITS tablet Take 1,000 Units by mouth daily.      Marland Kitchen Dextromethorphan-Guaifenesin (MUCINEX DM) 30-600 MG TB12 Take 1 tablet by mouth as needed.    Marland Kitchen ibuprofen (ADVIL,MOTRIN) 200 MG tablet Take 200 mg by mouth every 6 (six)  hours as needed.    . Multiple Vitamin (MULTIVITAMIN) tablet Take 1 tablet by mouth daily.      Marland Kitchen omeprazole (PRILOSEC OTC) 20 MG tablet Take 20 mg by mouth as needed.      . potassium chloride (K-DUR) 10 MEQ tablet Take 1 tablet (10 mEq total) by mouth every Monday, Wednesday, and Friday. 30 tablet 11  . valsartan-hydrochlorothiazide (DIOVAN-HCT) 320-25 MG tablet Take 1 tablet by mouth daily. 90 tablet 3  . hydrALAZINE (APRESOLINE) 25 MG tablet TAKE 1 TABLET DAILY. MAY TAKE SECOND DOSE IF NEEDED SYSTOLIC BLOOD PRESSURE GREATER THAN 160 120 tablet 0   No facility-administered medications prior to visit.      Per HPI unless specifically indicated in ROS section below Review of Systems Objective:    BP (!) 165/65 (BP Location: Right Arm, Cuff Size: Normal)   Pulse 81   Ht 5\' 5"  (1.651 m)   Wt 160 lb (72.6 kg)   BMI 26.63 kg/m   Wt Readings from Last 3 Encounters:  08/04/18 160 lb (72.6 kg)  02/01/18 164 lb (74.4 kg)  02/01/18 164 lb (74.4 kg)     Physical exam: Gen: alert, NAD, not ill appearing Pulm: speaks in complete sentences without increased work of breathing Psych: normal mood, normal thought content      Results for orders placed or performed in visit on 02/15/18  Fecal Occult Blood, Guaiac  Result Value Ref Range  Fecal Occult Blood Negative    Lab Results  Component Value Date   CREATININE 0.89 01/25/2018   BUN 14 01/25/2018   NA 135 01/25/2018   K 3.5 01/25/2018   CL 97 01/25/2018   CO2 30 01/25/2018    Lab Results  Component Value Date   HGBA1C 5.5 01/25/2018    Assessment & Plan:   Problem List Items Addressed This Visit    Essential hypertension - Primary    Chronic, deteriorated.  Will increase hydralazine to 25mg  bid.  Continue diovan hct and atenolol as previously.  Pt will continue to monitor at home, let me know if consistently elevated.       Relevant Medications   hydrALAZINE (APRESOLINE) 25 MG tablet       Meds ordered this  encounter  Medications  . hydrALAZINE (APRESOLINE) 25 MG tablet    Sig: Take 1 tablet (25 mg total) by mouth 2 (two) times a day.    Dispense:  180 tablet    Refill:  1   No orders of the defined types were placed in this encounter.   I discussed the assessment and treatment plan with the patient. The patient was provided an opportunity to ask questions and all were answered. The patient agreed with the plan and demonstrated an understanding of the instructions. The patient was advised to call back or seek an in-person evaluation if the symptoms worsen or if the condition fails to improve as anticipated.  Follow up plan: No follow-ups on file.  Ria Bush, MD

## 2018-09-09 DIAGNOSIS — H1045 Other chronic allergic conjunctivitis: Secondary | ICD-10-CM | POA: Diagnosis not present

## 2018-09-09 DIAGNOSIS — H0102A Squamous blepharitis right eye, upper and lower eyelids: Secondary | ICD-10-CM | POA: Diagnosis not present

## 2018-09-09 DIAGNOSIS — H40013 Open angle with borderline findings, low risk, bilateral: Secondary | ICD-10-CM | POA: Diagnosis not present

## 2018-09-09 DIAGNOSIS — H04123 Dry eye syndrome of bilateral lacrimal glands: Secondary | ICD-10-CM | POA: Diagnosis not present

## 2018-10-12 DIAGNOSIS — D1801 Hemangioma of skin and subcutaneous tissue: Secondary | ICD-10-CM | POA: Diagnosis not present

## 2018-10-12 DIAGNOSIS — D225 Melanocytic nevi of trunk: Secondary | ICD-10-CM | POA: Diagnosis not present

## 2018-10-12 DIAGNOSIS — L821 Other seborrheic keratosis: Secondary | ICD-10-CM | POA: Diagnosis not present

## 2018-10-12 DIAGNOSIS — Z872 Personal history of diseases of the skin and subcutaneous tissue: Secondary | ICD-10-CM | POA: Diagnosis not present

## 2018-10-12 DIAGNOSIS — L570 Actinic keratosis: Secondary | ICD-10-CM | POA: Diagnosis not present

## 2018-12-16 DIAGNOSIS — H0102A Squamous blepharitis right eye, upper and lower eyelids: Secondary | ICD-10-CM | POA: Diagnosis not present

## 2018-12-16 DIAGNOSIS — H1045 Other chronic allergic conjunctivitis: Secondary | ICD-10-CM | POA: Diagnosis not present

## 2018-12-16 DIAGNOSIS — H40013 Open angle with borderline findings, low risk, bilateral: Secondary | ICD-10-CM | POA: Diagnosis not present

## 2018-12-16 DIAGNOSIS — H04123 Dry eye syndrome of bilateral lacrimal glands: Secondary | ICD-10-CM | POA: Diagnosis not present

## 2018-12-17 ENCOUNTER — Other Ambulatory Visit: Payer: Self-pay | Admitting: Family Medicine

## 2019-01-07 ENCOUNTER — Other Ambulatory Visit: Payer: Self-pay | Admitting: Family Medicine

## 2019-01-07 DIAGNOSIS — Z1231 Encounter for screening mammogram for malignant neoplasm of breast: Secondary | ICD-10-CM

## 2019-01-08 ENCOUNTER — Other Ambulatory Visit: Payer: Self-pay | Admitting: Family Medicine

## 2019-02-01 ENCOUNTER — Other Ambulatory Visit: Payer: Self-pay | Admitting: Family Medicine

## 2019-02-01 DIAGNOSIS — E785 Hyperlipidemia, unspecified: Secondary | ICD-10-CM

## 2019-02-01 DIAGNOSIS — R7303 Prediabetes: Secondary | ICD-10-CM

## 2019-02-02 ENCOUNTER — Other Ambulatory Visit: Payer: Self-pay

## 2019-02-02 ENCOUNTER — Other Ambulatory Visit (INDEPENDENT_AMBULATORY_CARE_PROVIDER_SITE_OTHER): Payer: Medicare Other

## 2019-02-02 DIAGNOSIS — E785 Hyperlipidemia, unspecified: Secondary | ICD-10-CM

## 2019-02-02 DIAGNOSIS — R7303 Prediabetes: Secondary | ICD-10-CM | POA: Diagnosis not present

## 2019-02-02 LAB — LIPID PANEL
Cholesterol: 217 mg/dL — ABNORMAL HIGH (ref 0–200)
HDL: 77.9 mg/dL (ref >39.00)
LDL Cholesterol: 120 mg/dL — ABNORMAL HIGH (ref 0–99)
NonHDL: 138.69
Total CHOL/HDL Ratio: 3
Triglycerides: 94 mg/dL (ref 0.0–149.0)
VLDL: 18.8 mg/dL (ref 0.0–40.0)

## 2019-02-02 LAB — COMPREHENSIVE METABOLIC PANEL
ALT: 28 U/L (ref 0–35)
AST: 26 U/L (ref 0–37)
Albumin: 4.2 g/dL (ref 3.5–5.2)
Alkaline Phosphatase: 61 U/L (ref 39–117)
BUN: 17 mg/dL (ref 6–23)
CO2: 31 mEq/L (ref 19–32)
Calcium: 9.5 mg/dL (ref 8.4–10.5)
Chloride: 98 mEq/L (ref 96–112)
Creatinine, Ser: 0.81 mg/dL (ref 0.40–1.20)
GFR: 67.02 mL/min (ref >60.00)
Glucose, Bld: 105 mg/dL — ABNORMAL HIGH (ref 70–99)
Potassium: 3.5 mEq/L (ref 3.5–5.1)
Sodium: 136 mEq/L (ref 135–145)
Total Bilirubin: 0.5 mg/dL (ref 0.2–1.2)
Total Protein: 6.6 g/dL (ref 6.0–8.3)

## 2019-02-02 LAB — HEMOGLOBIN A1C: Hgb A1c MFr Bld: 5.4 % (ref 4.6–6.5)

## 2019-02-09 ENCOUNTER — Ambulatory Visit: Payer: Medicare Other

## 2019-02-09 ENCOUNTER — Encounter: Payer: Self-pay | Admitting: Family Medicine

## 2019-02-09 ENCOUNTER — Ambulatory Visit (INDEPENDENT_AMBULATORY_CARE_PROVIDER_SITE_OTHER): Payer: Medicare Other

## 2019-02-09 ENCOUNTER — Ambulatory Visit (INDEPENDENT_AMBULATORY_CARE_PROVIDER_SITE_OTHER): Payer: Medicare Other | Admitting: Family Medicine

## 2019-02-09 ENCOUNTER — Other Ambulatory Visit: Payer: Self-pay

## 2019-02-09 VITALS — BP 130/68 | HR 60 | Temp 97.9°F | Resp 16 | Ht 64.75 in | Wt 165.4 lb

## 2019-02-09 VITALS — BP 150/80 | Wt 165.0 lb

## 2019-02-09 DIAGNOSIS — E785 Hyperlipidemia, unspecified: Secondary | ICD-10-CM

## 2019-02-09 DIAGNOSIS — Z7189 Other specified counseling: Secondary | ICD-10-CM | POA: Diagnosis not present

## 2019-02-09 DIAGNOSIS — Z0000 Encounter for general adult medical examination without abnormal findings: Secondary | ICD-10-CM | POA: Diagnosis not present

## 2019-02-09 DIAGNOSIS — I10 Essential (primary) hypertension: Secondary | ICD-10-CM | POA: Diagnosis not present

## 2019-02-09 DIAGNOSIS — K219 Gastro-esophageal reflux disease without esophagitis: Secondary | ICD-10-CM

## 2019-02-09 DIAGNOSIS — R7303 Prediabetes: Secondary | ICD-10-CM

## 2019-02-09 DIAGNOSIS — Z1211 Encounter for screening for malignant neoplasm of colon: Secondary | ICD-10-CM

## 2019-02-09 MED ORDER — HYDRALAZINE HCL 25 MG PO TABS: 25.0000 mg | ORAL_TABLET | Freq: Two times a day (BID) | ORAL | 3 refills | Status: DC

## 2019-02-09 NOTE — Assessment & Plan Note (Signed)
Advanced directives: brings, scanned. Wants daughter Kristine Owens to be HCPOA. [place] for temporary life support/ CPR/ intubation but does not want prolonged life support if terminal condition. Ada with artifical hydration.

## 2019-02-09 NOTE — Patient Instructions (Addendum)
If interested, check with pharmacy about new 2 shot shingles series (shingrix).  You are doing well today. Return as needed or in 1 year for next wellness visit.  Keep track of blood pressures, let us know if consistently >150/90.  Health Maintenance After Age 84 After age 55, you are at a higher risk for certain long-term diseases and infections as well as injuries from falls. Falls are a major cause of broken bones and head injuries in people who are older than age 74. Getting regular preventive care can help to keep you healthy and well. Preventive care includes getting regular testing and making lifestyle changes as recommended by your health care provider. Talk with your health care provider about:  Which screenings and tests you should have. A screening is a test that checks for a disease when you have no symptoms.  A diet and exercise plan that is right for you. What should I know about screenings and tests to prevent falls? Screening and testing are the best ways to find a health problem early. Early diagnosis and treatment give you the best chance of managing medical conditions that are common after age 73. Certain conditions and lifestyle choices may make you more likely to have a fall. Your health care provider may recommend:  Regular vision checks. Poor vision and conditions such as cataracts can make you more likely to have a fall. If you wear glasses, make sure to get your prescription updated if your vision changes.  Medicine review. Work with your health care provider to regularly review all of the medicines you are taking, including over-the-counter medicines. Ask your health care provider about any side effects that may make you more likely to have a fall. Tell your health care provider if any medicines that you take make you feel dizzy or sleepy.  Osteoporosis screening. Osteoporosis is a condition that causes the bones to get weaker. This can make the bones weak and cause them  to break more easily.  Blood pressure screening. Blood pressure changes and medicines to control blood pressure can make you feel dizzy.  Strength and balance checks. Your health care provider may recommend certain tests to check your strength and balance while standing, walking, or changing positions.  Foot health exam. Foot pain and numbness, as well as not wearing proper footwear, can make you more likely to have a fall.  Depression screening. You may be more likely to have a fall if you have a fear of falling, feel emotionally low, or feel unable to do activities that you used to do.  Alcohol use screening. Using too much alcohol can affect your balance and may make you more likely to have a fall. What actions can I take to lower my risk of falls? General instructions  Talk with your health care provider about your risks for falling. Tell your health care provider if: ? You fall. Be sure to tell your health care provider about all falls, even ones that seem minor. ? You feel dizzy, sleepy, or off-balance.  Take over-the-counter and prescription medicines only as told by your health care provider. These include any supplements.  Eat a healthy diet and maintain a healthy weight. A healthy diet includes low-fat dairy products, low-fat (lean) meats, and fiber from whole grains, beans, and lots of fruits and vegetables. Home safety  Remove any tripping hazards, such as rugs, cords, and clutter.  Install safety equipment such as grab bars in bathrooms and safety rails on stairs.  Keep  rooms and walkways well-lit. Activity   Follow a regular exercise program to stay fit. This will help you maintain your balance. Ask your health care provider what types of exercise are appropriate for you.  If you need a cane or walker, use it as recommended by your health care provider.  Wear supportive shoes that have nonskid soles. Lifestyle  Do not drink alcohol if your health care provider  tells you not to drink.  If you drink alcohol, limit how much you have: ? 0-1 drink a day for women. ? 0-2 drinks a day for men.  Be aware of how much alcohol is in your drink. In the [place] one drink equals one typical bottle of beer (12 oz), one-half glass of wine (5 oz), or one shot of hard liquor (1 oz).  Do not use any products that contain nicotine or tobacco, such as cigarettes and e-cigarettes. If you need help quitting, ask your health care provider. Summary  Having a healthy lifestyle and getting preventive care can help to protect your health and wellness after age 58.  Screening and testing are the best way to find a health problem early and help you avoid having a fall. Early diagnosis and treatment give you the best chance for managing medical conditions that are more common for people who are older than age 87.  Falls are a major cause of broken bones and head injuries in people who are older than age 86. Take precautions to prevent a fall at home.  Work with your health care provider to learn what changes you can make to improve your health and wellness and to prevent falls. This information is not intended to replace advice given to you by your health care provider. Make sure you discuss any questions you have with your health care provider. Document Revised: 05/13/2018 Document Reviewed: 12/03/2016 Elsevier Patient Education  2020 [employer].

## 2019-02-09 NOTE — Assessment & Plan Note (Signed)
Latest A1c normal.

## 2019-02-09 NOTE — Assessment & Plan Note (Signed)
Chronic, stable on current regimen - continue monitoring at home.

## 2019-02-09 NOTE — Progress Notes (Signed)
Subjective:   Kristine Owens is a 86 y.o. female who presents for Medicare Annual (Subsequent) preventive examination.  Review of Systems: N/A   This visit is being conducted through telemedicine via telephone at the nurse health advisor's home address due to the COVID-19 pandemic. This patient has given me verbal consent via doximity to conduct this visit, patient states they are participating from their home address. Patient and myself are on the telephone call. There is no referral for this visit. Some vital signs may be absent or patient reported.    Patient identification: identified by name, DOB, and current address   Cardiac Risk Factors include: advanced age (>68men, >67 women);hypertension;dyslipidemia     Objective:     Vitals: BP (!) 150/80   Wt 165 lb (74.8 kg)   BMI 27.46 kg/m   Body mass index is 27.46 kg/m.  Advanced Directives 02/09/2019 02/01/2018 01/23/2016  Does Patient Have a Medical Advance Directive? Yes Yes Yes  Type of Paramedic of [place];Living will [place];Living will [place];Living will  Copy of [place] in Chart? No - copy requested No - copy requested No - copy requested    Tobacco Social History   Tobacco Use  Smoking Status Never Smoker  Smokeless Tobacco Never Used     Counseling given: Not Answered   Clinical Intake:  Pre-visit preparation completed: Yes  Pain : No/denies pain     Nutritional Risks: None Diabetes: No  How often do you need to have someone help you when you read instructions, pamphlets, or other written materials from your doctor or pharmacy?: 1 - Never What is the last grade level you completed in school?: undergraduate degree  Interpreter Needed?: No  Information entered by :: CJohnson, LPN  Past Medical History:  Diagnosis Date  . ERM OD (epiretinal membrane, right eye) 2015   also with R choroidal nevus;  referred to Dr. Zigmund Daniel  . GERD (gastroesophageal reflux disease) 02/2000  . Hyperlipemia 07/2000  . Hypertension 01/2001  . Hypertensive retinopathy of both eyes    [facility]   Past Surgical History:  Procedure Laterality Date  . CATARACT EXTRACTION Bilateral 06/2016   [name]  . TONSILLECTOMY    . TOTAL ABDOMINAL HYSTERECTOMY  1981   w/BSO for fibroids (Dr. Jerene Pitch)   Family History  Problem Relation Age of Onset  . Hypertension Mother   . Heart disease Father        MI, CVD  . Kidney disease Father        dialysis  . Hypertension Father   . Cancer Brother        lung (smoker)  . Aneurysm Brother        of aorta X 2  . Cancer Maternal Aunt        3 aunts (breast, pancreatic)  . Breast cancer Maternal Aunt    Social History   Socioeconomic History  . Marital status: Widowed    Spouse name: Not on file  . Number of children: 1  . Years of education: Not on file  . Highest education level: Not on file  Occupational History  . Occupation: Studio//Artist//Teaches    Employer: RETIRED  Tobacco Use  . Smoking status: Never Smoker  . Smokeless tobacco: Never Used  Substance and Sexual Activity  . Alcohol use: Yes    Alcohol/week: 14.0 standard drinks    Types: 14 Glasses of wine per week    Comment:  red wine several times a week  . Drug use: No  . Sexual activity: Yes  Other Topics Concern  . 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 Difficulty of Paying Living Expenses: Not hard at all  Food Insecurity: No Food Insecurity  . Worried  About Charity fundraiser in the Last Year: Never true  . Ran Out of Food in the Last Year: Never true  Transportation Needs: No Transportation Needs  . Lack of Transportation (Medical): No  . Lack of Transportation (Non-Medical): No  Physical Activity: Inactive  . Days of Exercise per Week: 0 days  . Minutes of Exercise per Session: 0 min  Stress: No Stress Concern Present  . Feeling of Stress : Not at all  Social Connections:   . Frequency of Communication with Friends and Family: Not on file  . Frequency of Social Gatherings with Friends and Family: Not on file  . Attends Religious Services: Not on file  . Active Member of Clubs or Organizations: Not on file  . Attends Archivist Meetings: Not on file  . Marital Status: Not on file    Outpatient Encounter Medications as of 02/09/2019  Medication Sig  . atenolol (TENORMIN) 25 MG tablet TAKE 1 TABLET TWICE DAILY  . b complex vitamins tablet Take 1 tablet by mouth daily.    . cholecalciferol (VITAMIN D) 1000 UNITS tablet Take 1,000 Units by mouth daily.    Marland Kitchen Dextromethorphan-Guaifenesin (MUCINEX DM) 30-600 MG TB12 Take 1 tablet by mouth as needed.  . hydrALAZINE (APRESOLINE) 25 MG tablet TAKE 1 TABLET TWICE DAILY  . ibuprofen (ADVIL,MOTRIN) 200 MG tablet Take 200 mg by mouth every 6 (six) hours as needed.  . Multiple Vitamin (MULTIVITAMIN) tablet Take 1 tablet by mouth daily.    Marland Kitchen omeprazole (PRILOSEC OTC) 20 MG tablet Take 20 mg by mouth as needed.    . potassium chloride (K-DUR) 10 MEQ tablet Take 1 tablet (10 mEq total) by mouth every Monday, Wednesday, and Friday.  . valsartan-hydrochlorothiazide (DIOVAN-HCT) 320-25 MG tablet TAKE 1 TABLET BY MOUTH EVERY DAY   No facility-administered encounter medications on file as of 02/09/2019.    Activities of Daily Living In your present state of health, do you have any difficulty performing the following activities: 02/09/2019  Hearing? Y  Comment some hearing loss  Vision? N    Difficulty concentrating or making decisions? N  Walking or climbing stairs? N  Dressing or bathing? N  Doing errands, shopping? N  Preparing Food and eating ? N  Using the Toilet? N  Managing your Medications? N  Managing your Finances? N  Housekeeping or managing your Housekeeping? N  Some recent data might be hidden    Patient Care Team: Ria Bush, MD as PCP - General (Family Medicine) Druscilla Brownie, MD as Consulting Physician (Dermatology) Hayden Pedro, MD as Consulting Physician (Ophthalmology) Monna Fam, MD as Consulting Physician (Ophthalmology)    Assessment:   This is a routine wellness examination for Sherelle.  Exercise Activities and  Dietary recommendations Current Exercise Habits: The patient does not participate in regular exercise at present, Exercise limited by: None identified  Goals    . Increase physical activity     Starting 02/01/2018, I will continue to exercise at least 20 minutes daily.      . Patient Stated     02/09/2019, I will try to exercise more on a daily basis.        Fall Risk Fall Risk  02/09/2019 02/01/2018 01/29/2017 01/23/2016 12/26/2014  Falls in the past year? 0 0 No Yes No  Number falls in past yr: 0 - - 1 -  Injury with Fall? 0 - - Yes -  Risk for fall due to : Medication side effect - - - Impaired vision  Follow up Falls evaluation completed;Falls prevention discussed - - Falls evaluation completed -   Is the patient's home free of loose throw rugs in walkways, pet beds, electrical cords, etc?   yes      Grab bars in the bathroom? yes      Handrails on the stairs?   yes      Adequate lighting?   yes  Timed Get Up and Go performed: N/A  Depression Screen PHQ 2/9 Scores 02/09/2019 02/01/2018 01/29/2017 01/23/2016  PHQ - 2 Score 0 0 0 0  PHQ- 9 Score 0 0 - -     Cognitive Function MMSE - Mini Mental State Exam 02/09/2019 02/01/2018 01/23/2016  Orientation to time 5 5 5   Orientation to Place 5 5 5    Registration 3 3 3   Attention/ Calculation 5 0 0  Recall 3 3 3   Language- name 2 objects - 0 0  Language- repeat 1 1 1   Language- follow 3 step command - 3 3  Language- read & follow direction - 0 0  Write a sentence - 0 0  Copy design - 0 0  Total score - 20 20  Mini Cog  Mini-Cog screen was completed. Maximum score is 22. A value of 0 denotes this part of the MMSE was not completed or the patient failed this part of the Mini-Cog screening.       Immunization History  Administered Date(s) Administered  . Influenza Whole 12/03/2006, 11/18/2007, 11/20/2008  . Influenza,inj,Quad PF,6+ Mos 12/23/2013, 10/20/2014, 02/01/2016, 01/29/2017, 11/12/2017, 11/09/2018  . Influenza-Unspecified 11/17/2012  . Pneumococcal Conjugate-13 12/23/2013  . Pneumococcal Polysaccharide-23 11/05/2001  . Td 11/06/2000, 12/23/2011  . Zoster 12/13/2009    Qualifies for Shingles Vaccine? yes  Screening Tests Health Maintenance  Topic Date Due  . DTAP VACCINES (1) 02/19/1931  . MAMMOGRAM  02/11/2019  . DTaP/Tdap/Td (3 - Tdap) 12/22/2021  . TETANUS/TDAP  12/22/2021  . INFLUENZA VACCINE  Completed  . DEXA SCAN  Completed  . PNA vac Low Risk Adult  Completed    Cancer Screenings: Lung: Low Dose CT Chest recommended if Age 42-80 years, 30 pack-year currently smoking OR have quit w/in 15years. Patient does not qualify. Breast:  Up to date on Mammogram? Yes, scheduled for 03/01/2019   Up to date of Bone Density/Dexa? Yes,completed 01/03/2009 Colorectal: no longer required  Additional Screenings:  Hepatitis C Screening: N/A     Plan:    Patient will try to exercise more daily.    I have personally reviewed and noted the following in the patient's chart:   . Medical and social history . Use of alcohol, tobacco or illicit drugs  . Current medications and supplements . Functional ability and status . Nutritional status .  Physical activity . Advanced directives . List of other  physicians . Hospitalizations, surgeries, and ER visits in previous 12 months . Vitals . Screenings to include cognitive, depression, and falls . Referrals and appointments  In addition, I have reviewed and discussed with patient certain preventive protocols, quality metrics, and best practice recommendations. A written personalized care plan for preventive services as well as general preventive health recommendations were provided to patient.     Andrez Grime, LPN  [id number]

## 2019-02-09 NOTE — Progress Notes (Signed)
PCP notes:  Health Maintenance: No gaps noted   Abnormal Screenings: none   Patient concerns: none   Nurse concerns: none   Next PCP appt.: 02/09/2019 @ 10:30 am

## 2019-02-09 NOTE — Progress Notes (Signed)
This visit was conducted in person.  BP 130/68 (BP Location: Left Arm, Patient Position: Sitting, Cuff Size: Normal)   Pulse 60   Temp 97.9 F (36.6 C) (Tympanic)   Resp 16   Ht 5' 4.75" (1.645 m)   Wt 165 lb 6.4 oz (75 kg)   SpO2 100%   BMI 27.74 kg/m   CC: AMW f/u visit Subjective:    Patient ID: Kristine Owens, female    DOB: 04-Oct-1930, 86 y.o.   MRN: [id number]  HPI: Kristine Owens is a 86 y.o. female presenting on 02/09/2019 for Annual Exam   Saw health advisor today for medicare wellness visit. Note reviewed.    Hearing Screening   125Hz  250Hz  500Hz  1000Hz  2000Hz  3000Hz  4000Hz  6000Hz  8000Hz   Right ear:   40 40 0  0    Left ear:   40 0 40  0        Clinical Support from 02/09/2019 in [place] at [facility] Total Score  0    Notes R ear trouble hearing  Fall Risk  02/09/2019 02/01/2018 01/29/2017 01/23/2016 12/26/2014  Falls in the past year? 0 0 No Yes No  Number falls in past yr: 0 - - 1 -  Injury with Fall? 0 - - Yes -  Risk for fall due to : Medication side effect - - - Impaired vision  Follow up Falls evaluation completed;Falls prevention discussed - - Falls evaluation completed -    Preventative: Colon cancer screening - 2010 Ardis Hughs) normal, some ext hemorrhoids. Given h/o adenomatous polyps, rec rpt in 5 yrs. iFOB neg yearly, last 02/2018 Mammogram -birads1 02/2018.Does breast exams at home.rpt scheduled later this month. Well woman exam- s/ptotalhysterectomy, aged out.  DEXA - normal 2010. Flu shot - yearly. Pneumovax - 2003. prevnar 2015. Td - 2013 zostavax- 2011. shingrix - discussed  Advanced directives: brings, scanned. Wants daughter [name] to be HCPOA. [place] for temporary life support/ CPR/ intubation but does not want prolonged life support if terminal condition. [place] with artifical hydration.  Seat belt use discussed  Sunscreen use discussed. No changing moles. Sees derm yearly.  Non smoker Alcohol -  1-2 glasses wine nightly  Dentist q6 mo  Eye exam yearly - cataracts removed.  Bowel - no constipation Bladder - no incontinence  "[name]" Married/remarried Husband with dx creutzfeldt-jakob prion disease 2017 then memory unit of nursing home - passed away 02-11-2016 Lives alone. 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 Interim medical history since our last visit reviewed. Allergies and medications reviewed and updated. Outpatient Medications Prior to Visit  Medication Sig Dispense Refill  . atenolol (TENORMIN) 25 MG tablet TAKE 1 TABLET TWICE DAILY 180 tablet 3  . b complex vitamins tablet Take 1 tablet by mouth daily.      . cholecalciferol (VITAMIN D) 1000 UNITS tablet Take 1,000 Units by mouth daily.      Marland Kitchen Dextromethorphan-Guaifenesin (MUCINEX DM) 30-600 MG TB12 Take 1 tablet by mouth as needed.    Marland Kitchen ibuprofen (ADVIL,MOTRIN) 200 MG tablet Take 200 mg by mouth every 6 (six) hours as needed.    . Multiple Vitamin (MULTIVITAMIN) tablet Take 1 tablet by mouth daily.      Marland Kitchen omeprazole (PRILOSEC OTC)  20 MG tablet Take 20 mg by mouth as needed.      . valsartan-hydrochlorothiazide (DIOVAN-HCT) 320-25 MG tablet TAKE 1 TABLET BY MOUTH EVERY DAY 90 tablet 3  . hydrALAZINE (APRESOLINE) 25 MG tablet TAKE 1 TABLET TWICE DAILY 180 tablet 0  . potassium chloride (K-DUR) 10 MEQ tablet Take 1 tablet (10 mEq total) by mouth every Monday, Wednesday, and Friday. 30 tablet 11   No facility-administered medications prior to visit.     Per HPI unless specifically indicated in ROS section below Review of Systems Objective:    BP 130/68 (BP Location: Left Arm, Patient Position: Sitting, Cuff Size: Normal)   Pulse 60   Temp 97.9 F  (36.6 C) (Tympanic)   Resp 16   Ht 5' 4.75" (1.645 m)   Wt 165 lb 6.4 oz (75 kg)   SpO2 100%   BMI 27.74 kg/m   Wt Readings from Last 3 Encounters:  02/09/19 165 lb 6.4 oz (75 kg)  02/09/19 165 lb (74.8 kg)  08/04/18 160 lb (72.6 kg)    Physical Exam Vitals and nursing note reviewed.  Constitutional:      General: She is not in acute distress.    Appearance: Normal appearance. She is well-developed. She is not ill-appearing.  HENT:     Head: Normocephalic and atraumatic.     Right Ear: Hearing, tympanic membrane, ear canal and external ear normal.     Left Ear: Hearing, tympanic membrane, ear canal and external ear normal.     Mouth/Throat:     Pharynx: Uvula midline.  Eyes:     General: No scleral icterus.    Extraocular Movements: Extraocular movements intact.     Conjunctiva/sclera: Conjunctivae normal.     Pupils: Pupils are equal, round, and reactive to light.  Neck:     Vascular: No carotid bruit.  Cardiovascular:     Rate and Rhythm: Normal rate and regular rhythm.     Pulses: Normal pulses.          Radial pulses are 2+ on the right side and 2+ on the left side.     Heart sounds: Normal heart sounds. No murmur.  Pulmonary:     Effort: Pulmonary effort is normal. No respiratory distress.     Breath sounds: Normal breath sounds. No wheezing, rhonchi or rales.  Abdominal:     General: Abdomen is flat. Bowel sounds are normal. There is no distension.     Palpations: Abdomen is soft. There is no mass.     Tenderness: There is no abdominal tenderness. There is no guarding or rebound.     Hernia: No hernia is present.  Musculoskeletal:        General: Normal range of motion.     Cervical back: Normal range of motion and neck supple.     Right lower leg: No edema.     Left lower leg: No edema.  Lymphadenopathy:     Cervical: No cervical adenopathy.  Skin:    General: Skin is warm and dry.     Findings: No rash.  Neurological:     General: No focal deficit  present.     Mental Status: She is alert and oriented to person, place, and time.     Comments: CN grossly intact, station and gait intact  Psychiatric:        Mood and Affect: Mood normal.        Behavior: Behavior normal.        Thought  Content: Thought content normal.        Judgment: Judgment normal.       Results for orders placed or performed in visit on 02/02/19  Hemoglobin A1c  Result Value Ref Range   Hgb A1c MFr Bld 5.4 4.6 - 6.5 %  Comprehensive metabolic panel  Result Value Ref Range   Sodium 136 135 - 145 mEq/L   Potassium 3.5 3.5 - 5.1 mEq/L   Chloride 98 96 - 112 mEq/L   CO2 31 19 - 32 mEq/L   Glucose, Bld 105 (H) 70 - 99 mg/dL   BUN 17 6 - 23 mg/dL   Creatinine, Ser 0.81 0.40 - 1.20 mg/dL   Total Bilirubin 0.5 0.2 - 1.2 mg/dL   Alkaline Phosphatase 61 39 - 117 U/L   AST 26 0 - 37 U/L   ALT 28 0 - 35 U/L   Total Protein 6.6 6.0 - 8.3 g/dL   Albumin 4.2 3.5 - 5.2 g/dL   GFR 67.02 >60.00 mL/min   Calcium 9.5 8.4 - 10.5 mg/dL  Lipid panel  Result Value Ref Range   Cholesterol 217 (H) 0 - 200 mg/dL   Triglycerides 94.0 0.0 - 149.0 mg/dL   HDL 77.90 >39.00 mg/dL   VLDL 18.8 0.0 - 40.0 mg/dL   LDL Cholesterol 120 (H) 0 - 99 mg/dL   Total CHOL/HDL Ratio 3    NonHDL 138.69    Assessment & Plan:  This visit occurred during the SARS-CoV-2 public health emergency.  Safety protocols were in place, including screening questions prior to the visit, additional usage of staff PPE, and extensive cleaning of exam room while observing appropriate contact time as indicated for disinfecting solutions.   Problem List Items Addressed This Visit    Prediabetes    Latest A1c normal.       HLD (hyperlipidemia)    Chronic, mild deterioration noted. Reviewed healthy diet choices to improve LDL levels. The ASCVD Risk score Mikey Bussing DC Jr., et al., 2013) failed to calculate for the following reasons:   The 2013 ASCVD risk score is only valid for ages 28 to 21       Relevant  Medications   hydrALAZINE (APRESOLINE) 25 MG tablet   GERD    H/o HH. Managed with PRN low dose PPI.       Essential hypertension    Chronic, stable on current regimen - continue monitoring at home.       Relevant Medications   hydrALAZINE (APRESOLINE) 25 MG tablet   Advanced care planning/counseling discussion - Primary    Advanced directives: brings, scanned. Wants daughter [name] to be HCPOA. [place] for temporary life support/ CPR/ intubation but does not want prolonged life support if terminal condition. [place] with artifical hydration.        Other Visit Diagnoses    Special screening for malignant neoplasms, colon       Relevant Orders   Fecal occult blood, imunochemical       Meds ordered this encounter  Medications  . hydrALAZINE (APRESOLINE) 25 MG tablet    Sig: Take 1 tablet (25 mg total) by mouth 2 (two) times daily.    Dispense:  180 tablet    Refill:  3   Orders Placed This Encounter  Procedures  . Fecal occult blood, imunochemical    Standing Status:   Future    Standing Expiration Date:   02/09/2020    Patient instructions: If interested, check with pharmacy about new 2 shot  shingles series (shingrix).  You are doing well today. Return as needed or in 1 year for next wellness visit.  Keep track of blood pressures, let us know if consistently >150/90.  Follow up plan: Return in about 1 year (around 02/09/2020), or if symptoms worsen or fail to improve, for medicare wellness visit.  Ria Bush, MD

## 2019-02-09 NOTE — Patient Instructions (Signed)
Kristine Owens , Thank you for taking time to come for your Medicare Wellness Visit. I appreciate your ongoing commitment to your health goals. Please review the following plan we discussed and let me know if I can assist you in the future.   Screening recommendations/referrals: Colonoscopy: no longer required Mammogram: scheduled 03/01/2019 Bone Density: Up to date, completed 01/03/2009 Recommended yearly ophthalmology/optometry visit for glaucoma screening and checkup Recommended yearly dental visit for hygiene and checkup  Vaccinations: Influenza vaccine: Up to date, completed 11/09/2018 Pneumococcal vaccine: Completed series Tdap vaccine: Up to date, completed 12/23/2011 Shingles vaccine: discussed    Advanced directives: Please bring a copy of your POA (Power of [place]) and/or Living Will to your next appointment.   Conditions/risks identified: hypertension, hyperlipidemia  Next appointment: 02/09/2019 @ 10:30 am    Preventive Care 84 Years and Older, Female Preventive care refers to lifestyle choices and visits with your health care provider that can promote health and wellness. What does preventive care include?  A yearly physical exam. This is also called an annual well check.  Dental exams once or twice a year.  Routine eye exams. Ask your health care provider how often you should have your eyes checked.  Personal lifestyle choices, including:  Daily care of your teeth and gums.  Regular physical activity.  Eating a healthy diet.  Avoiding tobacco and drug use.  Limiting alcohol use.  Practicing safe sex.  Taking low-dose aspirin every day.  Taking vitamin and mineral supplements as recommended by your health care provider. What happens during an annual well check? The services and screenings done by your health care provider during your annual well check will depend on your age, overall health, lifestyle risk factors, and family history of disease. Counseling    Your health care provider may ask you questions about your:  Alcohol use.  Tobacco use.  Drug use.  Emotional well-being.  Home and relationship well-being.  Sexual activity.  Eating habits.  History of falls.  Memory and ability to understand (cognition).  Work and work Statistician.  Reproductive health. Screening  You may have the following tests or measurements:  Height, weight, and BMI.  Blood pressure.  Lipid and cholesterol levels. These may be checked every 5 years, or more frequently if you are over 28 years old.  Skin check.  Lung cancer screening. You may have this screening every year starting at age 84 if you have a 30-pack-year history of smoking and currently smoke or have quit within the past 15 years.  Fecal occult blood test (FOBT) of the stool. You may have this test every year starting at age 84.  Flexible sigmoidoscopy or colonoscopy. You may have a sigmoidoscopy every 5 years or a colonoscopy every 10 years starting at age 84.  Hepatitis C blood test.  Hepatitis B blood test.  Sexually transmitted disease (STD) testing.  Diabetes screening. This is done by checking your blood sugar (glucose) after you have not eaten for a while (fasting). You may have this done every 1-3 years.  Bone density scan. This is done to screen for osteoporosis. You may have this done starting at age 84.  Mammogram. This may be done every 1-2 years. Talk to your health care provider about how often you should have regular mammograms. Talk with your health care provider about your test results, treatment options, and if necessary, the need for more tests. Vaccines  Your health care provider may recommend certain vaccines, such as:  Influenza vaccine. This  is recommended every year.  Tetanus, diphtheria, and acellular pertussis (Tdap, Td) vaccine. You may need a Td booster every 10 years.  Zoster vaccine. You may need this after age 84.  Pneumococcal 13-valent  conjugate (PCV13) vaccine. One dose is recommended after age 84.  Pneumococcal polysaccharide (PPSV23) vaccine. One dose is recommended after age 80. Talk to your health care provider about which screenings and vaccines you need and how often you need them. This information is not intended to replace advice given to you by your health care provider. Make sure you discuss any questions you have with your health care provider. Document Released: 02/16/2015 Document Revised: 10/10/2015 Document Reviewed: 11/21/2014 Elsevier Interactive Patient Education  2017 [place] Prevention in the Home Falls can cause injuries. They can happen to people of all ages. There are many things you can do to make your home safe and to help prevent falls. What can I do on the outside of my home?  Regularly fix the edges of walkways and driveways and fix any cracks.  Remove anything that might make you trip as you walk through a door, such as a raised step or threshold.  Trim any bushes or trees on the path to your home.  Use bright outdoor lighting.  Clear any walking paths of anything that might make someone trip, such as rocks or tools.  Regularly check to see if handrails are loose or broken. Make sure that both sides of any steps have handrails.  Any raised decks and porches should have guardrails on the edges.  Have any leaves, snow, or ice cleared regularly.  Use sand or salt on walking paths during winter.  Clean up any spills in your garage right away. This includes oil or grease spills. What can I do in the bathroom?  Use night lights.  Install grab bars by the toilet and in the tub and shower. Do not use towel bars as grab bars.  Use non-skid mats or decals in the tub or shower.  If you need to sit down in the shower, use a plastic, non-slip stool.  Keep the floor dry. Clean up any water that spills on the floor as soon as it happens.  Remove soap buildup in the tub or  shower regularly.  Attach bath mats securely with double-sided non-slip rug tape.  Do not have throw rugs and other things on the floor that can make you trip. What can I do in the bedroom?  Use night lights.  Make sure that you have a light by your bed that is easy to reach.  Do not use any sheets or blankets that are too big for your bed. They should not hang down onto the floor.  Have a firm chair that has side arms. You can use this for support while you get dressed.  Do not have throw rugs and other things on the floor that can make you trip. What can I do in the kitchen?  Clean up any spills right away.  Avoid walking on wet floors.  Keep items that you use a lot in easy-to-reach places.  If you need to reach something above you, use a strong step stool that has a grab bar.  Keep electrical cords out of the way.  Do not use floor polish or wax that makes floors slippery. If you must use wax, use non-skid floor wax.  Do not have throw rugs and other things on the floor that can make you  trip. What can I do with my stairs?  Do not leave any items on the stairs.  Make sure that there are handrails on both sides of the stairs and use them. Fix handrails that are broken or loose. Make sure that handrails are as long as the stairways.  Check any carpeting to make sure that it is firmly attached to the stairs. Fix any carpet that is loose or worn.  Avoid having throw rugs at the top or bottom of the stairs. If you do have throw rugs, attach them to the floor with carpet tape.  Make sure that you have a light switch at the top of the stairs and the bottom of the stairs. If you do not have them, ask someone to add them for you. What else can I do to help prevent falls?  Wear shoes that:  Do not have high heels.  Have rubber bottoms.  Are comfortable and fit you well.  Are closed at the toe. Do not wear sandals.  If you use a stepladder:  Make sure that it is fully  opened. Do not climb a closed stepladder.  Make sure that both sides of the stepladder are locked into place.  Ask someone to hold it for you, if possible.  Clearly mark and make sure that you can see:  Any grab bars or handrails.  First and last steps.  Where the edge of each step is.  Use tools that help you move around (mobility aids) if they are needed. These include:  Canes.  Walkers.  Scooters.  Crutches.  Turn on the lights when you go into a dark area. Replace any light bulbs as soon as they burn out.  Set up your furniture so you have a clear path. Avoid moving your furniture around.  If any of your floors are uneven, fix them.  If there are any pets around you, be aware of where they are.  Review your medicines with your doctor. Some medicines can make you feel dizzy. This can increase your chance of falling. Ask your doctor what other things that you can do to help prevent falls. This information is not intended to replace advice given to you by your health care provider. Make sure you discuss any questions you have with your health care provider. Document Released: 11/16/2008 Document Revised: 06/28/2015 Document Reviewed: 02/24/2014 Elsevier Interactive Patient Education  2017 [employer].

## 2019-02-09 NOTE — Assessment & Plan Note (Signed)
H/o HH. Managed with PRN low dose PPI.

## 2019-02-09 NOTE — Assessment & Plan Note (Addendum)
Chronic, mild deterioration noted. Reviewed healthy diet choices to improve LDL levels. The ASCVD Risk score Mikey Bussing DC Jr., et al., 2013) failed to calculate for the following reasons:   The 2013 ASCVD risk score is only valid for ages 43 to 60

## 2019-03-01 ENCOUNTER — Other Ambulatory Visit: Payer: Self-pay

## 2019-03-01 ENCOUNTER — Ambulatory Visit
Admission: RE | Admit: 2019-03-01 | Discharge: 2019-03-01 | Disposition: A | Discharge status: Home / Self Care | Payer: Medicare Other | Source: Ambulatory Visit | Attending: Family Medicine | Admitting: Family Medicine

## 2019-03-01 DIAGNOSIS — Z1231 Encounter for screening mammogram for malignant neoplasm of breast: Secondary | ICD-10-CM | POA: Diagnosis not present

## 2019-03-02 LAB — HM MAMMOGRAPHY

## 2019-03-04 ENCOUNTER — Encounter: Payer: Self-pay | Admitting: Family Medicine

## 2019-04-06 ENCOUNTER — Encounter: Payer: Self-pay | Admitting: Family Medicine

## 2019-04-06 ENCOUNTER — Other Ambulatory Visit: Payer: Self-pay

## 2019-04-06 ENCOUNTER — Ambulatory Visit (INDEPENDENT_AMBULATORY_CARE_PROVIDER_SITE_OTHER): Payer: Medicare Other | Admitting: Family Medicine

## 2019-04-06 ENCOUNTER — Telehealth: Payer: Self-pay

## 2019-04-06 VITALS — BP 162/78 | HR 68 | Temp 97.8°F | Ht 64.75 in | Wt 167.6 lb

## 2019-04-06 DIAGNOSIS — R202 Paresthesia of skin: Secondary | ICD-10-CM

## 2019-04-06 DIAGNOSIS — R0989 Other specified symptoms and signs involving the circulatory and respiratory systems: Secondary | ICD-10-CM

## 2019-04-06 DIAGNOSIS — I10 Essential (primary) hypertension: Secondary | ICD-10-CM | POA: Diagnosis not present

## 2019-04-06 LAB — BASIC METABOLIC PANEL
BUN: 11 mg/dL (ref 6–23)
CO2: 30 mEq/L (ref 19–32)
Calcium: 9.9 mg/dL (ref 8.4–10.5)
Chloride: 93 mEq/L — ABNORMAL LOW (ref 96–112)
Creatinine, Ser: 0.77 mg/dL (ref 0.40–1.20)
GFR: 71.03 mL/min (ref >60.00)
Glucose, Bld: 96 mg/dL (ref 70–99)
Potassium: 3.4 mEq/L — ABNORMAL LOW (ref 3.5–5.1)
Sodium: 131 mEq/L — ABNORMAL LOW (ref 135–145)

## 2019-04-06 LAB — TSH: TSH: 1.98 u[IU]/mL (ref 0.35–4.50)

## 2019-04-06 LAB — CBC WITH DIFFERENTIAL/PLATELET
Basophils Absolute: 0.1 10*3/uL (ref 0.0–0.1)
Basophils Relative: 1 % (ref 0.0–3.0)
Eosinophils Absolute: 0.1 10*3/uL (ref 0.0–0.7)
Eosinophils Relative: 1 % (ref 0.0–5.0)
HCT: 39.6 % (ref 36.0–46.0)
Hemoglobin: 13.7 g/dL (ref 12.0–15.0)
Lymphocytes Relative: 18.4 % (ref 12.0–46.0)
Lymphs Abs: 1.2 10*3/uL (ref 0.7–4.0)
MCHC: 34.4 g/dL (ref 30.0–36.0)
MCV: 92.6 fl (ref 78.0–100.0)
Monocytes Absolute: 0.5 10*3/uL (ref 0.1–1.0)
Monocytes Relative: 7.6 % (ref 3.0–12.0)
Neutro Abs: 4.8 10*3/uL (ref 1.4–7.7)
Neutrophils Relative %: 72 % (ref 43.0–77.0)
Platelets: 258 10*3/uL (ref 150.0–400.0)
RBC: 4.28 Mil/uL (ref 3.87–5.11)
RDW: 13 % (ref 11.5–15.5)
WBC: 6.7 10*3/uL (ref 4.0–10.5)

## 2019-04-06 LAB — MICROALBUMIN / CREATININE URINE RATIO
Creatinine,U: 68.6 mg/dL
Microalb Creat Ratio: 2.4 mg/g (ref 0.0–30.0)
Microalb, Ur: 1.6 mg/dL (ref 0.0–1.9)

## 2019-04-06 MED ORDER — ATORVASTATIN CALCIUM 20 MG PO TABS: 20.0000 mg | ORAL_TABLET | Freq: Every day | ORAL | 6 refills | Status: DC

## 2019-04-06 MED ORDER — HYDRALAZINE HCL 25 MG PO TABS: 50.0000 mg | ORAL_TABLET | Freq: Two times a day (BID) | ORAL | 1 refills | Status: DC

## 2019-04-06 MED ORDER — ASPIRIN EC 81 MG PO TBEC: 81.0000 mg | DELAYED_RELEASE_TABLET | Freq: Every day | ORAL | Status: AC

## 2019-04-06 NOTE — Assessment & Plan Note (Addendum)
Quickly resolved. Check carotid US, labwork, start statin and aspirin 81mg . Pt agrees with plan.

## 2019-04-06 NOTE — Patient Instructions (Addendum)
EKG today Labs today We will order neck artery ultrasound.  Start aspirin 81mg  enteric coated daily.  Start atorvastatin cholesterol medicine 20mg  daily.  Increase hydralazine to 50mg  twice daily.  Return in 2-3 weeks for recheck blood pressure.  If new or worsening symptoms, seek urgent care (if stroke symptoms, call 911).  Work on low salt/sodium diet - goal <1.5gm (1,500mg ) per day. Eat a diet high in fruits/vegetables and whole grains.  Look into mediterranean and DASH diet. Goal activity is 128min/wk of moderate intensity exercise.  This can be split into 30 minute chunks.  If you are not at this level, you can start with smaller 10-15 min increments and slowly build up activity. Look at [place].org for more resources

## 2019-04-06 NOTE — Progress Notes (Signed)
This visit was conducted in person.  BP (!) 162/78 (BP Location: Right Arm, Patient Position: Sitting, Cuff Size: Normal)   Pulse 68   Temp 97.8 F (36.6 C) (Temporal)   Ht 5' 4.75" (1.645 m)   Wt 167 lb 9 oz (76 kg)   SpO2 98%   BMI 28.10 kg/m   BP Readings from Last 3 Encounters:  04/06/19 (!) 162/78  02/09/19 130/68  02/09/19 (!) 150/80  On repeat, 168/90  CC: elevated blood pressures Subjective:    Patient ID: Kristine Owens, female    DOB: 1930-05-26, 86 y.o.   MRN: [id number]  HPI: Kristine Owens is a 86 y.o. female presenting on 04/06/2019 for Hypertension (C/o higher than normal BP readings.  Started yesterday.  )   Over last 1-2 days noticing increasing blood pressures - checked her BP this morning due to feeling off, increasing shakiness, anxiety. Highest reading 192/70. No missed doses of antihypertensives - takes hydralazine 25mg  bid, atenolol 25mg  bid and valsartan hctz 320/25mg  daily. No increased salt intake recently. No recent new OTC or supplements. She notes BP tends to run higher in the mornings then drops later in the day.   Several episodes this morning of left arm paresthesias and L lip paresthesias.   No unilateral weakness, slurred speech, confusion, vision changes, HA, chest pain, dyspnea. No leg swelling. No episodes of palpitations/racing heart.   Brother passed away end of 28-Feb-2022.  Cousin passed away a few days ago.      Relevant past medical, surgical, family and social history reviewed and updated as indicated. Interim medical history since our last visit reviewed. Allergies and medications reviewed and updated. Outpatient Medications Prior to Visit  Medication Sig Dispense Refill  . atenolol (TENORMIN) 25 MG tablet TAKE 1 TABLET TWICE DAILY 180 tablet 3  . b complex vitamins tablet Take 1 tablet by mouth daily.      . cholecalciferol (VITAMIN D) 1000 UNITS tablet Take 1,000 Units by mouth daily.      Marland Kitchen  Dextromethorphan-Guaifenesin (MUCINEX DM) 30-600 MG TB12 Take 1 tablet by mouth as needed.    Marland Kitchen ibuprofen (ADVIL,MOTRIN) 200 MG tablet Take 200 mg by mouth every 6 (six) hours as needed.    . Multiple Vitamin (MULTIVITAMIN) tablet Take 1 tablet by mouth daily.      Marland Kitchen omeprazole (PRILOSEC OTC) 20 MG tablet Take 20 mg by mouth as needed.      . valsartan-hydrochlorothiazide (DIOVAN-HCT) 320-25 MG tablet TAKE 1 TABLET BY MOUTH EVERY DAY 90 tablet 3  . hydrALAZINE (APRESOLINE) 25 MG tablet Take 1 tablet (25 mg total) by mouth 2 (two) times daily. 180 tablet 3   No facility-administered medications prior to visit.     Per HPI unless specifically indicated in ROS section below Review of Systems Objective:    BP (!) 162/78 (BP Location: Right Arm, Patient Position: Sitting, Cuff Size: Normal)   Pulse 68   Temp 97.8 F (36.6 C) (Temporal)   Ht 5' 4.75" (1.645 m)   Wt 167 lb 9 oz (76 kg)   SpO2 98%   BMI 28.10 kg/m   Wt Readings from Last 3 Encounters:  04/06/19 167 lb 9 oz (76 kg)  02/09/19 165 lb 6.4 oz (75 kg)  02/09/19 165 lb (74.8 kg)    Physical Exam Vitals and nursing note reviewed.  Constitutional:      Appearance: Normal appearance. She is not ill-appearing.  Eyes:     Extraocular Movements: Extraocular  movements intact.     Pupils: Pupils are equal, round, and reactive to light.  Neck:     Vascular: Carotid bruit (R) present.  Cardiovascular:     Rate and Rhythm: Normal rate and regular rhythm.     Pulses: Normal pulses.     Heart sounds: Normal heart sounds. No murmur.  Pulmonary:     Effort: Pulmonary effort is normal. No respiratory distress.     Breath sounds: Normal breath sounds. No wheezing, rhonchi or rales.  Musculoskeletal:     Cervical back: Normal range of motion and neck supple.     Right lower leg: No edema.     Left lower leg: No edema.  Skin:    General: Skin is warm and dry.     Capillary Refill: Capillary refill takes less than 2 seconds.      Findings: No rash.  Neurological:     General: No focal deficit present.     Mental Status: She is alert.     Cranial Nerves: Cranial nerves are intact.     Sensory: Sensation is intact.     Motor: Motor function is intact.     Coordination: Coordination is intact. Romberg sign negative. Coordination normal. Finger-Nose-Finger Test normal.     Gait: Gait is intact.     Comments:  CN 2-12 intact FTN intact EOMI No pronator drift  Psychiatric:        Mood and Affect: Mood normal.        Behavior: Behavior normal.       Results for orders placed or performed in visit on 03/04/19  HM MAMMOGRAPHY  Result Value Ref Range   HM Mammogram 0-4 Bi-Rad 0-4 Bi-Rad, Self Reported Normal  HM MAMMOGRAPHY  Result Value Ref Range   HM Mammogram 0-4 Bi-Rad 0-4 Bi-Rad, Self Reported Normal   EKG - NSR rate 60, normal axis, intervals, no acute ST/T changes Assessment & Plan:  This visit occurred during the SARS-CoV-2 public health emergency.  Safety protocols were in place, including screening questions prior to the visit, additional usage of staff PPE, and extensive cleaning of exam room while observing appropriate contact time as indicated for disinfecting solutions.   Problem List Items Addressed This Visit    Essential hypertension - Primary    Sudden worsening hypertension over last 1-2 days, unclear etiology - check labwork today including microalbumin and EKG. With recent L sided paresthesias and new R carotid bruit, check carotid US, start aspirin 81mg  and atorvastatin 20mg  daily. Will also increase hydralazine to 50mg  bid. Close outpatient f/u (2-3 wks). Red flags to seek urgent care reviewed. Pt agrees with plan.       Relevant Medications   aspirin EC 81 MG tablet   atorvastatin (LIPITOR) 20 MG tablet   hydrALAZINE (APRESOLINE) 25 MG tablet   Other Relevant Orders   Microalbumin / creatinine urine ratio   TSH   Basic metabolic panel   CBC with Differential/Platelet   EKG 12-Lead  (Completed)   Arm paresthesia, left    Quickly resolved. Check carotid US, labwork, start statin and aspirin 81mg . Pt agrees with plan.        Other Visit Diagnoses    Right carotid bruit       Relevant Orders   VAS US CAROTID       Meds ordered this encounter  Medications  . aspirin EC 81 MG tablet    Sig: Take 1 tablet (81 mg total) by mouth daily.  Dispense:     . atorvastatin (LIPITOR) 20 MG tablet    Sig: Take 1 tablet (20 mg total) by mouth daily.    Dispense:  30 tablet    Refill:  6  . hydrALAZINE (APRESOLINE) 25 MG tablet    Sig: Take 2 tablets (50 mg total) by mouth 2 (two) times daily.    Dispense:  360 tablet    Refill:  1   Orders Placed This Encounter  Procedures  . Microalbumin / creatinine urine ratio  . TSH  . Basic metabolic panel  . CBC with Differential/Platelet  . EKG 12-Lead    Patient Instructions  EKG today Labs today We will order neck artery ultrasound.  Start aspirin 81mg  enteric coated daily.  Start atorvastatin cholesterol medicine 20mg  daily.  Increase hydralazine to 50mg  twice daily.  Return in 2-3 weeks for recheck blood pressure.  If new or worsening symptoms, seek urgent care (if stroke symptoms, call 911).  Work on low salt/sodium diet - goal <1.5gm (1,500mg ) per day. Eat a diet high in fruits/vegetables and whole grains.  Look into mediterranean and DASH diet. Goal activity is 149min/wk of moderate intensity exercise.  This can be split into 30 minute chunks.  If you are not at this level, you can start with smaller 10-15 min increments and slowly build up activity. Look at [place]org for more resources    Follow up plan: Return in about 2 weeks (around 04/20/2019) for follow up visit.  Ria Bush, MD

## 2019-04-06 NOTE — Telephone Encounter (Signed)
Seen today. 

## 2019-04-06 NOTE — Assessment & Plan Note (Addendum)
Sudden worsening hypertension over last 1-2 days, unclear etiology - check labwork today including microalbumin and EKG. With recent L sided paresthesias and new R carotid bruit, check carotid US, start aspirin 81mg  and atorvastatin 20mg  daily. Will also increase hydralazine to 50mg  bid. Close outpatient f/u (2-3 wks). Red flags to seek urgent care reviewed. Pt agrees with plan.

## 2019-04-06 NOTE — Telephone Encounter (Signed)
Patient called with concerns of elevated b/p. Patient states she noticed yesterday her b/p was running higher-it was 160 on top and bottom number she was not sure but said it was normal. She had hard time sleeping last night, felt anxious like. She has checked her b/p several times this morning and reading has been around 180/88. She had a little of bit tingling in her left arm that lasted for 1 minute this morning. She denies any other symptoms such as chest pain, pressure, weakness, headache, slurred speech, SOB. Per patient "she just did not feel right when she had the tingling sensation." Spoke with Dr Darnell Level and scheduled patient to be seen today in the office. Given ER precautions if symptoms change before her appointment. Patient verbalized understating.  FYI to PCP

## 2019-04-07 ENCOUNTER — Ambulatory Visit (HOSPITAL_COMMUNITY)
Admission: RE | Admit: 2019-04-07 | Discharge: 2019-04-07 | Disposition: A | Discharge status: Home / Self Care | Payer: Medicare Other | Source: Ambulatory Visit | Attending: Cardiology | Admitting: Cardiology

## 2019-04-07 DIAGNOSIS — R0989 Other specified symptoms and signs involving the circulatory and respiratory systems: Secondary | ICD-10-CM | POA: Insufficient documentation

## 2019-04-08 ENCOUNTER — Encounter (INDEPENDENT_AMBULATORY_CARE_PROVIDER_SITE_OTHER): Payer: Medicare Other | Admitting: Ophthalmology

## 2019-04-08 DIAGNOSIS — H35033 Hypertensive retinopathy, bilateral: Secondary | ICD-10-CM | POA: Diagnosis not present

## 2019-04-08 DIAGNOSIS — H43813 Vitreous degeneration, bilateral: Secondary | ICD-10-CM

## 2019-04-08 DIAGNOSIS — H353132 Nonexudative age-related macular degeneration, bilateral, intermediate dry stage: Secondary | ICD-10-CM

## 2019-04-08 DIAGNOSIS — D3132 Benign neoplasm of left choroid: Secondary | ICD-10-CM

## 2019-04-08 DIAGNOSIS — I10 Essential (primary) hypertension: Secondary | ICD-10-CM

## 2019-04-08 DIAGNOSIS — H33301 Unspecified retinal break, right eye: Secondary | ICD-10-CM | POA: Diagnosis not present

## 2019-04-14 ENCOUNTER — Encounter: Payer: Self-pay | Admitting: Family Medicine

## 2019-04-14 DIAGNOSIS — H35373 Puckering of macula, bilateral: Secondary | ICD-10-CM | POA: Diagnosis not present

## 2019-04-14 DIAGNOSIS — H35033 Hypertensive retinopathy, bilateral: Secondary | ICD-10-CM | POA: Diagnosis not present

## 2019-04-14 DIAGNOSIS — Z961 Presence of intraocular lens: Secondary | ICD-10-CM | POA: Diagnosis not present

## 2019-04-14 DIAGNOSIS — H40013 Open angle with borderline findings, low risk, bilateral: Secondary | ICD-10-CM | POA: Diagnosis not present

## 2019-04-18 ENCOUNTER — Other Ambulatory Visit: Payer: Self-pay | Admitting: Family Medicine

## 2019-04-20 ENCOUNTER — Encounter: Payer: Self-pay | Admitting: Family Medicine

## 2019-04-20 ENCOUNTER — Ambulatory Visit (INDEPENDENT_AMBULATORY_CARE_PROVIDER_SITE_OTHER): Payer: Medicare Other | Admitting: Family Medicine

## 2019-04-20 ENCOUNTER — Other Ambulatory Visit: Payer: Self-pay

## 2019-04-20 VITALS — BP 140/60 | HR 63 | Temp 97.9°F | Ht 64.75 in | Wt 169.3 lb

## 2019-04-20 DIAGNOSIS — E785 Hyperlipidemia, unspecified: Secondary | ICD-10-CM | POA: Diagnosis not present

## 2019-04-20 DIAGNOSIS — R202 Paresthesia of skin: Secondary | ICD-10-CM

## 2019-04-20 DIAGNOSIS — I10 Essential (primary) hypertension: Secondary | ICD-10-CM | POA: Diagnosis not present

## 2019-04-20 NOTE — Patient Instructions (Addendum)
Continue aspirin and atorvastatin at this time.  Continue current blood pressure medicines I think you are doing well today Return as needed or in 3-4 months for follow up visit. Return sooner if blood pressure consistently >150 on the top number.

## 2019-04-20 NOTE — Assessment & Plan Note (Signed)
Largely systolic hypertension. Improved control with increased hydralazine dose. Continue current regimen. She will continue monitoring BP at home. Advised to wait 1 hr after BP meds to check. Update if persistently SBP >150.

## 2019-04-20 NOTE — Progress Notes (Signed)
This visit was conducted in person.  BP 140/60 (BP Location: Left Arm, Patient Position: Sitting, Cuff Size: Normal)   Pulse 63   Temp 97.9 F (36.6 C) (Temporal)   Ht 5' 4.75" (1.645 m)   Wt 169 lb 5 oz (76.8 kg)   SpO2 98%   BMI 28.39 kg/m   On repeat 150/64  CC: f/u visit HTN Subjective:    Patient ID: Kristine Owens, female    DOB: [birth year], 86 y.o.   MRN: [id number]  HPI: Kristine Owens is a 86 y.o. female presenting on 04/20/2019 for Hypertension (Here for 2-3 wk f/u.)   See prior note for details.  Last visit endorsed elevated readings associated with L arm paresthesias and L lip paresthesias. Workup included reassuring carotid US (123456 LICA stenosis). We also started aspirin 81mg  and atorvastatin 20mg  daily and increased hydralazine to 50mg  twice daily. No further paresthesias.    HTN - Compliant with current antihypertensive regimen of hydralazine 50mg  BID, atenolol 25mg  bid, valsartan hctz 320/25mg  daily. Does check blood pressures at home: high in the mornings - 0000000 systolic (diastolics ok). No low blood pressure readings or symptoms of dizziness/syncope. Denies HA, vision changes, CP/tightness, SOB, leg swelling.   Notes some decreased energy levels.  Gym just opened. She has restarted working out.      Relevant past medical, surgical, family and social history reviewed and updated as indicated. Interim medical history since our last visit reviewed. Allergies and medications reviewed and updated. Outpatient Medications Prior to Visit  Medication Sig Dispense Refill  . aspirin EC 81 MG tablet Take 1 tablet (81 mg total) by mouth daily.    Marland Kitchen atenolol (TENORMIN) 25 MG tablet TAKE 1 TABLET TWICE DAILY 180 tablet 3  . atorvastatin (LIPITOR) 20 MG tablet Take 1 tablet (20 mg total) by mouth daily. 30 tablet 6  . b complex vitamins tablet Take 1 tablet by mouth daily.      . cholecalciferol (VITAMIN D) 1000 UNITS tablet Take 1,000 Units by mouth daily.       Marland Kitchen Dextromethorphan-Guaifenesin (MUCINEX DM) 30-600 MG TB12 Take 1 tablet by mouth as needed.    . hydrALAZINE (APRESOLINE) 25 MG tablet Take 2 tablets (50 mg total) by mouth 2 (two) times daily. 360 tablet 1  . ibuprofen (ADVIL,MOTRIN) 200 MG tablet Take 200 mg by mouth every 6 (six) hours as needed.    . Multiple Vitamin (MULTIVITAMIN) tablet Take 1 tablet by mouth daily.      Marland Kitchen omeprazole (PRILOSEC OTC) 20 MG tablet Take 20 mg by mouth as needed.      . valsartan-hydrochlorothiazide (DIOVAN-HCT) 320-25 MG tablet TAKE 1 TABLET EVERY DAY 90 tablet 3   No facility-administered medications prior to visit.     Per HPI unless specifically indicated in ROS section below Review of Systems Objective:    BP 140/60 (BP Location: Left Arm, Patient Position: Sitting, Cuff Size: Normal)   Pulse 63   Temp 97.9 F (36.6 C) (Temporal)   Ht 5' 4.75" (1.645 m)   Wt 169 lb 5 oz (76.8 kg)   SpO2 98%   BMI 28.39 kg/m   Wt Readings from Last 3 Encounters:  04/20/19 169 lb 5 oz (76.8 kg)  04/06/19 167 lb 9 oz (76 kg)  02/09/19 165 lb 6.4 oz (75 kg)    Physical Exam Vitals and nursing note reviewed.  Constitutional:      Appearance: Normal appearance. She is not ill-appearing.  Eyes:  Extraocular Movements: Extraocular movements intact.     Pupils: Pupils are equal, round, and reactive to light.     Comments: S/p bilat cataract extraction  Cardiovascular:     Rate and Rhythm: Normal rate and regular rhythm.     Pulses: Normal pulses.     Heart sounds: Normal heart sounds. No murmur.  Pulmonary:     Effort: Pulmonary effort is normal. No respiratory distress.     Breath sounds: Normal breath sounds. No wheezing, rhonchi or rales.  Musculoskeletal:     Right lower leg: No edema.     Left lower leg: No edema.  Neurological:     Mental Status: She is alert.  Psychiatric:        Mood and Affect: Mood normal.        Behavior: Behavior normal.       Results for orders placed or performed  in visit on 04/06/19  Microalbumin / creatinine urine ratio  Result Value Ref Range   Microalb, Ur 1.6 0.0 - 1.9 mg/dL   Creatinine,U 68.6 mg/dL   Microalb Creat Ratio 2.4 0.0 - 30.0 mg/g  TSH  Result Value Ref Range   TSH 1.98 0.35 - 4.50 uIU/mL  Basic metabolic panel  Result Value Ref Range   Sodium 131 (L) 135 - 145 mEq/L   Potassium 3.4 (L) 3.5 - 5.1 mEq/L   Chloride 93 (L) 96 - 112 mEq/L   CO2 30 19 - 32 mEq/L   Glucose, Bld 96 70 - 99 mg/dL   BUN 11 6 - 23 mg/dL   Creatinine, Ser 0.77 0.40 - 1.20 mg/dL   GFR 71.03 >60.00 mL/min   Calcium 9.9 8.4 - 10.5 mg/dL  CBC with Differential/Platelet  Result Value Ref Range   WBC 6.7 4.0 - 10.5 K/uL   RBC 4.28 3.87 - 5.11 Mil/uL   Hemoglobin 13.7 12.0 - 15.0 g/dL   HCT 39.6 36.0 - 46.0 %   MCV 92.6 78.0 - 100.0 fl   MCHC 34.4 30.0 - 36.0 g/dL   RDW 13.0 11.5 - 15.5 %   Platelets 258.0 150.0 - 400.0 K/uL   Neutrophils Relative % 72.0 43.0 - 77.0 %   Lymphocytes Relative 18.4 12.0 - 46.0 %   Monocytes Relative 7.6 3.0 - 12.0 %   Eosinophils Relative 1.0 0.0 - 5.0 %   Basophils Relative 1.0 0.0 - 3.0 %   Neutro Abs 4.8 1.4 - 7.7 K/uL   Lymphs Abs 1.2 0.7 - 4.0 K/uL   Monocytes Absolute 0.5 0.1 - 1.0 K/uL   Eosinophils Absolute 0.1 0.0 - 0.7 K/uL   Basophils Absolute 0.1 0.0 - 0.1 K/uL   Assessment & Plan:  This visit occurred during the SARS-CoV-2 public health emergency.  Safety protocols were in place, including screening questions prior to the visit, additional usage of staff PPE, and extensive cleaning of exam room while observing appropriate contact time as indicated for disinfecting solutions.   Problem List Items Addressed This Visit    HLD (hyperlipidemia)    Tolerating atorvastatin 20mg  - continue. The ASCVD Risk score Mikey Bussing DC Jr., et al., 2013) failed to calculate for the following reasons:   The 2013 ASCVD risk score is only valid for ages 4 to 8       Essential hypertension    Largely systolic hypertension.  Improved control with increased hydralazine dose. Continue current regimen. She will continue monitoring BP at home. Advised to wait 1 hr after BP meds to check.  Update if persistently SBP >150.       Arm paresthesia, left    This has fully resolved.           No orders of the defined types were placed in this encounter.  No orders of the defined types were placed in this encounter.   Patient Instructions  Continue aspirin and atorvastatin at this time.  Continue current blood pressure medicines I think you are doing well today Return as needed or in 3-4 months for follow up visit. Return sooner if blood pressure consistently >150 on the top number.    Follow up plan: Return in about 3 months (around 07/21/2019) for follow up visit.  Ria Bush, MD

## 2019-04-20 NOTE — Assessment & Plan Note (Signed)
This has fully resolved.  

## 2019-04-20 NOTE — Assessment & Plan Note (Signed)
Tolerating atorvastatin 20mg  - continue. The ASCVD Risk score Kristine Owens., et al., 2013) failed to calculate for the following reasons:   The 2013 ASCVD risk score is only valid for ages 66 to 28

## 2019-04-25 ENCOUNTER — Encounter: Payer: Self-pay | Admitting: Family Medicine

## 2019-04-25 DIAGNOSIS — H35033 Hypertensive retinopathy, bilateral: Secondary | ICD-10-CM | POA: Insufficient documentation

## 2019-04-25 DIAGNOSIS — H40003 Preglaucoma, unspecified, bilateral: Secondary | ICD-10-CM | POA: Insufficient documentation

## 2019-08-16 ENCOUNTER — Ambulatory Visit (INDEPENDENT_AMBULATORY_CARE_PROVIDER_SITE_OTHER): Payer: Medicare Other | Admitting: Family Medicine

## 2019-08-16 ENCOUNTER — Encounter: Payer: Self-pay | Admitting: Family Medicine

## 2019-08-16 ENCOUNTER — Other Ambulatory Visit: Payer: Self-pay

## 2019-08-16 VITALS — BP 142/60 | HR 69 | Temp 97.8°F | Ht 64.75 in | Wt 165.6 lb

## 2019-08-16 DIAGNOSIS — E785 Hyperlipidemia, unspecified: Secondary | ICD-10-CM | POA: Diagnosis not present

## 2019-08-16 DIAGNOSIS — R202 Paresthesia of skin: Secondary | ICD-10-CM | POA: Diagnosis not present

## 2019-08-16 DIAGNOSIS — I10 Essential (primary) hypertension: Secondary | ICD-10-CM

## 2019-08-16 NOTE — Patient Instructions (Addendum)
Continue current medicines.  Consider muscle function supplement called coenzyme Q10 for statin related muscle discomfort.  Return as needed or in 6 months for wellness visit.  Continue monitoring blood pressures at home, let me know if staying >150 on top.

## 2019-08-16 NOTE — Assessment & Plan Note (Signed)
Stable period on atorvastatin. Some myalgias - suggested trial of CoQ10.

## 2019-08-16 NOTE — Assessment & Plan Note (Signed)
Chronic, overall stable. Continue current regimen. AM readings tend to be high, PM readings largely well controlled.

## 2019-08-16 NOTE — Progress Notes (Signed)
This visit was conducted in person.  BP (!) 142/60 (BP Location: Right Arm, Cuff Size: Normal)   Pulse 69   Temp 97.8 F (36.6 C) (Temporal)   Ht 5' 4.75" (1.645 m)   Wt 165 lb 9 oz (75.1 kg)   SpO2 99%   BMI 27.76 kg/m    CC: 4 mo f/u visit  Subjective:    Patient ID: Kristine Owens, female    DOB: 1930/08/16, 86 y.o.   MRN: [id number]  HPI: Kristine Owens is a 86 y.o. female presenting on 08/16/2019 for Hypertension (Here for 3-4 mo f/u.)   Notes some ongoing fatigue over the past few months.  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 Sleep maintenance insomnia.   HTN - Compliant with current antihypertensive regimen of hydralazine 50mg  bid, atenolol 25mg  bid, valsartan hctz 320/25mg  daily. Does check blood pressures at home: am 588-502 systolic, pm 774/12I. No low blood pressure readings or symptoms of dizziness/syncope.  Denies HA, vision changes, CP/tightness, SOB, leg swelling.    HLD - atorvastatin started earlier this year, tolerating well.  Aspirin also recently started after episode of L arm paresthesia that has since completely resolved.      Relevant past medical, surgical, family and social history reviewed and updated as indicated. Interim medical history since our last visit reviewed. Allergies and medications reviewed and updated. Outpatient Medications Prior to Visit  Medication Sig Dispense Refill  . aspirin EC 81 MG tablet Take 1 tablet (81 mg total) by mouth daily.    Marland Kitchen atenolol (TENORMIN) 25 MG tablet TAKE 1 TABLET TWICE DAILY 180 tablet 3  . atorvastatin (LIPITOR) 20 MG tablet Take 1 tablet (20 mg total) by mouth daily. 30 tablet 6  . b complex vitamins tablet Take 1 tablet by mouth daily.      . cholecalciferol (VITAMIN D) 1000 UNITS tablet Take 1,000 Units by mouth daily.      Marland Kitchen Dextromethorphan-Guaifenesin (MUCINEX DM) 30-600 MG TB12 Take 1 tablet by mouth as needed.    . hydrALAZINE (APRESOLINE) 25 MG tablet Take 2 tablets (50 mg total)  by mouth 2 (two) times daily. 360 tablet 1  . ibuprofen (ADVIL,MOTRIN) 200 MG tablet Take 200 mg by mouth every 6 (six) hours as needed.    . Multiple Vitamin (MULTIVITAMIN) tablet Take 1 tablet by mouth daily.      Marland Kitchen omeprazole (PRILOSEC OTC) 20 MG tablet Take 20 mg by mouth as needed.      . valsartan-hydrochlorothiazide (DIOVAN-HCT) 320-25 MG tablet TAKE 1 TABLET EVERY DAY 90 tablet 3   No facility-administered medications prior to visit.     Per HPI unless specifically indicated in ROS section below Review of Systems Objective:  BP (!) 142/60 (BP Location: Right Arm, Cuff Size: Normal)   Pulse 69   Temp 97.8 F (36.6 C) (Temporal)   Ht 5' 4.75" (1.645 m)   Wt 165 lb 9 oz (75.1 kg)   SpO2 99%   BMI 27.76 kg/m   Wt Readings from Last 3 Encounters:  08/16/19 165 lb 9 oz (75.1 kg)  04/20/19 169 lb 5 oz (76.8 kg)  04/06/19 167 lb 9 oz (76 kg)      Physical Exam Vitals and nursing note reviewed.  Constitutional:      Appearance: Normal appearance. She is not ill-appearing.  Eyes:     Extraocular Movements: Extraocular movements intact.     Pupils: Pupils are equal, round, and reactive to light.  Cardiovascular:  Rate and Rhythm: Normal rate and regular rhythm.     Pulses: Normal pulses.     Heart sounds: Normal heart sounds. No murmur heard.   Pulmonary:     Effort: Pulmonary effort is normal. No respiratory distress.     Breath sounds: Normal breath sounds. No wheezing, rhonchi or rales.  Musculoskeletal:     Right lower leg: No edema.     Left lower leg: No edema.  Skin:    Findings: No rash.  Neurological:     Mental Status: She is alert.  Psychiatric:        Mood and Affect: Mood normal.        Behavior: Behavior normal.       Assessment & Plan:  This visit occurred during the SARS-CoV-2 public health emergency.  Safety protocols were in place, including screening questions prior to the visit, additional usage of staff PPE, and extensive cleaning of exam  room while observing appropriate contact time as indicated for disinfecting solutions.   Problem List Items Addressed This Visit    HLD (hyperlipidemia)    Stable period on atorvastatin. Some myalgias - suggested trial of CoQ10.       Essential hypertension - Primary    Chronic, overall stable. Continue current regimen. AM readings tend to be high, PM readings largely well controlled.      Arm paresthesia, left    Continue aspirin, statin.           No orders of the defined types were placed in this encounter.  No orders of the defined types were placed in this encounter.  Patient Instructions  Continue current medicines.  Consider muscle function supplement called coenzyme Q10 for statin related muscle discomfort.  Return as needed or in 6 months for wellness visit.  Continue monitoring blood pressures at home, let me know if staying >150 on top.    Follow up plan: Return in about 6 months (around 02/16/2020) for medicare wellness visit.  Ria Bush, MD

## 2019-08-16 NOTE — Assessment & Plan Note (Signed)
Continue aspirin, statin.  

## 2019-09-06 ENCOUNTER — Telehealth: Payer: Self-pay

## 2019-09-06 MED ORDER — HYDRALAZINE HCL 50 MG PO TABS: 50.0000 mg | ORAL_TABLET | Freq: Three times a day (TID) | ORAL | 1 refills | Status: DC

## 2019-09-06 NOTE — Telephone Encounter (Signed)
Pt called concerned with elevated blood pressure this morning.... BP was 200/81 HR in the 70s per pt report Rechecked 30 mins later and BP was 170/?   I had pt recheck BP while I waited on the phone, 2 hours after taking medication... was 158/74 and HR 70.... Denies headache, SOB, CP/tightness.... I did advised pt it is best to check BP about 2 hours after taking medication for the most accurate reading.  Please advise

## 2019-09-06 NOTE — Telephone Encounter (Addendum)
Agree those are too high.  Recommend increasing hydralazine to 50mg  TID - continue atenolol and valsartan/hctz as per prior. Watch for low blood pressures esp at night time, let us know how morning readings are running 2 wks after med change.  Let us know if any trouble tolerating higher dose.  Currently taking 25mg  2 tab at a time. i've sent in 50mg  tabs so she only has to take one at a time, sent locally.

## 2019-09-06 NOTE — Addendum Note (Signed)
Addended by: Ria Bush on: 09/06/2019 01:31 PM   Modules accepted: Orders

## 2019-09-07 NOTE — Telephone Encounter (Signed)
Spoke with pt relaying Dr. G's message.  Pt verbalizes understanding and expresses her thanks.  

## 2019-10-06 DIAGNOSIS — H40013 Open angle with borderline findings, low risk, bilateral: Secondary | ICD-10-CM | POA: Diagnosis not present

## 2019-10-17 ENCOUNTER — Telehealth: Payer: Self-pay

## 2019-10-17 NOTE — Telephone Encounter (Signed)
Patient left a VM on triage line, asking to speak with Kristine Owens. She states her BP is elevated this AM and she needs to speak with her. I left a VM for patient to return phone call to obtain more information regarding BP. Will route to [place] and myself for when patient calls back.

## 2019-10-18 NOTE — Telephone Encounter (Addendum)
Lvm asking pt to call back.   Need more details about elevated BP to  see if she needs OV.

## 2019-10-19 NOTE — Telephone Encounter (Signed)
[facility] - Client TELEPHONE ADVICE RECORD [facility] Patient Name: Kristine Owens Gender: Female DOB: [birth year] Age: 86 Y 9 M 29 D Return Phone Number: [phone] (Primary), [phone] (Secondary) Address[place] [address] Client [facility] - Client Client Site [place] Physician Ria Bush - MD Contact Type Call Who Is Calling Patient / Member / Family / Caregiver Call Type Triage / Clinical Relationship To Patient Self Return Phone Number [phone] (Primary) Chief Complaint BLOOD PRESSURE HIGH - Systolic (top number) 356 or greater (with symptoms) Reason for Call Symptomatic / Request for Health Information Initial Comment Caller states her blood pressure has been 200/80 Translation No Nurse Assessment Nurse: Rock Nephew, RN, Juliann Pulse Date/Time (Eastern Time): 10/19/2019 9:43:49 AM Confirm and document reason for call. If symptomatic, describe symptoms. ---Caller stated her BP this morning is 180/86 but two days ago it was 200/80 . Has the patient had close contact with a person known or suspected to have the novel coronavirus illness OR traveled / lives in area with major community spread (including international travel) in the last 14 days from the onset of symptoms? * If Asymptomatic, screen for exposure and travel within the last 14 days. ---No Does the patient have any new or worsening symptoms? ---Yes Will a triage be completed? ---Yes Related visit to physician within the last 2 weeks? ---No Does the PT have any chronic conditions? (i.e. diabetes, asthma, this includes High risk factors for pregnancy, etc.) ---Yes List chronic conditions. ---HTN Is this a behavioral health or substance abuse call? ---No Guidelines Guideline Title Affirmed Question Affirmed Notes Nurse Date/Time (Eastern Time) Blood Pressure - High Systolic BP >= 861 OR Diastolic >=  683 Wells, RN, Juliann Pulse 10/19/2019 9:44:41 AM Disp. Time Eilene Ghazi Time) Disposition Final User 10/19/2019 9:41:05 AM Send to Urgent Paula Compton, [place] 10/19/2019 9:46:47 AM See PCP within 24 Hours Yes Rock Nephew, RN, KathyPLEASE NOTE: All timestamps contained within this report are represented as [place] Standard Time. CONFIDENTIALTY NOTICE: This fax transmission is intended only for the addressee. It contains information that is legally privileged, confidential or otherwise protected from use or disclosure. If you are not the intended recipient, you are strictly prohibited from reviewing, disclosing, copying using or disseminating any of this information or taking any action in reliance on or regarding this information. If you have received this fax in error, please notify us immediately by telephone so that we can arrange for its return to [place]. Phone: [phone], Toll-Free: [phone], Fax: [phone] Page: 2 of 2 Call Id: [id number] [place] Disagree/Comply Comply Caller Understands Yes PreDisposition Call Doctor Care Advice Given Per Guideline SEE PCP WITHIN 24 HOURS: * IF OFFICE WILL BE OPEN: You need to be examined within the next 24 hours. Call your doctor (or NP/PA) when the office opens and make an appointment. CALL BACK IF: * You become worse CARE ADVICE given per High Blood Pressure (Adult) guideline. * Weakness or numbness of the face, arm or leg on one side of the body occurs * Difficulty walking, difficulty talking, or severe headache occurs * Chest pain or difficulty breathing occurs Referrals REFERRED TO PCP OFFICE

## 2019-10-19 NOTE — Telephone Encounter (Signed)
Pt contacted Access Nurse this morning. Pt has apt with Dr. Einar Pheasant for tomorrow.   Contacted pt who reports BP is ok now and BP is now 137/80. Pt denies chest pain, SOB, HA, radiating pain, abdominal pain, or back/shoulder pain. Pt does report she has not been sleeping well due to head and ear congestion for 3 days. Pt has been vaccinated and denies any exposure or any other symptoms. Advised pt if she developed any new symptoms to contact office and advised of ER precautions. Pt verbalized understanding.

## 2019-10-19 NOTE — Telephone Encounter (Signed)
Noted. Thanks.

## 2019-10-20 ENCOUNTER — Other Ambulatory Visit: Payer: Self-pay | Admitting: Family Medicine

## 2019-10-20 ENCOUNTER — Other Ambulatory Visit: Payer: Self-pay

## 2019-10-20 ENCOUNTER — Ambulatory Visit (INDEPENDENT_AMBULATORY_CARE_PROVIDER_SITE_OTHER): Payer: Medicare Other | Admitting: Family Medicine

## 2019-10-20 VITALS — BP 131/70 | HR 58 | Temp 96.9°F | Wt 161.8 lb

## 2019-10-20 DIAGNOSIS — I10 Essential (primary) hypertension: Secondary | ICD-10-CM | POA: Diagnosis not present

## 2019-10-20 DIAGNOSIS — Z23 Encounter for immunization: Secondary | ICD-10-CM

## 2019-10-20 DIAGNOSIS — G4700 Insomnia, unspecified: Secondary | ICD-10-CM

## 2019-10-20 LAB — BASIC METABOLIC PANEL
BUN: 16 mg/dL (ref 6–23)
CO2: 30 mEq/L (ref 19–32)
Calcium: 9.6 mg/dL (ref 8.4–10.5)
Chloride: 91 mEq/L — ABNORMAL LOW (ref 96–112)
Creatinine, Ser: 0.96 mg/dL (ref 0.40–1.20)
GFR: 55 mL/min — ABNORMAL LOW (ref >60.00)
Glucose, Bld: 111 mg/dL — ABNORMAL HIGH (ref 70–99)
Potassium: 3.6 mEq/L (ref 3.5–5.1)
Sodium: 129 mEq/L — ABNORMAL LOW (ref 135–145)

## 2019-10-20 LAB — MAGNESIUM: Magnesium: 1.5 mg/dL (ref 1.5–2.5)

## 2019-10-20 MED ORDER — ZOLPIDEM TARTRATE 5 MG PO TABS: 5.0000 mg | ORAL_TABLET | Freq: Every evening | ORAL | 0 refills | Status: DC | PRN

## 2019-10-20 MED ORDER — VALSARTAN 320 MG PO TABS: 320.0000 mg | ORAL_TABLET | Freq: Every day | ORAL | 1 refills | Status: DC

## 2019-10-20 MED ORDER — HYDRALAZINE HCL 50 MG PO TABS: 50.0000 mg | ORAL_TABLET | Freq: Four times a day (QID) | ORAL | 3 refills | Status: DC

## 2019-10-20 NOTE — Assessment & Plan Note (Signed)
Pt reports "no sleep" for one week. Endorses that [place] worked in the past. Short prescription provided due to length of poor sleep and advised that she minimize use and make sure she has a phone nearby and discussed sedation risk. Advised 1/2 tablet to start.

## 2019-10-20 NOTE — Progress Notes (Signed)
Subjective:     Kristine Owens is a 86 y.o. female presenting for Blood Pressure Check (x 1 week  "systolic in the 194'R") and Insomnia (x 1 week )     HPI   #HTN - SBP 200 - HR was elevated in the mornings - will sit for a few minutes at home prior to checking - Valsartan-HCTZ once in the morning - Hydralazine 50 mg TID - wake-up, 8 pm,  - Atenolol 25 mg BID -- but did take 50 mg this morning   #Insomnia - feels like she has not slept in 1 week - does not take melatonin - due to concern is raises bp - will occasionally take a tylenol PM   Review of Systems  09/06/2019: Phone call - BP 170-200 in AM, after medication BP 158. Hydralazine increased to 50 mg TID - cont atenolol, valsartan/HCTZ 10/17/2019: Phone call - BP 170-200  Social History   Tobacco Use  Smoking Status Never Smoker  Smokeless Tobacco Never Used        Objective:    BP Readings from Last 3 Encounters:  10/20/19 131/70  08/16/19 (!) 142/60  04/20/19 140/60   Wt Readings from Last 3 Encounters:  10/20/19 161 lb 12 oz (73.4 kg)  08/16/19 165 lb 9 oz (75.1 kg)  04/20/19 169 lb 5 oz (76.8 kg)    BP 131/70 (BP Location: Left Arm, Patient Position: Sitting, Cuff Size: Normal)   Pulse (!) 58   Temp (!) 96.9 F (36.1 C) (Temporal)   Wt 161 lb 12 oz (73.4 kg)   SpO2 100%   BMI 27.12 kg/m    Physical Exam Constitutional:      General: She is not in acute distress.    Appearance: She is well-developed. She is not diaphoretic.  HENT:     Right Ear: External ear normal.     Left Ear: External ear normal.     Nose: Nose normal.  Eyes:     Conjunctiva/sclera: Conjunctivae normal.  Cardiovascular:     Rate and Rhythm: Normal rate and regular rhythm.     Heart sounds: No murmur heard.   Pulmonary:     Effort: Pulmonary effort is normal. No respiratory distress.     Breath sounds: Normal breath sounds. No wheezing.  Musculoskeletal:     Cervical back: Neck supple.  Skin:     General: Skin is warm and dry.     Capillary Refill: Capillary refill takes less than 2 seconds.  Neurological:     Mental Status: She is alert. Mental status is at baseline.  Psychiatric:        Mood and Affect: Mood normal.        Behavior: Behavior normal.           Assessment & Plan:   Problem List Items Addressed This Visit      Cardiovascular and Mediastinum   Essential hypertension - Primary    Already on 4 agents for BP with relatively low HR. She did take an extra atenolol but due to average HR in 60s per report, advised hydralazine TID > QID dosing. She will schedule follow-up visit in 1 week and cancel if BP is improved on higher dosing.       Relevant Medications   hydrALAZINE (APRESOLINE) 50 MG tablet   Other Relevant Orders   Basic metabolic panel   Magnesium     Other   Insomnia    Pt reports "no sleep" for  one week. Endorses that [place] worked in the past. Short prescription provided due to length of poor sleep and advised that she minimize use and make sure she has a phone nearby and discussed sedation risk. Advised 1/2 tablet to start.       Relevant Medications   zolpidem (AMBIEN) 5 MG tablet    Other Visit Diagnoses    Need for influenza vaccination       Relevant Orders   Flu Vaccine QUAD 36+ mos IM (Completed)       Return in about 1 week (around 10/27/2019).  Lesleigh Noe, MD  This visit occurred during the SARS-CoV-2 public health emergency.  Safety protocols were in place, including screening questions prior to the visit, additional usage of staff PPE, and extensive cleaning of exam room while observing appropriate contact time as indicated for disinfecting solutions.

## 2019-10-20 NOTE — Telephone Encounter (Signed)
Noted. Thanks.

## 2019-10-20 NOTE — Patient Instructions (Addendum)
Hydralazine - 50 mg dose - start taking 4 times daily - 7 am (when you wake up) - 12 noon (lunchtime) - 6-7 pm (Dinnertime) - 10:30 (bedtime)   Sleep - try ambien 5 mg sent to pharmacy - follow-up with Dr. Danise Mina

## 2019-10-20 NOTE — Telephone Encounter (Signed)
See note from today.  Increase hydralazine

## 2019-10-20 NOTE — Assessment & Plan Note (Signed)
Already on 4 agents for BP with relatively low HR. She did take an extra atenolol but due to average HR in 60s per report, advised hydralazine TID > QID dosing. She will schedule follow-up visit in 1 week and cancel if BP is improved on higher dosing.

## 2019-10-28 ENCOUNTER — Encounter: Payer: Self-pay | Admitting: Family Medicine

## 2019-10-28 ENCOUNTER — Other Ambulatory Visit: Payer: Self-pay

## 2019-10-28 ENCOUNTER — Ambulatory Visit (INDEPENDENT_AMBULATORY_CARE_PROVIDER_SITE_OTHER): Payer: Medicare Other | Admitting: Family Medicine

## 2019-10-28 DIAGNOSIS — I10 Essential (primary) hypertension: Secondary | ICD-10-CM | POA: Diagnosis not present

## 2019-10-28 MED ORDER — AMLODIPINE BESYLATE 2.5 MG PO TABS: 2.5000 mg | ORAL_TABLET | Freq: Every day | ORAL | 6 refills | Status: DC

## 2019-10-28 NOTE — Progress Notes (Signed)
This visit was conducted in person.  BP (!) 150/70 (BP Location: Right Arm, Cuff Size: Normal)   Pulse 66   Temp 97.9 F (36.6 C) (Temporal)   Ht 5' 4.75" (1.645 m)   Wt 167 lb 5 oz (75.9 kg)   SpO2 99%   BMI 28.06 kg/m    CC: HTN f/u visit  Subjective:    Patient ID: Kristine Owens, female    DOB: 29-Sep-1930, 86 y.o.   MRN: [id number]  HPI: Kristine Owens is a 86 y.o. female presenting on 10/28/2019 for Hypertension (Here for 1 wk f/u, per Dr. Einar Pheasant. )   See prior note for details.  HTN - Compliant with current antihypertensive regimen of atenolol 25mg  bid, hydralazine 50mg  QID, valsartan 320mg  daily. HCTZ recently stopped due to hyponatremia. Does check blood pressures at home: AM readings 974-163 systolic, daytime readings 845-364W systolic. No low blood pressure readings or symptoms of dizziness/syncope. Denies HA, vision changes, CP/tightness, SOB, leg swelling. Denies diet changes or increased salt/sodium in diet.   Has been sleeping a bit better - only needed ambien once (1/2 dose)     Relevant past medical, surgical, family and social history reviewed and updated as indicated. Interim medical history since our last visit reviewed. Allergies and medications reviewed and updated. Outpatient Medications Prior to Visit  Medication Sig Dispense Refill  . aspirin EC 81 MG tablet Take 1 tablet (81 mg total) by mouth daily.    Marland Kitchen atenolol (TENORMIN) 25 MG tablet TAKE 1 TABLET TWICE DAILY 180 tablet 3  . atorvastatin (LIPITOR) 20 MG tablet Take 1 tablet (20 mg total) by mouth daily. 30 tablet 6  . b complex vitamins tablet Take 1 tablet by mouth daily.      . cholecalciferol (VITAMIN D) 1000 UNITS tablet Take 1,000 Units by mouth daily.      Marland Kitchen Dextromethorphan-Guaifenesin (MUCINEX DM) 30-600 MG TB12 Take 1 tablet by mouth as needed.    . hydrALAZINE (APRESOLINE) 50 MG tablet Take 1 tablet (50 mg total) by mouth 4 (four) times daily. 360 tablet 3  . ibuprofen  (ADVIL,MOTRIN) 200 MG tablet Take 200 mg by mouth every 6 (six) hours as needed.    . Multiple Vitamin (MULTIVITAMIN) tablet Take 1 tablet by mouth daily.      Marland Kitchen omeprazole (PRILOSEC OTC) 20 MG tablet Take 20 mg by mouth as needed.      . valsartan (DIOVAN) 320 MG tablet Take 1 tablet (320 mg total) by mouth daily. 90 tablet 1  . zolpidem (AMBIEN) 5 MG tablet Take 1 tablet (5 mg total) by mouth at bedtime as needed for sleep. 15 tablet 0   No facility-administered medications prior to visit.     Per HPI unless specifically indicated in ROS section below Review of Systems Objective:  BP (!) 150/70 (BP Location: Right Arm, Cuff Size: Normal)   Pulse 66   Temp 97.9 F (36.6 C) (Temporal)   Ht 5' 4.75" (1.645 m)   Wt 167 lb 5 oz (75.9 kg)   SpO2 99%   BMI 28.06 kg/m   Wt Readings from Last 3 Encounters:  10/28/19 167 lb 5 oz (75.9 kg)  10/20/19 161 lb 12 oz (73.4 kg)  08/16/19 165 lb 9 oz (75.1 kg)      Physical Exam Vitals and nursing note reviewed.  Constitutional:      Appearance: Normal appearance. She is not ill-appearing.  Neck:     Thyroid: No thyroid mass or  thyromegaly.  Cardiovascular:     Rate and Rhythm: Normal rate and regular rhythm.     Pulses: Normal pulses.     Heart sounds: No murmur heard.   Pulmonary:     Effort: Pulmonary effort is normal. No respiratory distress.     Breath sounds: Normal breath sounds. No wheezing, rhonchi or rales.  Musculoskeletal:     Right lower leg: No edema.     Left lower leg: No edema.  Neurological:     Mental Status: She is alert.  Psychiatric:        Mood and Affect: Mood normal.        Behavior: Behavior normal.       Results for orders placed or performed in visit on 76/54/65  Basic metabolic panel  Result Value Ref Range   Sodium 129 (L) 135 - 145 mEq/L   Potassium 3.6 3.5 - 5.1 mEq/L   Chloride 91 (L) 96 - 112 mEq/L   CO2 30 19 - 32 mEq/L   Glucose, Bld 111 (H) 70 - 99 mg/dL   BUN 16 6 - 23 mg/dL    Creatinine, Ser 0.96 0.40 - 1.20 mg/dL   GFR 55.00 (L) >60.00 mL/min   Calcium 9.6 8.4 - 10.5 mg/dL  Magnesium  Result Value Ref Range   Magnesium 1.5 1.5 - 2.5 mg/dL   Assessment & Plan:  This visit occurred during the SARS-CoV-2 public health emergency.  Safety protocols were in place, including screening questions prior to the visit, additional usage of staff PPE, and extensive cleaning of exam room while observing appropriate contact time as indicated for disinfecting solutions.   Problem List Items Addressed This Visit    Essential hypertension    Chronic, remains uncontrolled despite increase in hydralazine to 50mg  QID. Notes AM readings are the highest. Will add amlodipine 2.5mg  at night time (previously 5mg  dose caused ankle swelling). RTC 1 mo f/u visit. Encouraged increased water intake (can do better with this).       Relevant Medications   amLODipine (NORVASC) 2.5 MG tablet       Meds ordered this encounter  Medications  . amLODipine (NORVASC) 2.5 MG tablet    Sig: Take 1 tablet (2.5 mg total) by mouth at bedtime.    Dispense:  30 tablet    Refill:  6   No orders of the defined types were placed in this encounter.   Patient Instructions  Blood pressures are staying high in the mornings.  Start amlodipine 2.5mg  at night time, continue other medicines.  Return in 1 month for follow up visit, let us know sooner if any concerns.    Follow up plan: Return in about 4 weeks (around 11/25/2019) for follow up visit.  Ria Bush, MD

## 2019-10-28 NOTE — Patient Instructions (Signed)
Blood pressures are staying high in the mornings.  Start amlodipine 2.5mg  at night time, continue other medicines.  Return in 1 month for follow up visit, let us know sooner if any concerns.

## 2019-10-28 NOTE — Assessment & Plan Note (Signed)
Chronic, remains uncontrolled despite increase in hydralazine to 50mg  QID. Notes AM readings are the highest. Will add amlodipine 2.5mg  at night time (previously 5mg  dose caused ankle swelling). RTC 1 mo f/u visit. Encouraged increased water intake (can do better with this).

## 2019-10-31 ENCOUNTER — Emergency Department (HOSPITAL_COMMUNITY)
Admission: EM | Admit: 2019-10-31 | Discharge: 2019-10-31 | Disposition: A | Discharge status: Home / Self Care | Payer: Medicare Other | Attending: Emergency Medicine | Admitting: Emergency Medicine

## 2019-10-31 ENCOUNTER — Encounter (HOSPITAL_COMMUNITY): Payer: Self-pay

## 2019-10-31 ENCOUNTER — Other Ambulatory Visit: Payer: Self-pay

## 2019-10-31 ENCOUNTER — Telehealth: Payer: Self-pay | Admitting: Family Medicine

## 2019-10-31 DIAGNOSIS — I10 Essential (primary) hypertension: Secondary | ICD-10-CM | POA: Diagnosis not present

## 2019-10-31 DIAGNOSIS — R030 Elevated blood-pressure reading, without diagnosis of hypertension: Secondary | ICD-10-CM | POA: Diagnosis present

## 2019-10-31 DIAGNOSIS — I160 Hypertensive urgency: Secondary | ICD-10-CM | POA: Insufficient documentation

## 2019-10-31 DIAGNOSIS — Z7982 Long term (current) use of aspirin: Secondary | ICD-10-CM | POA: Diagnosis not present

## 2019-10-31 DIAGNOSIS — R7303 Prediabetes: Secondary | ICD-10-CM | POA: Insufficient documentation

## 2019-10-31 DIAGNOSIS — Z79899 Other long term (current) drug therapy: Secondary | ICD-10-CM | POA: Insufficient documentation

## 2019-10-31 LAB — BASIC METABOLIC PANEL
Anion gap: 11 (ref 5–15)
BUN: 5 mg/dL — ABNORMAL LOW (ref 8–23)
CO2: 24 mmol/L (ref 22–32)
Calcium: 9.3 mg/dL (ref 8.9–10.3)
Chloride: 96 mmol/L — ABNORMAL LOW (ref 98–111)
Creatinine, Ser: 0.8 mg/dL (ref 0.44–1.00)
GFR calc Af Amer: >60 mL/min (ref >60)
GFR calc non Af Amer: >60 mL/min (ref >60)
Glucose, Bld: 99 mg/dL (ref 70–99)
Potassium: 4.1 mmol/L (ref 3.5–5.1)
Sodium: 131 mmol/L — ABNORMAL LOW (ref 135–145)

## 2019-10-31 LAB — CBC WITH DIFFERENTIAL/PLATELET
Abs Immature Granulocytes: 0.02 10*3/uL (ref 0.00–0.07)
Basophils Absolute: 0 10*3/uL (ref 0.0–0.1)
Basophils Relative: 1 %
Eosinophils Absolute: 0 10*3/uL (ref 0.0–0.5)
Eosinophils Relative: 0 %
HCT: 40.1 % (ref 36.0–46.0)
Hemoglobin: 13.3 g/dL (ref 12.0–15.0)
Immature Granulocytes: 0 %
Lymphocytes Relative: 13 %
Lymphs Abs: 0.9 10*3/uL (ref 0.7–4.0)
MCH: 30.4 pg (ref 26.0–34.0)
MCHC: 33.2 g/dL (ref 30.0–36.0)
MCV: 91.8 fL (ref 80.0–100.0)
Monocytes Absolute: 0.5 10*3/uL (ref 0.1–1.0)
Monocytes Relative: 8 %
Neutro Abs: 5.1 10*3/uL (ref 1.7–7.7)
Neutrophils Relative %: 78 %
Platelets: 330 10*3/uL (ref 150–400)
RBC: 4.37 MIL/uL (ref 3.87–5.11)
RDW: 13.2 % (ref 11.5–15.5)
WBC: 6.6 10*3/uL (ref 4.0–10.5)
nRBC: 0 % (ref 0.0–0.2)

## 2019-10-31 MED ORDER — AMLODIPINE BESYLATE 2.5 MG PO TABS: 2.5000 mg | ORAL_TABLET | Freq: Every day | ORAL | 3 refills | Status: DC

## 2019-10-31 MED ORDER — AMLODIPINE BESYLATE 5 MG PO TABS
2.5000 mg | ORAL_TABLET | Freq: Once | ORAL | Status: AC
  Administered 2019-10-31: 2.5 mg via ORAL
  Filled 2019-10-31: qty 1

## 2019-10-31 MED ORDER — SODIUM CHLORIDE 0.9 % IV BOLUS
1000.0000 mL | Freq: Once | INTRAVENOUS | Status: AC
  Administered 2019-10-31: 1000 mL via INTRAVENOUS

## 2019-10-31 NOTE — ED Triage Notes (Signed)
Pt BIB [facility] for symptomatic hypertension x3 days. Pt saw her PCP who added amlodipine to regimen of atenolol, hydralazine, and valsartan but amlodipine has not been called in. Pt reported feeling pressure, intermittent blurred vision, and "unsteadiness"  AOX4, denies chest pain and SOB. No other medical PMH

## 2019-10-31 NOTE — Telephone Encounter (Signed)
Noted  

## 2019-10-31 NOTE — Telephone Encounter (Signed)
Spoke with Dr Regenia Skeeter at [facility]. Pt seen today for symptomatic hypertension.  Amlodipine was sent to mail order. I have sent local amlodipine 2.5mg  to take nightly. EDP will give patient dose of amlodipine 2.5mg  at ER and she may start at night time.  plz call tomorrow morning for update on BP readings. If not improving, next step is to consider referral to cards for HTN management assistance.

## 2019-10-31 NOTE — Telephone Encounter (Signed)
[facility] RECORD [facility] Patient Name: Kristine Owens Gender: Female DOB: October 03, 1930 Age: 86 Y 10 M 8 D Return Phone Number: [phone] (Primary), [phone] (Secondary) Address[place] [address] Client [facility] Night - Client Client Site [facility] Physician Ria Bush - MD Contact Type Call Who Is Calling Patient / Member / Family / Caregiver Call Type Triage / Clinical Relationship To Patient Self Return Phone Number [phone] (Primary) Chief Complaint Blood Pressure High Reason for Call Symptomatic / Request for [name] says her bp is high 189/79 and the pharmacy did not receive request for prescription from the office Translation No Nurse Assessment Nurse: Hardin Negus, RN, Mardene Celeste Date/Time Eilene Ghazi Time): 10/29/2019 2:53:10 PM Confirm and document reason for call. If symptomatic, describe symptoms. ---Herr bp is high 189/79 and the pharmacy did not receive request for prescription from the office. She was seen in the office on Friday and a new medication was suppose to be called in to the pharmacy. At times she feels light headed and tired. no fever Does the patient have any new or worsening symptoms? ---Yes Will a triage be completed? ---Yes Related visit to physician within the last 2 weeks? ---Yes Does the PT have any chronic conditions? (i.e. diabetes, asthma, this includes High risk factors for pregnancy, etc.) ---Yes List chronic conditions. ---hypertension Is this a behavioral health or substance abuse call? ---No Guidelines Guideline Title Affirmed Question Affirmed Notes Nurse Date/Time (Eastern Time) Blood Pressure - High [6] Systolic BP >= 468 OR Diastolic >= 032 AND [1] cardiac or neurologic symptoms (e.g., chest pain, difficulty breathing, unsteady gait, blurred  vision) Oren Bracket 10/29/2019 2:56:34 PM Disp. Time Eilene Ghazi Time) Disposition Final User 10/29/2019 3:02:12 PM Go to ED Now Yes Hardin Negus, RN, PatriciaPLEASE NOTE: All timestamps contained within this report are represented as [place] Standard Time. CONFIDENTIALTY NOTICE: This fax transmission is intended only for the addressee. It contains information that is legally privileged, confidential or otherwise protected from use or disclosure. If you are not the intended recipient, you are strictly prohibited from reviewing, disclosing, copying using or disseminating any of this information or taking any action in reliance on or regarding this information. If you have received this fax in error, please notify us immediately by telephone so that we can arrange for its return to [place]. Phone: [phone], Toll-Free: [phone], Fax: [phone] Page: 2 of 2 Call Id: [id number] [place] Disagree/Comply Disagree Caller Understands Yes PreDisposition Call Doctor Care Advice Given Per Guideline GO TO ED NOW: CARE ADVICE given per High Blood Pressure (Adult) guideline. Comments User: Ledora Bottcher, RN Date/Time Eilene Ghazi Time): 10/29/2019 2:56:28 PM She too her valsartan today, hydralazine , metroprol Referrals GO TO FACILITY UNDECIDED GO TO FACILITY REFUSED

## 2019-10-31 NOTE — ED Provider Notes (Signed)
[facility] EMERGENCY DEPARTMENT Provider Note   CSN: [id number] Arrival date & time: 10/31/19  1022     History No chief complaint on file.   Kristine Owens is a 86 y.o. female.  HPI 86 year old female presents with uncontrolled hypertension.  For the past 3 days or so her blood pressure has been consistently over 200.  Typically her blood pressure is worst first thing in the morning but now it is elevated all day.  She has felt some lightheadedness and feeling off.  A little bit of head pressure that typically happens whenever she has high blood pressure.  She has been compliant with her meds which have not been changed in a while until she saw her doctor 3 days ago.  She was to be prescribed Norvasc at night but the prescription never came in.  She also does not want to start this as she has had ankle swelling from it before.  No thunderclap headache, chest pain, vision changes, or focal weakness/speech difficulty. Took all of her morning meds this morning.   Past Medical History:  Diagnosis Date  . ERM OD (epiretinal membrane, right eye) 2015   also with R choroidal nevus; referred to Dr. Zigmund Daniel  . GERD (gastroesophageal reflux disease) 02/2000  . Hyperlipemia 07/2000  . Hypertension 01/2001  . Hypertensive retinopathy of both eyes    [facility]    Patient Active Problem List   Diagnosis Date Noted  . Open angle with borderline glaucoma findings, bilateral 04/25/2019  . Hypertensive retinopathy of both eyes 04/25/2019  . Arm paresthesia, left 04/06/2019  . Hearing loss 01/29/2017  . Insomnia 10/20/2014  . Advanced care planning/counseling discussion 12/23/2013  . Medicare annual wellness visit, subsequent 12/23/2011  . Prediabetes 12/03/2006  . HLD (hyperlipidemia) 09/02/2006  . Essential hypertension 09/02/2006  . GERD 09/02/2006  . POSTMENOPAUSAL STATUS 09/02/2006  . ALLERGY 09/02/2006    Past Surgical History:  Procedure Laterality Date  .  CATARACT EXTRACTION Bilateral 06/2016   [name]  . TONSILLECTOMY    . TOTAL ABDOMINAL HYSTERECTOMY  1981   w/BSO for fibroids (Dr. Jerene Pitch)     OB History   No obstetric history on file.     Family History  Problem Relation Age of Onset  . Hypertension Mother   . Heart disease Father        MI, CVD  . Kidney disease Father        dialysis  . Hypertension Father   . Cancer Brother        lung (smoker)  . Aneurysm Brother        of aorta X 2  . Cancer Maternal Aunt        3 aunts (breast, pancreatic)  . Breast cancer Maternal Aunt     Social History   Tobacco Use  . Smoking status: Never Smoker  . Smokeless tobacco: Never Used  Vaping Use  . Vaping Use: Never used  Substance Use Topics  . Alcohol use: Yes    Alcohol/week: 14.0 standard drinks    Types: 14 Glasses of wine per week    Comment: red wine several times a week  . Drug use: No    Home Medications Prior to Admission medications   Medication Sig Start Date End Date Taking? Authorizing Provider  amLODipine (NORVASC) 2.5 MG tablet Take 1 tablet (2.5 mg total) by mouth at bedtime. 10/31/19   Ria Bush, MD  aspirin EC 81 MG tablet Take 1 tablet (81  mg total) by mouth daily. 04/06/19   Ria Bush, MD  atenolol (TENORMIN) 25 MG tablet TAKE 1 TABLET TWICE DAILY 01/12/19   Ria Bush, MD  atorvastatin (LIPITOR) 20 MG tablet Take 1 tablet (20 mg total) by mouth daily. 04/06/19   Ria Bush, MD  b complex vitamins tablet Take 1 tablet by mouth daily.      [provider]  cholecalciferol (VITAMIN D) 1000 UNITS tablet Take 1,000 Units by mouth daily.      [provider]  Dextromethorphan-Guaifenesin (MUCINEX DM) 30-600 MG TB12 Take 1 tablet by mouth as needed.    [provider]  hydrALAZINE (APRESOLINE) 50 MG tablet Take 1 tablet (50 mg total) by mouth 4 (four) times daily. 10/20/19   Lesleigh Noe, MD  ibuprofen (ADVIL,MOTRIN) 200 MG tablet Take 200 mg by mouth every  6 (six) hours as needed.    [provider]  Multiple Vitamin (MULTIVITAMIN) tablet Take 1 tablet by mouth daily.      [provider]  omeprazole (PRILOSEC OTC) 20 MG tablet Take 20 mg by mouth as needed.      [provider]  valsartan (DIOVAN) 320 MG tablet Take 1 tablet (320 mg total) by mouth daily. 10/20/19   Lesleigh Noe, MD  zolpidem (AMBIEN) 5 MG tablet Take 1 tablet (5 mg total) by mouth at bedtime as needed for sleep. 10/20/19   Lesleigh Noe, MD    Allergies    Amlodipine, Codeine sulfate, Maxzide [hydrochlorothiazide w-triamterene], and Trazodone and nefazodone  Review of Systems   Review of Systems  Eyes: Negative for visual disturbance.  Cardiovascular: Negative for chest pain.  Neurological: Positive for light-headedness and headaches. Negative for speech difficulty and weakness.  All other systems reviewed and are negative.   Physical Exam Updated Vital Signs BP (!) 181/67   Pulse 64   Temp 97.9 F (36.6 C) (Oral)   Resp 12   Ht 5\' 5"  (1.651 m)   Wt 73 kg   SpO2 100%   BMI 26.79 kg/m   Physical Exam Vitals and nursing note reviewed.  Constitutional:      Appearance: She is well-developed.  HENT:     Head: Normocephalic and atraumatic.     Right Ear: External ear normal.     Left Ear: External ear normal.     Nose: Nose normal.  Eyes:     General:        Right eye: No discharge.        Left eye: No discharge.     Extraocular Movements: Extraocular movements intact.     Pupils: Pupils are equal, round, and reactive to light.  Cardiovascular:     Rate and Rhythm: Normal rate and regular rhythm.     Heart sounds: Normal heart sounds.  Pulmonary:     Effort: Pulmonary effort is normal.     Breath sounds: Normal breath sounds.  Abdominal:     Palpations: Abdomen is soft.     Tenderness: There is no abdominal tenderness.  Skin:    General: Skin is warm and dry.  Neurological:     Mental Status: She is alert.      Comments: CN 3-12 grossly intact. 5/5 strength in all 4 extremities. Grossly normal sensation. Normal finger to nose.   Psychiatric:        Mood and Affect: Mood is not anxious.     ED Results / Procedures / Treatments   Labs (all labs  ordered are listed, but only abnormal results are displayed) Labs Reviewed  BASIC METABOLIC PANEL - Abnormal; Notable for the following components:      Result Value   Sodium 131 (*)    Chloride 96 (*)    BUN 5 (*)    All other components within normal limits  CBC WITH DIFFERENTIAL/PLATELET    EKG EKG Interpretation  Date/Time:  Monday October 31 2019 10:33:09 EDT Ventricular Rate:  76 PR Interval:    QRS Duration: 82 QT Interval:  408 QTC Calculation: 459 R Axis:   54 Text Interpretation: Sinus rhythm Probable left atrial enlargement No old tracing to compare Confirmed by Sherwood Gambler [phone]) on 10/31/2019 10:54:59 AM   Radiology No results found.  Procedures Procedures (including critical care time)  Medications Ordered in ED Medications  amLODipine (NORVASC) tablet 2.5 mg (has no administration in time range)  sodium chloride 0.9 % bolus 1,000 mL (0 mLs Intravenous Stopped 10/31/19 1308)    ED Course  I have reviewed the triage vital signs and the nursing notes.  Pertinent labs & imaging results that were available during my care of the patient were reviewed by me and considered in my medical decision making (see chart for details).    MDM Rules/Calculators/A&P                          Patient's blood pressure has trended down and she has no current symptoms.  No signs/symptoms of hypertensive emergency.  I discussed with her PCP, Dr. Danise Mina, who really think she needs to go on the Norvasc as she is running out of other options.  Patient agrees to start the Norvasc and her original prescription was sent to the mail order and so he will prescribe it to her to her local pharmacy.  Give her a dose now.  He will check on her  tomorrow.  Final Clinical Impression(s) / ED Diagnoses Final diagnoses:  Hypertensive urgency    Rx / DC Orders ED Discharge Orders    None       Sherwood Gambler, MD 10/31/19 1326

## 2019-11-01 NOTE — Telephone Encounter (Signed)
Cardiology referral placed.  Recommend continue amlodipine 2.5mg  at night time and updated [place] with AM readings in a few days.

## 2019-11-01 NOTE — Addendum Note (Signed)
Addended by: Ria Bush on: 11/01/2019 03:31 PM   Modules accepted: Orders

## 2019-11-01 NOTE — Telephone Encounter (Signed)
Left message on vm per dpr relaying Dr. G's message.  

## 2019-11-01 NOTE — Telephone Encounter (Signed)
Spoke with pt asking for update on BP readings.  Says systolic BP this morning was 198.  BP was 177/60 about 2 hrs ago.  Pt states she feels a lot better.  I relayed Dr. Synthia Innocent message.  Pt verbalizes understanding and agrees to cards referral.

## 2019-11-02 NOTE — Telephone Encounter (Signed)
Pt confirmed receiving message.  Verbalizes understanding.

## 2019-11-10 NOTE — Telephone Encounter (Signed)
Pt calling with recent AM BP readings.   9/30- 5:30 AM 197/65 10/1- 6:30 AM  166/64 10/2- 7:10 AM  178/60 10/3- 6:30 AM 180/61 10/4- 5:50 AM 190/64 10/5- 7:30 AM  171/65 10/6- 7:30 AM 186/73    Has appt with cards 10/14/2.1

## 2019-11-11 MED ORDER — FUROSEMIDE 20 MG PO TABS: 10.0000 mg | ORAL_TABLET | Freq: Every day | ORAL | 3 refills | Status: DC

## 2019-11-11 NOTE — Telephone Encounter (Addendum)
AM readings remain uncontrolled despite: PM amlodipine 2.5mg  daily (titration limited by pedal edema) Atenolol 25mg  BID (titration limited by bradycardia) Hydralazine 50mg  QID Diovan (valsartan) 320mg  daily  Avoiding thiazide due to hyponatremia.  Recommend we add lasix 10-20mg  daily in AM - this is a water pill.  Start at 10mg  (1/2 tab) to see how she tolerates this.  Keep cardiology appt.  Update [place] Monday with readings over the weekend.

## 2019-11-11 NOTE — Addendum Note (Signed)
Addended by: Ria Bush on: 11/11/2019 07:26 AM   Modules accepted: Orders

## 2019-11-11 NOTE — Telephone Encounter (Signed)
Spoke with pt relaying Dr. G's message. Pt verbalizes understanding.  

## 2019-11-14 NOTE — Telephone Encounter (Signed)
Pt returning call with the following BP readings:   10/9- 5:00 AM 195/82  10/10- 6:00 AM 206/ 73 10/11- 5:15 AM 196/85     Pt also wanted to make Dr. Darnell Level aware that she took Lasix 1/2 20 mg tab on 10/9 and 10/10 and both days felt a little lightheaded.  Pt is aware that Dr. Darnell Level is out of the office today.

## 2019-11-14 NOTE — Telephone Encounter (Signed)
Would she be willing to try amlodipine 5mg  at night time? Would need to monitor ankle swelling.

## 2019-11-14 NOTE — Telephone Encounter (Signed)
Lvm asking pt to call back.  Need to relay Dr. G's message.  

## 2019-11-15 MED ORDER — AMLODIPINE BESYLATE 5 MG PO TABS: 5.0000 mg | ORAL_TABLET | Freq: Every day | ORAL | 3 refills | Status: DC

## 2019-11-15 NOTE — Telephone Encounter (Signed)
Spoke with pt relaying Dr. Synthia Innocent message.  Pt agrees to try amlodipine 5 mg at night.

## 2019-11-15 NOTE — Telephone Encounter (Signed)
Pt left v/m returning Lisa's call and request cb.

## 2019-11-15 NOTE — Addendum Note (Signed)
Addended by: Ria Bush on: 11/15/2019 04:51 PM   Modules accepted: Orders

## 2019-11-15 NOTE — Telephone Encounter (Signed)
New 5mg  dose at pharmacy.  Double up on what she has of 2.5mg  until she runs out.

## 2019-11-15 NOTE — Telephone Encounter (Signed)
Left message on vm per dpr relaying Dr. G's message.  

## 2019-11-15 NOTE — Telephone Encounter (Signed)
Lvm asking pt to call back.  Need to relay Dr. G's message.  

## 2019-11-15 NOTE — Telephone Encounter (Signed)
Pt returning call and request cb.

## 2019-11-16 NOTE — Telephone Encounter (Signed)
Left message on vm per dpr relaying Dr. G's message.  

## 2019-11-17 ENCOUNTER — Encounter: Payer: Self-pay | Admitting: Cardiology

## 2019-11-17 ENCOUNTER — Other Ambulatory Visit: Payer: Self-pay

## 2019-11-17 ENCOUNTER — Ambulatory Visit (INDEPENDENT_AMBULATORY_CARE_PROVIDER_SITE_OTHER): Payer: Medicare Other | Admitting: Cardiology

## 2019-11-17 VITALS — BP 140/64 | HR 65 | Ht 65.0 in | Wt 159.0 lb

## 2019-11-17 DIAGNOSIS — I10 Essential (primary) hypertension: Secondary | ICD-10-CM

## 2019-11-17 DIAGNOSIS — E7800 Pure hypercholesterolemia, unspecified: Secondary | ICD-10-CM

## 2019-11-17 MED ORDER — CARVEDILOL 25 MG PO TABS: 25.0000 mg | ORAL_TABLET | Freq: Two times a day (BID) | ORAL | 3 refills | Status: DC

## 2019-11-17 MED ORDER — HYDRALAZINE HCL 50 MG PO TABS: 50.0000 mg | ORAL_TABLET | Freq: Three times a day (TID) | ORAL | 3 refills | Status: DC

## 2019-11-17 MED ORDER — HYDROCHLOROTHIAZIDE 25 MG PO TABS: 25.0000 mg | ORAL_TABLET | Freq: Every day | ORAL | 3 refills | Status: DC

## 2019-11-17 NOTE — Telephone Encounter (Signed)
Left message on vm per dpr relaying Dr. G's message.  

## 2019-11-17 NOTE — Progress Notes (Signed)
Cardiology Office Note:    Date:  11/17/2019   ID:  [name], DOB [birth year], MRN [id number]  PCP:  Ria Bush, MD  [facility] Cardiologist:  Kate Sable, MD  [place] Electrophysiologist:  None   Referring MD: Ria Bush, MD   Chief Complaint  Patient presents with  . New Patient (Initial Visit)    Establish care with provider for essential hypertension. Medications verbally reviewed with patient.     History of Present Illness:    Kristine Owens is a 86 y.o. female with a hx of hypertension, hyperlipidemia who presents due to difficult to control blood pressures.  Patient diagnosed with hypertension for years now.  She has been on multiple medications, over the past several months, blood pressures have been elevated.  As high as 200s.  She takes a lot of blood pressure medications, his concerns as to the number of pills he has to take.  She states a number of medications/pill she takes is affecting her life.  Denies any history of heart disease.  Watches her more salt she eats.  Recently started on amlodipine 5 mg daily which she is tolerating.  Previously had edema on this medication at a higher dose.  She was prescribed furosemide which she took for 2 days, but stopped due to dizziness.  Denies chest pain, shortness of breath, palpitations.  Denies smoking.  Past Medical History:  Diagnosis Date  . ERM OD (epiretinal membrane, right eye) 2015   also with R choroidal nevus; referred to Dr. Zigmund Daniel  . GERD (gastroesophageal reflux disease) 02/2000  . Hyperlipemia 07/2000  . Hypertension 01/2001  . Hypertensive retinopathy of both eyes    [facility]    Past Surgical History:  Procedure Laterality Date  . CATARACT EXTRACTION Bilateral 06/2016   [name]  . TONSILLECTOMY    . TOTAL ABDOMINAL HYSTERECTOMY  1981   w/BSO for fibroids (Dr. Jerene Pitch)    Current Medications: Current Meds  Medication Sig  . amLODipine (NORVASC) 5 MG tablet  Take 1 tablet (5 mg total) by mouth at bedtime.  Marland Kitchen aspirin EC 81 MG tablet Take 1 tablet (81 mg total) by mouth daily.  Marland Kitchen atorvastatin (LIPITOR) 20 MG tablet Take 1 tablet (20 mg total) by mouth daily.  Marland Kitchen b complex vitamins tablet Take 1 tablet by mouth daily.    . cholecalciferol (VITAMIN D) 1000 UNITS tablet Take 1,000 Units by mouth daily.    Marland Kitchen Dextromethorphan-Guaifenesin (MUCINEX DM) 30-600 MG TB12 Take 1 tablet by mouth as needed.  Marland Kitchen ibuprofen (ADVIL,MOTRIN) 200 MG tablet Take 200 mg by mouth every 6 (six) hours as needed.  . Multiple Vitamin (MULTIVITAMIN) tablet Take 1 tablet by mouth daily.    Marland Kitchen omeprazole (PRILOSEC OTC) 20 MG tablet Take 20 mg by mouth as needed.    . valsartan (DIOVAN) 320 MG tablet Take 1 tablet (320 mg total) by mouth daily.  Marland Kitchen zolpidem (AMBIEN) 5 MG tablet Take 1 tablet (5 mg total) by mouth at bedtime as needed for sleep.  . [DISCONTINUED] atenolol (TENORMIN) 25 MG tablet TAKE 1 TABLET TWICE DAILY  . [DISCONTINUED] furosemide (LASIX) 20 MG tablet Take 0.5 tablets (10 mg total) by mouth daily.  . [DISCONTINUED] hydrALAZINE (APRESOLINE) 50 MG tablet Take 1 tablet (50 mg total) by mouth 4 (four) times daily.     Allergies:   Amlodipine, Codeine sulfate, Maxzide [hydrochlorothiazide w-triamterene], and Trazodone and nefazodone   Social History   Socioeconomic History  . Marital status: Widowed  Spouse name: Not on file  . Number of children: 1  . Years of education: Not on file  . Highest education level: Not on file  Occupational History  . Occupation: Studio//Artist//Teaches    Employer: RETIRED  Tobacco Use  . Smoking status: Never Smoker  . Smokeless tobacco: Never Used  Vaping Use  . Vaping Use: Never used  Substance and Sexual Activity  . Alcohol use: Yes    Alcohol/week: 14.0 standard drinks    Types: 14 Glasses of wine per week    Comment: red wine several times a week  . Drug use: No  . Sexual activity: Yes  Other Topics Concern  . 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 Difficulty of Paying Living Expenses: Not hard at all  Food Insecurity: No Food Insecurity  . Worried About Charity fundraiser in the Last Year: Never true  . Ran Out of Food in the Last Year: Never true  Transportation Needs: No Transportation Needs  . Lack of Transportation (Medical): No  . Lack of Transportation (Non-Medical): No  Physical Activity: Inactive  . Days of Exercise per Week: 0 days  . Minutes of Exercise per Session: 0 min  Stress: No Stress Concern Present  . Feeling of Stress : Not at all  Social Connections:   . Frequency of Communication with Friends and Family: Not on file  . Frequency of Social Gatherings with Friends and Family: Not on file  . Attends Religious Services: Not on file  . Active Member of Clubs or Organizations: Not on file  . Attends Archivist Meetings: Not on file  . Marital Status: Not on file     Family History: The patient's family history includes Aneurysm in her brother; Breast cancer in her maternal aunt; Cancer in her brother and maternal aunt; Heart disease in her father; Hypertension in her father and mother; Kidney disease in her father.  ROS:   Please see the history of present illness.     All other systems reviewed and are negative.  EKGs/Labs/Other Studies Reviewed:    The following studies were reviewed today:   EKG:  EKG is  ordered today.  The ekg ordered today demonstrates normal sinus rhythm, nonspecific ST changes.  Recent  Labs: 02/02/2019: ALT 28 04/06/2019: TSH 1.98 10/20/2019: Magnesium 1.5 10/31/2019: BUN 5; Creatinine, Ser 0.80; Hemoglobin 13.3; Platelets 330; Potassium 4.1; Sodium 131  Recent Lipid Panel    Component Value Date/Time   CHOL 217 (H) 02/02/2019 0845   TRIG 94.0 02/02/2019 0845   HDL 77.90 02/02/2019 0845   CHOLHDL 3 02/02/2019 0845   VLDL 18.8 02/02/2019 0845   LDLCALC 120 (H) 02/02/2019 0845   LDLDIRECT 121.3 12/16/2011 0837     Risk Assessment/Calculations:      Physical Exam:    VS:  BP 140/64 (BP Location: Right Arm, Patient Position: Sitting, Cuff Size: Normal)   Pulse 65   Ht 5\' 5"  (1.651 m)   Wt 159 lb (72.1 kg)   SpO2  97%   BMI 26.46 kg/m     Wt Readings from Last 3 Encounters:  11/17/19 159 lb (72.1 kg)  10/31/19 161 lb (73 kg)  10/28/19 167 lb 5 oz (75.9 kg)     GEN:  Well nourished, well developed in no acute distress HEENT: Normal NECK: No JVD; No carotid bruits LYMPHATICS: No lymphadenopathy CARDIAC: RRR, no murmurs, rubs, gallops RESPIRATORY:  Clear to auscultation without rales, wheezing or rhonchi  ABDOMEN: Soft, non-tender, non-distended MUSCULOSKELETAL:  No edema; No deformity  SKIN: Warm and dry NEUROLOGIC:  Alert and oriented x 3 PSYCHIATRIC:  Normal affect   ASSESSMENT:    1. Uncontrolled hypertension   2. Pure hypercholesterolemia   3. Essential hypertension    PLAN:    In order of problems listed above:  1. Patient with uncontrolled hypertension, currently on a lot of pills.  We will try to optimize his current medications and hopefully decrease the total number of pills she has to take.  Looking at her medications, not on a diuretic.  Stop atenolol, stop Lasix.  Decrease hydralazine to 50 mg 3 times daily.  Start HCTZ 25 mg daily, start Coreg 25 mg twice daily.  Continue Diovan and Norvasc as prescribed for now.  If blood pressure improves, plan to titrate down hydralazine further.  Advised to check blood pressure frequently at  home. 2. History of hyperlipidemia, continue Lipitor as prescribed.  Follow-up with myself in 2 to 3 weeks.  Total encounter time 60 minutes  Greater than 50% was spent in counseling and coordination of care with the patient    Medication Adjustments/Labs and Tests Ordered: Current medicines are reviewed at length with the patient today.  Concerns regarding medicines are outlined above.  Orders Placed This Encounter  Procedures  . EKG 12-Lead   Meds ordered this encounter  Medications  . hydrALAZINE (APRESOLINE) 50 MG tablet    Sig: Take 1 tablet (50 mg total) by mouth 3 (three) times daily.    Dispense:  270 tablet    Refill:  3  . hydrochlorothiazide (HYDRODIURIL) 25 MG tablet    Sig: Take 1 tablet (25 mg total) by mouth daily.    Dispense:  90 tablet    Refill:  3  . carvedilol (COREG) 25 MG tablet    Sig: Take 1 tablet (25 mg total) by mouth 2 (two) times daily.    Dispense:  180 tablet    Refill:  3    Patient Instructions  Medication Instructions:  Your physician has recommended you make the following change in your medication:  1. STOP Furosemide (Lasix) 2. STOP Atenolol  3. DECREASE Hydralazine 50 mg three times daily  4. START Hydrochlorothiazide 25 mg once daily 5. START Carvedilol 25 mg twice a day   *If you need a refill on your cardiac medications before your next appointment, please call your pharmacy*   Lab Work: None If you have labs (blood work) drawn today and your tests are completely normal, you will receive your results only by: Marland Kitchen MyChart Message (if you have MyChart) OR . A paper copy in the mail If you have any lab test that is abnormal or we need to change your treatment, we will call you to review the results.   Testing/Procedures: None   Follow-Up: At [facility], you and your health needs are our priority.  As part of our continuing mission to provide you with exceptional heart care, we have created designated Provider Care Teams.  These Care Teams include your primary Cardiologist (physician) and Advanced Practice Providers (APPs -  Physician Assistants and Nurse Practitioners) who all work together to provide you with the care you need, when you need it.  Your next appointment:   3 week(s)  The format for your next appointment:   In Person  Provider:   Kate Sable, MD      Signed, Kate Sable, MD  11/17/2019 1:00 PM    [place]

## 2019-11-17 NOTE — Patient Instructions (Addendum)
Medication Instructions:  Your physician has recommended you make the following change in your medication:  1. STOP Furosemide (Lasix) 2. STOP Atenolol  3. DECREASE Hydralazine 50 mg three times daily  4. START Hydrochlorothiazide 25 mg once daily 5. START Carvedilol 25 mg twice a day   *If you need a refill on your cardiac medications before your next appointment, please call your pharmacy*   Lab Work: None If you have labs (blood work) drawn today and your tests are completely normal, you will receive your results only by: Marland Kitchen MyChart Message (if you have MyChart) OR . A paper copy in the mail If you have any lab test that is abnormal or we need to change your treatment, we will call you to review the results.   Testing/Procedures: None   Follow-Up: At [facility], you and your health needs are our priority.  As part of our continuing mission to provide you with exceptional heart care, we have created designated Provider Care Teams.  These Care Teams include your primary Cardiologist (physician) and Advanced Practice Providers (APPs -  Physician Assistants and Nurse Practitioners) who all work together to provide you with the care you need, when you need it.  Your next appointment:   3 week(s)  The format for your next appointment:   In Person  Provider:   Kate Sable, MD

## 2019-11-18 ENCOUNTER — Telehealth: Payer: Self-pay

## 2019-11-18 ENCOUNTER — Telehealth: Payer: Self-pay | Admitting: Cardiology

## 2019-11-18 DIAGNOSIS — I10 Essential (primary) hypertension: Secondary | ICD-10-CM

## 2019-11-18 NOTE — Telephone Encounter (Signed)
Pt called requesting Dr Synthia Innocent approval for new Rx from Cardiology they are requesting the following changes  STOP Furosemide (Lasix) STOP Atenolol   DECREASE Hydralazine 50 mg three times daily  START Hydrochlorothiazide 25 mg once daily START Carvedilol 25 mg twice a day    Please advise

## 2019-11-18 NOTE — Telephone Encounter (Signed)
Spoke with pt relaying Dr. Synthia Innocent message.  Pt verbalizes understanding and will call back to schedule lab visit.

## 2019-11-18 NOTE — Telephone Encounter (Signed)
Glad she was able to see cardiology. Agree with these changes.  Given starting HCTZ, recommend recheck kidney function 1 week after starting. BMP ordered.

## 2019-11-18 NOTE — Telephone Encounter (Signed)
Left voicemail message to call back regarding her medication questions.

## 2019-11-18 NOTE — Telephone Encounter (Signed)
Pt c/o medication issue:  1. Name of Medication: carvedilol (COREG)  2. How are you currently taking this medication (dosage and times per day)? 1 tablet 2 times daily   3. Are you having a reaction (difficulty breathing--STAT)? lightheaded  4. What is your medication issue? Patient calling, states that since she started medication she has been lightheaded, enough to make her concerned about driving.  Would like to know if she should just take 1 pill daily.  Please call to discuss.

## 2019-11-21 NOTE — Telephone Encounter (Signed)
Left voicemail message to confirm appointment for tomorrow and to call back if it needs to be rescheduled with our number to call back.

## 2019-11-21 NOTE — Telephone Encounter (Signed)
Patient returning call.

## 2019-11-21 NOTE — Telephone Encounter (Signed)
Patient scheduled for 10-19 at 8 am   She also wants to discuss elevated HR and frequent urination at night

## 2019-11-22 ENCOUNTER — Other Ambulatory Visit: Payer: Self-pay

## 2019-11-22 ENCOUNTER — Ambulatory Visit (INDEPENDENT_AMBULATORY_CARE_PROVIDER_SITE_OTHER): Payer: Medicare Other | Admitting: Cardiology

## 2019-11-22 ENCOUNTER — Encounter: Payer: Self-pay | Admitting: Cardiology

## 2019-11-22 VITALS — BP 112/44 | HR 60 | Ht 65.5 in | Wt 158.1 lb

## 2019-11-22 DIAGNOSIS — I10 Essential (primary) hypertension: Secondary | ICD-10-CM | POA: Diagnosis not present

## 2019-11-22 DIAGNOSIS — E7800 Pure hypercholesterolemia, unspecified: Secondary | ICD-10-CM | POA: Diagnosis not present

## 2019-11-22 NOTE — Patient Instructions (Addendum)
Medication Instructions:  - Your physician has recommended you make the following change in your medication:   1) STOP hydralazine  *If you need a refill on your cardiac medications before your next appointment, please call your pharmacy*   Lab Work: - none ordered  If you have labs (blood work) drawn today and your tests are completely normal, you will receive your results only by: Marland Kitchen MyChart Message (if you have MyChart) OR . A paper copy in the mail If you have any lab test that is abnormal or we need to change your treatment, we will call you to review the results.   Testing/Procedures: - none ordered   Follow-Up: At [facility], you and your health needs are our priority.  As part of our continuing mission to provide you with exceptional heart care, we have created designated Provider Care Teams.  These Care Teams include your primary Cardiologist (physician) and Advanced Practice Providers (APPs -  Physician Assistants and Nurse Practitioners) who all work together to provide you with the care you need, when you need it.  We recommend signing up for the patient portal called "MyChart".  Sign up information is provided on this After Visit Summary.  MyChart is used to connect with patients for Virtual Visits (Telemedicine).  Patients are able to view lab/test results, encounter notes, upcoming appointments, etc.  Non-urgent messages can be sent to your provider as well.   To learn more about what you can do with MyChart, go to [email or web address].    Your next appointment:   2-3 week(s)  The format for your next appointment:   In Person  Provider:   Kate Sable, MD (only)    Other Instructions n/a

## 2019-11-22 NOTE — Progress Notes (Signed)
Cardiology Office Note:    Date:  11/22/2019   ID:  Kristine Owens, DOB 1930/07/04, MRN [id number]  PCP:  Kristine Bush, MD  [facility] Cardiologist:  Kristine Sable, MD  Kristine Owens Electrophysiologist:  None   Referring MD: Kristine Bush, MD   Chief Complaint  Patient presents with  . OTHER    Lightheaded/ discuss medication issue. Meds reviewed verbally with pt.    History of Present Illness:    Shelie Maebell Owens is a 86 y.o. female with a hx of hypertension, hyperlipidemia who presents for follow-up.  Patient previously seen due to difficult to control blood pressures.  Her medications were adjusted, Lasix and atenolol was stopped, hydralazine decreased.  Diuretic/HCTZ was started.  Patient states having lightheadedness, for 1 day after taking carvedilol 25 mg twice daily.  Kristine decrease dose to half a tab/12.5 mg with improvement in symptoms.  Her blood pressure have been better, her dizziness resolved.   Past Medical History:  Diagnosis Date  . ERM OD (epiretinal membrane, right eye) 2015   also with R choroidal nevus; referred to Dr. Zigmund Owens  . GERD (gastroesophageal reflux disease) 02/2000  . Hyperlipemia 07/2000  . Hypertension 01/2001  . Hypertensive retinopathy of both eyes    [facility]    Past Surgical History:  Procedure Laterality Date  . CATARACT EXTRACTION Bilateral 06/2016   [name]  . TONSILLECTOMY    . TOTAL ABDOMINAL HYSTERECTOMY  1981   w/BSO for fibroids (Dr. Jerene Owens)    Current Medications: Current Meds  Medication Sig  . amLODipine (NORVASC) 5 MG tablet Take 1 tablet (5 mg total) by mouth at bedtime.  Marland Kitchen aspirin EC 81 MG tablet Take 1 tablet (81 mg total) by mouth daily.  Marland Kitchen atorvastatin (LIPITOR) 20 MG tablet Take 1 tablet (20 mg total) by mouth daily. (Patient taking differently: Take 20 mg by mouth every other day. )  . b complex vitamins tablet Take 1 tablet by mouth daily.    . carvedilol (COREG) 25 MG tablet Take 0.5  tablet (12.5 mg) by mouth twice daily  . cholecalciferol (VITAMIN D) 1000 UNITS tablet Take 1,000 Units by mouth daily.    . hydrochlorothiazide (HYDRODIURIL) 25 MG tablet Take 1 tablet (25 mg total) by mouth daily.  Marland Kitchen ibuprofen (ADVIL,MOTRIN) 200 MG tablet Take 200 mg by mouth every 6 (six) hours as needed.  . Multiple Vitamin (MULTIVITAMIN) tablet Take 1 tablet by mouth daily.    Marland Kitchen omeprazole (PRILOSEC OTC) 20 MG tablet Take 20 mg by mouth as needed.    . valsartan (DIOVAN) 320 MG tablet Take 1 tablet (320 mg total) by mouth daily.  Marland Kitchen zolpidem (AMBIEN) 5 MG tablet Take 1 tablet (5 mg total) by mouth at bedtime as needed for sleep.  . [DISCONTINUED] carvedilol (COREG) 25 MG tablet Take 1 tablet (25 mg total) by mouth 2 (two) times daily. (Patient taking differently: Take 0.5 mg by mouth 2 (two) times daily. )  . [DISCONTINUED] hydrALAZINE (APRESOLINE) 50 MG tablet Take 1 tablet (50 mg total) by mouth 3 (three) times daily.     Allergies:   Amlodipine, Codeine sulfate, Maxzide [hydrochlorothiazide w-triamterene], and Trazodone and nefazodone   Social History   Socioeconomic History  . Marital status: Widowed    Spouse name: Not on file  . Number of children: 1  . Years of education: Not on file  . Highest education level: Not on file  Occupational History  . Occupation: Studio//Artist//Teaches    Employer: RETIRED  Tobacco Use  . Smoking status: Never Smoker  . Smokeless tobacco: Never Used  Vaping Use  . Vaping Use: Never used  Substance and Sexual Activity  . Alcohol use: Yes    Alcohol/week: 14.0 standard drinks    Types: 14 Glasses of wine per week    Comment: red wine several times a week  . Drug use: No  . Sexual activity: Yes  Other Topics Concern  . 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 Difficulty of Paying Living Expenses: Not hard at all  Food Insecurity: No Food Insecurity  . Worried About Charity fundraiser in the Last Year: Never true  . Ran Out of Food in the Last Year: Never true  Transportation Needs: No Transportation Needs  . Lack of Transportation (Medical): No  . Lack of Transportation (Non-Medical): No  Physical Activity: Inactive  . Days of Exercise per Week: 0 days  . Minutes of Exercise per Session: 0 min  Stress: No Stress Concern Present  . Feeling of Stress : Not at all  Social Connections:   . Frequency of Communication with Friends and Family: Not on file  . Frequency of Social Gatherings with Friends and Family: Not on file  . Attends Religious Services: Not on file  . Active Member of Clubs or Organizations: Not on file  . Attends Archivist Meetings: Not on file  . Marital Status: Not on file     Family History: The patient's family history includes Aneurysm in her brother; Breast cancer in her maternal aunt; Cancer in her brother and maternal aunt; Heart disease in her father; Hypertension in her father and mother; Kidney disease in her father.  ROS:   Please see the history of present illness.     All other systems reviewed and are negative.  EKGs/Labs/Other Studies Reviewed:    The following studies were reviewed today:   EKG:  EKG is  ordered today.  The ekg ordered today demonstrates normal sinus rhythm, nonspecific ST changes.  Recent Labs: 02/02/2019: ALT 28 04/06/2019: TSH 1.98 10/20/2019: Magnesium 1.5 10/31/2019: BUN 5; Creatinine, Ser 0.80; Hemoglobin 13.3; Platelets 330; Potassium 4.1; Sodium 131  Recent Lipid Panel    Component Value Date/Time   CHOL  217 (H) 02/02/2019 0845   TRIG 94.0 02/02/2019 0845   HDL 77.90 02/02/2019 0845   CHOLHDL 3 02/02/2019 0845   VLDL 18.8 02/02/2019 0845   LDLCALC 120 (H) 02/02/2019 0845   LDLDIRECT 121.3 12/16/2011 0837     Risk Assessment/Calculations:      Physical Exam:    VS:  BP (!) 112/44 (BP Location: Left Arm, Patient Position: Sitting, Cuff Size: Normal)   Pulse 60   Ht 5' 5.5" (1.664 m)   Wt 158 lb 2 oz (71.7 kg)   SpO2 97%   BMI 25.91 kg/m     Wt Readings from Last 3 Encounters:  11/22/19 158 lb 2 oz (71.7 kg)  11/17/19 159 lb (72.1 kg)  10/31/19 161 lb (73 kg)  GEN:  Well nourished, well developed in no acute distress HEENT: Normal NECK: No JVD; No carotid bruits LYMPHATICS: No lymphadenopathy CARDIAC: RRR, no murmurs, rubs, gallops RESPIRATORY:  Clear to auscultation without rales, wheezing or rhonchi  ABDOMEN: Soft, non-tender, non-distended MUSCULOSKELETAL:  No edema; No deformity  SKIN: Warm and dry NEUROLOGIC:  Alert and oriented x 3 PSYCHIATRIC:  Normal affect   ASSESSMENT:    1. Essential hypertension   2. Pure hypercholesterolemia    PLAN:    In order of problems listed above:  1. Patient with history of uncontrolled blood pressure.  BP currently on the low side and associated with dizziness.  Orthostatic vitals today with no evidence of orthostasis, but blood pressure trended downwards upon standing from sitting position from 157 systolic to 96 systolic. Stop hydralazine.  Continue Coreg 12.5 mg twice daily, amlodipine 5 mg daily, HCTZ 25 mg daily, valsartan 320 mg daily.  Monitor blood pressures daily and keep BP log.  If blood pressures stay normal or low, will consider further down titration of BP meds.  2. History of hyperlipidemia, continue Lipitor as prescribed.  Follow-up with myself in 2 to 3 weeks.  Total encounter time 40 minutes  Greater than 50% was spent in counseling and coordination of care with the patient Education  management    Medication Adjustments/Labs and Tests Ordered: Current medicines are reviewed at length with the patient today.  Concerns regarding medicines are outlined above.  Orders Placed This Encounter  Procedures  . EKG 12-Lead   No orders of the defined types were placed in this encounter.   Patient Instructions  Medication Instructions:  - Your physician has recommended you make the following change in your medication:   1) STOP hydralazine  *If you need a refill on your cardiac medications before your next appointment, please call your pharmacy*   Lab Work: - none ordered  If you have labs (blood work) drawn today and your tests are completely normal, you will receive your results only by: Marland Kitchen MyChart Message (if you have MyChart) OR . A paper copy in the mail If you have any lab test that is abnormal or we need to change your treatment, we will call you to review the results.   Testing/Procedures: - none ordered   Follow-Up: At [facility], you and your health needs are our priority.  As part of our continuing mission to provide you with exceptional heart care, we have created designated Provider Care Teams.  These Care Teams include your primary Cardiologist (physician) and Advanced Practice Providers (APPs -  Physician Assistants and Nurse Practitioners) who all work together to provide you with the care you need, when you need it.  We recommend signing up for the patient portal called "MyChart".  Sign up information is provided on this After Visit Summary.  MyChart is used to connect with patients for Virtual Visits (Telemedicine).  Patients are able to view lab/test results, encounter notes, upcoming appointments, etc.  Non-urgent messages can be sent to your provider as well.   To learn more about what you can do with MyChart, go to [email or web address].    Your next appointment:   2-3 week(s)  The format for your next appointment:   In  Person  Provider:   Kate Sable, MD (only)    Other Instructions n/a    Signed, Kristine Sable, MD  11/22/2019 10:03 AM    [place]

## 2019-11-25 ENCOUNTER — Other Ambulatory Visit (INDEPENDENT_AMBULATORY_CARE_PROVIDER_SITE_OTHER): Payer: Medicare Other

## 2019-11-25 ENCOUNTER — Other Ambulatory Visit: Payer: Self-pay

## 2019-11-25 DIAGNOSIS — I10 Essential (primary) hypertension: Secondary | ICD-10-CM | POA: Diagnosis not present

## 2019-11-25 LAB — BASIC METABOLIC PANEL
BUN: 22 mg/dL (ref 6–23)
CO2: 31 mEq/L (ref 19–32)
Calcium: 9.3 mg/dL (ref 8.4–10.5)
Chloride: 96 mEq/L (ref 96–112)
Creatinine, Ser: 1.1 mg/dL (ref 0.40–1.20)
GFR: 45.36 mL/min — ABNORMAL LOW (ref >60.00)
Glucose, Bld: 87 mg/dL (ref 70–99)
Potassium: 3.2 mEq/L — ABNORMAL LOW (ref 3.5–5.1)
Sodium: 134 mEq/L — ABNORMAL LOW (ref 135–145)

## 2019-11-27 ENCOUNTER — Other Ambulatory Visit: Payer: Self-pay | Admitting: Family Medicine

## 2019-11-27 MED ORDER — POTASSIUM CHLORIDE ER 10 MEQ PO TBCR: 10.0000 meq | EXTENDED_RELEASE_TABLET | Freq: Every day | ORAL | 3 refills | Status: DC

## 2019-11-29 ENCOUNTER — Ambulatory Visit (INDEPENDENT_AMBULATORY_CARE_PROVIDER_SITE_OTHER): Payer: Medicare Other | Admitting: Family Medicine

## 2019-11-29 ENCOUNTER — Other Ambulatory Visit: Payer: Self-pay

## 2019-11-29 ENCOUNTER — Encounter: Payer: Self-pay | Admitting: Family Medicine

## 2019-11-29 VITALS — BP 112/56 | HR 73 | Temp 98.2°F | Ht 65.5 in | Wt 158.4 lb

## 2019-11-29 DIAGNOSIS — G4700 Insomnia, unspecified: Secondary | ICD-10-CM

## 2019-11-29 DIAGNOSIS — I10 Essential (primary) hypertension: Secondary | ICD-10-CM

## 2019-11-29 DIAGNOSIS — N289 Disorder of kidney and ureter, unspecified: Secondary | ICD-10-CM | POA: Insufficient documentation

## 2019-11-29 DIAGNOSIS — Z23 Encounter for immunization: Secondary | ICD-10-CM | POA: Diagnosis not present

## 2019-11-29 MED ORDER — AMLODIPINE BESYLATE 2.5 MG PO TABS: 2.5000 mg | ORAL_TABLET | Freq: Every day | ORAL | 1 refills | Status: DC

## 2019-11-29 MED ORDER — ZOLPIDEM TARTRATE 5 MG PO TABS: 5.0000 mg | ORAL_TABLET | Freq: Every evening | ORAL | 0 refills | Status: DC | PRN

## 2019-11-29 NOTE — Assessment & Plan Note (Addendum)
Appreciate cardiology assistance. Reviewed med changes to date.  Hypokalemia - hasn't yet started potassium replacement - will pick up today.  She had not yet stopped hydralazine - advised stop this.  She is actually only taking amlodipine 2.5mg  daily - continue lower dose. Continue other regimen as per updated med list.  Continue monitoring BP at home, f/u with cards in 2 wks as scheduled. Update if any questions or concerns.

## 2019-11-29 NOTE — Progress Notes (Signed)
This visit was conducted in person.  BP (!) 112/56 (BP Location: Right Arm, Patient Position: Sitting, Cuff Size: Normal)   Pulse 73   Temp 98.2 F (36.8 C) (Temporal)   Ht 5' 5.5" (1.664 m)   Wt 158 lb 6 oz (71.8 kg)   SpO2 98%   BMI 25.95 kg/m   No data found.  CC: HTN f/u visit  Subjective:    Patient ID: Kristine Owens, female    DOB: October 13, 1930, 86 y.o.   MRN: [id number]  HPI: Kristine Owens is a 86 y.o. female presenting on 11/29/2019 for Hypertension (Here for 4 wk f/u.)   See recent notes for details. For difficult to control hypertension, we referred to cardiology. Appreciate Dr Thereasa Solo care. Lasix, atenolol stopped, hydralazine also recently stopped. HCTZ 25mg  started, monitoring potassium and Cr. F/u cards planned 12/15/2019.   HTN - Compliant with current antihypertensive regimen of hctz 25mg  daily (takes 2 12.5mg  tablets daily), amlodipine 2.5mg  daily (she never started 5mg  dose), carvedilol 25mg  1/2 tab bid, and valsartan 320mg  daily. She has continued hydralazine 25mg  TID. Does check blood pressures at home: 120-130/60-70s on average, some highs 160-170, some lows 107-114. Occasional dizziness. Denies HA, vision changes, CP/tightness, SOB, leg swelling. Occasional L arm tingling.   Insomnia - ambien 1/2 tab 5mg  working well for sleep. Requests refill. Taking every few nights.   Increasing fall allergies noted this year - head congestion, muffled hearing, frontal sinus pressure. Managing with claritin. Discussed flonase, nasal saline irrigation.      Relevant past medical, surgical, family and social history reviewed and updated as indicated. Interim medical history since our last visit reviewed. Allergies and medications reviewed and updated. Outpatient Medications Prior to Visit  Medication Sig Dispense Refill  . aspirin EC 81 MG tablet Take 1 tablet (81 mg total) by mouth daily.    Marland Kitchen atorvastatin (LIPITOR) 20 MG tablet Take 1 tablet (20 mg  total) by mouth daily. (Patient taking differently: Take 20 mg by mouth every other day. ) 30 tablet 6  . b complex vitamins tablet Take 1 tablet by mouth daily.      . carvedilol (COREG) 25 MG tablet Take 0.5 tablet (12.5 mg) by mouth twice daily    . cholecalciferol (VITAMIN D) 1000 UNITS tablet Take 1,000 Units by mouth daily.      . hydrochlorothiazide (HYDRODIURIL) 25 MG tablet Take 1 tablet (25 mg total) by mouth daily. 90 tablet 3  . ibuprofen (ADVIL,MOTRIN) 200 MG tablet Take 200 mg by mouth every 6 (six) hours as needed.    . Multiple Vitamin (MULTIVITAMIN) tablet Take 1 tablet by mouth daily.      Marland Kitchen omeprazole (PRILOSEC OTC) 20 MG tablet Take 20 mg by mouth as needed.      . potassium chloride (KLOR-CON) 10 MEQ tablet Take 1 tablet (10 mEq total) by mouth daily. 30 tablet 3  . valsartan (DIOVAN) 320 MG tablet Take 1 tablet (320 mg total) by mouth daily. 90 tablet 1  . amLODipine (NORVASC) 5 MG tablet Take 1 tablet (5 mg total) by mouth at bedtime. 30 tablet 3  . zolpidem (AMBIEN) 5 MG tablet Take 1 tablet (5 mg total) by mouth at bedtime as needed for sleep. 15 tablet 0   No facility-administered medications prior to visit.     Per HPI unless specifically indicated in ROS section below Review of Systems Objective:  BP (!) 112/56 (BP Location: Right Arm, Patient Position: Sitting, Cuff Size: Normal)  Pulse 73   Temp 98.2 F (36.8 C) (Temporal)   Ht 5' 5.5" (1.664 m)   Wt 158 lb 6 oz (71.8 kg)   SpO2 98%   BMI 25.95 kg/m   Wt Readings from Last 3 Encounters:  11/29/19 158 lb 6 oz (71.8 kg)  11/22/19 158 lb 2 oz (71.7 kg)  11/17/19 159 lb (72.1 kg)      Physical Exam Vitals and nursing note reviewed.  Constitutional:      Appearance: Normal appearance. She is not ill-appearing.  Cardiovascular:     Rate and Rhythm: Normal rate and regular rhythm.     Pulses: Normal pulses.     Heart sounds: Normal heart sounds. No murmur heard.   Pulmonary:     Effort: Pulmonary  effort is normal. No respiratory distress.     Breath sounds: Normal breath sounds. No wheezing, rhonchi or rales.  Musculoskeletal:     Right lower leg: No edema.     Left lower leg: No edema.  Neurological:     Mental Status: She is alert.  Psychiatric:        Mood and Affect: Mood normal.        Behavior: Behavior normal.       Results for orders placed or performed in visit on 60/10/93  Basic metabolic panel  Result Value Ref Range   Sodium 134 (L) 135 - 145 mEq/L   Potassium 3.2 (L) 3.5 - 5.1 mEq/L   Chloride 96 96 - 112 mEq/L   CO2 31 19 - 32 mEq/L   Glucose, Bld 87 70 - 99 mg/dL   BUN 22 6 - 23 mg/dL   Creatinine, Ser 1.10 0.40 - 1.20 mg/dL   GFR 45.36 (L) >60.00 mL/min   Calcium 9.3 8.4 - 10.5 mg/dL   Assessment & Plan:  This visit occurred during the SARS-CoV-2 public health emergency.  Safety protocols were in place, including screening questions prior to the visit, additional usage of staff PPE, and extensive cleaning of exam room while observing appropriate contact time as indicated for disinfecting solutions.   Problem List Items Addressed This Visit    Renal insufficiency    Bump noted in Cr since starting HCTZ 25mg  - will continue to monitor.       Insomnia    Refill ambien 5mg  (takes 1/2 at night PRN which is effective). Reviewed sparing use.       Relevant Medications   zolpidem (AMBIEN) 5 MG tablet   Essential hypertension - Primary    Appreciate cardiology assistance. Reviewed med changes to date.  Hypokalemia - hasn't yet started potassium replacement - will pick up today.  She had not yet stopped hydralazine - advised stop this.  She is actually only taking amlodipine 2.5mg  daily - continue lower dose. Continue other regimen as per updated med list.  Continue monitoring BP at home, f/u with cards in 2 wks as scheduled. Update if any questions or concerns.       Relevant Medications   amLODipine (NORVASC) 2.5 MG tablet       Meds ordered this  encounter  Medications  . amLODipine (NORVASC) 2.5 MG tablet    Sig: Take 1 tablet (2.5 mg total) by mouth at bedtime.    Dispense:  90 tablet    Refill:  1  . zolpidem (AMBIEN) 5 MG tablet    Sig: Take 1 tablet (5 mg total) by mouth at bedtime as needed for sleep.    Dispense:  15 tablet    Refill:  0   No orders of the defined types were placed in this encounter.   Patient Instructions  I'm glad blood pressures are looking better. Keep follow up with Dr Garen Lah.  Stop hydralazine. Continue monitoring blood pressures.  If ongoing dizziness or low blood pressure readings, drop hydrochlorothiazide dose to 12.5mg  once daily.    Follow up plan: Return if symptoms worsen or fail to improve.  Ria Bush, MD

## 2019-11-29 NOTE — Assessment & Plan Note (Signed)
Bump noted in Cr since starting HCTZ 25mg  - will continue to monitor.

## 2019-11-29 NOTE — Assessment & Plan Note (Addendum)
Refill ambien 5mg  (takes 1/2 at night PRN which is effective). Reviewed sparing use.

## 2019-11-29 NOTE — Patient Instructions (Addendum)
I'm glad blood pressures are looking better. Keep follow up with Dr Garen Lah.  Stop hydralazine. Continue monitoring blood pressures.  If ongoing dizziness or low blood pressure readings, drop hydrochlorothiazide dose to 12.5mg  once daily.

## 2019-12-13 ENCOUNTER — Ambulatory Visit: Payer: Medicare Other | Admitting: Cardiology

## 2019-12-15 ENCOUNTER — Other Ambulatory Visit: Payer: Self-pay

## 2019-12-15 ENCOUNTER — Ambulatory Visit (INDEPENDENT_AMBULATORY_CARE_PROVIDER_SITE_OTHER): Payer: Medicare Other | Admitting: Cardiology

## 2019-12-15 ENCOUNTER — Encounter: Payer: Self-pay | Admitting: Cardiology

## 2019-12-15 VITALS — BP 122/62 | HR 66 | Ht 65.5 in | Wt 160.0 lb

## 2019-12-15 DIAGNOSIS — E7800 Pure hypercholesterolemia, unspecified: Secondary | ICD-10-CM | POA: Diagnosis not present

## 2019-12-15 DIAGNOSIS — I10 Essential (primary) hypertension: Secondary | ICD-10-CM | POA: Diagnosis not present

## 2019-12-15 NOTE — Patient Instructions (Signed)
Medication Instructions:   1.  Please Check with your Insurance to see if they would cover Diovan 325/25, and if they do, please call us back so we can make some medication changes.  *If you need a refill on your cardiac medications before your next appointment, please call your pharmacy*   Lab Work: None Ordered If you have labs (blood work) drawn today and your tests are completely normal, you will receive your results only by:  [place] (if you have MyChart) OR  A paper copy in the mail If you have any lab test that is abnormal or we need to change your treatment, we will call you to review the results.   Testing/Procedures: None Ordered   Follow-Up: At [facility], you and your health needs are our priority.  As part of our continuing mission to provide you with exceptional heart care, we have created designated Provider Care Teams.  These Care Teams include your primary Cardiologist (physician) and Advanced Practice Providers (APPs -  Physician Assistants and Nurse Practitioners) who all work together to provide you with the care you need, when you need it.  We recommend signing up for the patient portal called "MyChart".  Sign up information is provided on this After Visit Summary.  MyChart is used to connect with patients for Virtual Visits (Telemedicine).  Patients are able to view lab/test results, encounter notes, upcoming appointments, etc.  Non-urgent messages can be sent to your provider as well.   To learn more about what you can do with MyChart, go to [email or web address].    Your next appointment:   3 month(s)  The format for your next appointment:   In Person  Provider:   Kate Sable, MD   Other Instructions

## 2019-12-15 NOTE — Progress Notes (Signed)
Cardiology Office Note:    Date:  12/15/2019   ID:  [name], DOB [birth year], MRN [id number]  PCP:  Ria Bush, MD  [facility] Cardiologist:  Kate Sable, MD  [place] Electrophysiologist:  None   Referring MD: Ria Bush, MD   Chief Complaint  Patient presents with  . Follow-up    2-3 weeks  Pt states no new Sx.    History of Present Illness:    Kristine Owens is a 86 y.o. female with a hx of hypertension, hyperlipidemia who presents for follow-up.  Last seen due to dizziness, low blood pressures upon standing without evidence for orthostasis.  Her hydralazine was stopped.  Coreg had previously been decreased to 12.5 mg twice daily.  She now presents for follow-up.  She states feeling much better since decreasing her BP meds.  Has no concerns at this time.  Her blood pressures have been controlled overall, usually mildly elevated in the mornings.   Past Medical History:  Diagnosis Date  . ERM OD (epiretinal membrane, right eye) 2015   also with R choroidal nevus; referred to Dr. Zigmund Daniel  . GERD (gastroesophageal reflux disease) 02/2000  . Hyperlipemia 07/2000  . Hypertension 01/2001  . Hypertensive retinopathy of both eyes    [facility]    Past Surgical History:  Procedure Laterality Date  . CATARACT EXTRACTION Bilateral 06/2016   [name]  . TONSILLECTOMY    . TOTAL ABDOMINAL HYSTERECTOMY  1981   w/BSO for fibroids (Dr. Jerene Pitch)    Current Medications: Current Meds  Medication Sig  . amLODipine (NORVASC) 2.5 MG tablet Take 1 tablet (2.5 mg total) by mouth at bedtime.  Marland Kitchen aspirin EC 81 MG tablet Take 1 tablet (81 mg total) by mouth daily.  Marland Kitchen atorvastatin (LIPITOR) 20 MG tablet Take 1 tablet (20 mg total) by mouth daily. (Patient taking differently: Take 20 mg by mouth every other day. )  . b complex vitamins tablet Take 1 tablet by mouth daily.    . carvedilol (COREG) 25 MG tablet Take 0.5 tablet (12.5 mg) by mouth twice  daily  . cholecalciferol (VITAMIN D) 1000 UNITS tablet Take 1,000 Units by mouth daily.    . hydrochlorothiazide (HYDRODIURIL) 25 MG tablet Take 1 tablet (25 mg total) by mouth daily.  Marland Kitchen ibuprofen (ADVIL,MOTRIN) 200 MG tablet Take 200 mg by mouth every 6 (six) hours as needed.  . Multiple Vitamin (MULTIVITAMIN) tablet Take 1 tablet by mouth daily.    Marland Kitchen omeprazole (PRILOSEC OTC) 20 MG tablet Take 20 mg by mouth as needed.    . potassium chloride (KLOR-CON) 10 MEQ tablet Take 1 tablet (10 mEq total) by mouth daily.  . valsartan (DIOVAN) 320 MG tablet Take 1 tablet (320 mg total) by mouth daily.  Marland Kitchen zolpidem (AMBIEN) 5 MG tablet Take 1 tablet (5 mg total) by mouth at bedtime as needed for sleep.     Allergies:   Amlodipine, Codeine sulfate, Maxzide [hydrochlorothiazide w-triamterene], and Trazodone and nefazodone   Social History   Socioeconomic History  . Marital status: Widowed    Spouse name: Not on file  . Number of children: 1  . Years of education: Not on file  . Highest education level: Not on file  Occupational History  . Occupation: Studio//Artist//Teaches    Employer: RETIRED  Tobacco Use  . Smoking status: Never Smoker  . Smokeless tobacco: Never Used  Vaping Use  . Vaping Use: Never used  Substance and Sexual Activity  . Alcohol use:  Yes    Alcohol/week: 14.0 standard drinks    Types: 14 Glasses of wine per week    Comment: red wine several times a week  . Drug use: No  . Sexual activity: Yes  Other Topics Concern  . 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 Difficulty of Paying Living Expenses: Not hard at all  Food Insecurity: No Food Insecurity  . Worried About Charity fundraiser in the Last Year: Never true  . Ran Out of Food in the Last Year: Never true  Transportation Needs: No Transportation Needs  . Lack of Transportation (Medical): No  . Lack of Transportation (Non-Medical): No  Physical Activity: Inactive  . Days of Exercise per Week: 0 days  . Minutes of Exercise per Session: 0 min  Stress: No Stress Concern Present  . Feeling of Stress : Not at all  Social Connections:   . Frequency of Communication with Friends and Family: Not on file  . Frequency of Social Gatherings with Friends and Family: Not on file  . Attends Religious Services: Not on file  . Active Member of Clubs or Organizations: Not on file  . Attends Archivist Meetings: Not on file  . Marital Status: Not on file     Family History: The patient's family history includes Aneurysm in her brother; Breast cancer in her maternal aunt; Cancer in her brother and maternal aunt; Heart disease in her father; Hypertension in her father and mother; Kidney disease in her father.  ROS:   Please see the history of present illness.     All other systems reviewed and are negative.  EKGs/Labs/Other Studies Reviewed:    The following studies were reviewed today:   EKG:  EKG is  ordered today.  The ekg ordered today demonstrates normal sinus rhythm, nonspecific ST changes.  Recent Labs: 02/02/2019: ALT 28 04/06/2019: TSH 1.98 10/20/2019: Magnesium 1.5 10/31/2019: Hemoglobin 13.3; Platelets 330 11/25/2019: BUN 22; Creatinine, Ser 1.10; Potassium 3.2; Sodium 134  Recent Lipid Panel    Component Value Date/Time   CHOL 217 (H) 02/02/2019 0845   TRIG 94.0 02/02/2019 0845   HDL 77.90 02/02/2019 0845   CHOLHDL 3 02/02/2019 0845   VLDL 18.8 02/02/2019 0845   LDLCALC 120 (H) 02/02/2019 0845   LDLDIRECT 121.3 12/16/2011 0837     Risk  Assessment/Calculations:      Physical Exam:    VS:  BP 122/62   Pulse 66   Ht 5' 5.5" (1.664 m)   Wt 160 lb (72.6 kg)   BMI 26.22 kg/m     Wt Readings from Last 3 Encounters:  12/15/19 160 lb (72.6 kg)  11/29/19 158 lb 6 oz (71.8 kg)  11/22/19 158 lb 2 oz (71.7 kg)     GEN:  Well nourished, well developed in no acute distress HEENT: Normal NECK: No JVD; No carotid bruits LYMPHATICS: No lymphadenopathy CARDIAC: RRR, no murmurs, rubs, gallops RESPIRATORY:  Clear to auscultation without rales, wheezing or rhonchi  ABDOMEN: Soft, non-tender, non-distended MUSCULOSKELETAL:  No edema; No deformity  SKIN: Warm and dry NEUROLOGIC:  Alert and oriented x 3 PSYCHIATRIC:  Normal affect   ASSESSMENT:    1. Essential hypertension   2. Pure hypercholesterolemia    PLAN:    In order of problems listed above:  1. Patient with history of uncontrolled blood pressure.  BP currently controlled, denies any symptoms of dizziness.  Continue HCTZ, valsartan amlodipine as prescribed.  Patient will check with her insurance to see if valsartan/HCTZ combo price is reasonable.  If that is the case, will plan to consolidate medicines at follow-up visit.   2. History of hyperlipidemia, continue Lipitor as prescribed.  Follow-up in 3 months.  Total encounter time 35 minutes  Greater than 50% was spent in counseling and coordination of care with the patient Education management    Medication Adjustments/Labs and Tests Ordered: Current medicines are reviewed at length with the patient today.  Concerns regarding medicines are outlined above.  No orders of the defined types were placed in this encounter.  No orders of the defined types were placed in this encounter.   Patient Instructions  Medication Instructions:   1.  Please Check with your Insurance to see if they would cover Diovan 325/25, and if they do, please call us back so we can make some medication changes.  *If you need a  refill on your cardiac medications before your next appointment, please call your pharmacy*   Lab Work: None Ordered If you have labs (blood work) drawn today and your tests are completely normal, you will receive your results only by: Marland Kitchen MyChart Message (if you have MyChart) OR . A paper copy in the mail If you have any lab test that is abnormal or we need to change your treatment, we will call you to review the results.   Testing/Procedures: None Ordered   Follow-Up: At [facility], you and your health needs are our priority.  As part of our continuing mission to provide you with exceptional heart care, we have created designated Provider Care Teams.  These Care Teams include your primary Cardiologist (physician) and Advanced Practice Providers (APPs -  Physician Assistants and Nurse Practitioners) who all work together to provide you with the care you need, when you need it.  We recommend signing up for the patient portal called "MyChart".  Sign up information is provided on this After Visit Summary.  MyChart is used to connect with patients for Virtual Visits (Telemedicine).  Patients are able to view lab/test results, encounter notes, upcoming appointments, etc.  Non-urgent messages can be sent to your provider as well.   To learn more about what you can do with MyChart, go to [email or web address].    Your next appointment:   3 month(s)  The format for your next appointment:   In Person  Provider:   Kate Sable, MD   Other Instructions      Signed, Kate Sable, MD  12/15/2019 4:43 PM    [place]

## 2019-12-20 ENCOUNTER — Telehealth: Payer: Self-pay

## 2019-12-20 ENCOUNTER — Telehealth: Payer: Self-pay | Admitting: *Deleted

## 2019-12-20 DIAGNOSIS — N289 Disorder of kidney and ureter, unspecified: Secondary | ICD-10-CM

## 2019-12-20 NOTE — Telephone Encounter (Signed)
I've placed order for BMP to be done here.

## 2019-12-20 NOTE — Telephone Encounter (Signed)
Spoke with triage nurse Rollene Fare from [facility]. She stated that she will ask Dr. Danise Mina if they can order the labs for the convenience of the patient as she lives closer to their office and requested going there.

## 2019-12-20 NOTE — Telephone Encounter (Signed)
Colleen from [facility] called stating that patient needs to have a repeat BMP done in two weeks around 01/03/20. Jaclyn Shaggy stated that Dr. Garen Lah has ordered this repeat lab work. Jaclyn Shaggy stated that patient really wants to have the lab work drawn at [facility] because it is so much closer for her and it is more difficulty for her to get to [place]. Jaclyn Shaggy wants to know if Dr. Danise Mina will approve for the labs to be drawn at [facility]? Jaclyn Shaggy wants to know if they need to put the lab order in if patient can get them drawn at [facility]?

## 2019-12-20 NOTE — Telephone Encounter (Signed)
Spoke with patient and informed her that we would like her to have another lab draw in 2 weeks. She requested to have that lab draw at [facility]. I left a VM on the nurse triage line about this to put orders in correctly, and will follow up if I do not here back in the next 72 hours as instructed on their VM.

## 2019-12-20 NOTE — Telephone Encounter (Signed)
Left a message for patient to call back so I can relay the following recommendation from Dr. Garen Lah:  please schedule patient for repeat bmp in about 2 weeks.

## 2019-12-21 NOTE — Telephone Encounter (Signed)
Sabrina at [facility] notified by telephone that order has been placed per Dr. Danise Mina.

## 2019-12-21 NOTE — Telephone Encounter (Signed)
Regina from [facility] called to let us know they will draw BMP for patient

## 2020-01-06 ENCOUNTER — Telehealth: Payer: Self-pay | Admitting: Family Medicine

## 2020-01-06 NOTE — Telephone Encounter (Signed)
See prior phone note. This order was already placed 2 wks ago.

## 2020-01-06 NOTE — Telephone Encounter (Signed)
Pt called wanted to get lab work done due to she is on a new medication.  She is set to have her appointment 12/7 at 10:10 am

## 2020-01-10 ENCOUNTER — Other Ambulatory Visit: Payer: Self-pay

## 2020-01-10 ENCOUNTER — Other Ambulatory Visit (INDEPENDENT_AMBULATORY_CARE_PROVIDER_SITE_OTHER): Payer: Medicare Other

## 2020-01-10 DIAGNOSIS — N289 Disorder of kidney and ureter, unspecified: Secondary | ICD-10-CM

## 2020-01-10 LAB — BASIC METABOLIC PANEL
BUN: 22 mg/dL (ref 6–23)
CO2: 30 mEq/L (ref 19–32)
Calcium: 9.5 mg/dL (ref 8.4–10.5)
Chloride: 96 mEq/L (ref 96–112)
Creatinine, Ser: 0.87 mg/dL (ref 0.40–1.20)
GFR: 60.06 mL/min (ref >60.00)
Glucose, Bld: 100 mg/dL — ABNORMAL HIGH (ref 70–99)
Potassium: 3.5 mEq/L (ref 3.5–5.1)
Sodium: 134 mEq/L — ABNORMAL LOW (ref 135–145)

## 2020-02-01 ENCOUNTER — Other Ambulatory Visit: Payer: Self-pay | Admitting: Family Medicine

## 2020-02-01 DIAGNOSIS — Z1231 Encounter for screening mammogram for malignant neoplasm of breast: Secondary | ICD-10-CM

## 2020-02-03 ENCOUNTER — Other Ambulatory Visit: Payer: Self-pay | Admitting: Family Medicine

## 2020-02-19 ENCOUNTER — Other Ambulatory Visit: Payer: Self-pay | Admitting: Family Medicine

## 2020-02-20 NOTE — Telephone Encounter (Signed)
Do you want patient to continue on this?

## 2020-02-22 ENCOUNTER — Ambulatory Visit (INDEPENDENT_AMBULATORY_CARE_PROVIDER_SITE_OTHER): Payer: Medicare Other | Admitting: Family Medicine

## 2020-02-22 ENCOUNTER — Encounter: Payer: Self-pay | Admitting: Family Medicine

## 2020-02-22 ENCOUNTER — Other Ambulatory Visit: Payer: Self-pay

## 2020-02-22 VITALS — BP 160/78 | HR 64 | Temp 97.6°F | Ht 64.75 in | Wt 165.3 lb

## 2020-02-22 DIAGNOSIS — R7303 Prediabetes: Secondary | ICD-10-CM | POA: Diagnosis not present

## 2020-02-22 DIAGNOSIS — Z0000 Encounter for general adult medical examination without abnormal findings: Secondary | ICD-10-CM

## 2020-02-22 DIAGNOSIS — H35033 Hypertensive retinopathy, bilateral: Secondary | ICD-10-CM

## 2020-02-22 DIAGNOSIS — Z7189 Other specified counseling: Secondary | ICD-10-CM

## 2020-02-22 DIAGNOSIS — H9191 Unspecified hearing loss, right ear: Secondary | ICD-10-CM | POA: Diagnosis not present

## 2020-02-22 DIAGNOSIS — H40003 Preglaucoma, unspecified, bilateral: Secondary | ICD-10-CM

## 2020-02-22 DIAGNOSIS — I10 Essential (primary) hypertension: Secondary | ICD-10-CM

## 2020-02-22 DIAGNOSIS — G4700 Insomnia, unspecified: Secondary | ICD-10-CM | POA: Diagnosis not present

## 2020-02-22 DIAGNOSIS — E7800 Pure hypercholesterolemia, unspecified: Secondary | ICD-10-CM | POA: Diagnosis not present

## 2020-02-22 LAB — COMPREHENSIVE METABOLIC PANEL
ALT: 23 U/L (ref 0–35)
AST: 17 U/L (ref 0–37)
Albumin: 4.2 g/dL (ref 3.5–5.2)
Alkaline Phosphatase: 78 U/L (ref 39–117)
BUN: 13 mg/dL (ref 6–23)
CO2: 28 mEq/L (ref 19–32)
Calcium: 9.3 mg/dL (ref 8.4–10.5)
Chloride: 103 mEq/L (ref 96–112)
Creatinine, Ser: 0.82 mg/dL (ref 0.40–1.20)
GFR: 64.43 mL/min (ref >60.00)
Glucose, Bld: 83 mg/dL (ref 70–99)
Potassium: 4.1 mEq/L (ref 3.5–5.1)
Sodium: 137 mEq/L (ref 135–145)
Total Bilirubin: 0.5 mg/dL (ref 0.2–1.2)
Total Protein: 6.2 g/dL (ref 6.0–8.3)

## 2020-02-22 LAB — LIPID PANEL
Cholesterol: 213 mg/dL — ABNORMAL HIGH (ref 0–200)
HDL: 85.7 mg/dL (ref >39.00)
LDL Cholesterol: 98 mg/dL (ref 0–99)
NonHDL: 127.16
Total CHOL/HDL Ratio: 2
Triglycerides: 144 mg/dL (ref 0.0–149.0)
VLDL: 28.8 mg/dL (ref 0.0–40.0)

## 2020-02-22 LAB — HEMOGLOBIN A1C: Hgb A1c MFr Bld: 5.3 % (ref 4.6–6.5)

## 2020-02-22 LAB — TSH: TSH: 2.15 u[IU]/mL (ref 0.35–4.50)

## 2020-02-22 NOTE — Assessment & Plan Note (Signed)
Stable period on atorvastatin - update FLP. The ASCVD Risk score Mikey Bussing DC Jr., et al., 2013) failed to calculate for the following reasons:   The 2013 ASCVD risk score is only valid for ages 70 to 56

## 2020-02-22 NOTE — Assessment & Plan Note (Signed)
Advanced directives: scanned 02/2019. Wants daughter [name] to be HCPOA. [place] for temporary life support/ CPR/ intubation but does not want prolonged life support if terminal condition. [place] with artifical hydration.

## 2020-02-22 NOTE — Assessment & Plan Note (Signed)
Hard of hearing.  Encouraged regular hearing aid use.

## 2020-02-22 NOTE — Assessment & Plan Note (Signed)
Appreciate optho care.

## 2020-02-22 NOTE — Assessment & Plan Note (Signed)

## 2020-02-22 NOTE — Assessment & Plan Note (Signed)
Discussed ambien use along with retrial melatonin.  I don't think this should cause hypertension

## 2020-02-22 NOTE — Progress Notes (Signed)
Patient ID: Kristine Owens, female    DOB: 10/02/1930, 87 y.o.   MRN: [id number]  This visit was conducted in person.  BP (!) 160/78 (BP Location: Left Arm, Patient Position: Sitting, Cuff Size: Normal)   Pulse 64   Temp 97.6 F (36.4 C) (Temporal)   Ht 5' 4.75" (1.645 m)   Wt 165 lb 5 oz (75 kg)   SpO2 98%   BMI 27.72 kg/m   180/80 on retesting  CC: AMW Subjective:   HPI: Kristine Owens is a 87 y.o. female presenting on 02/22/2020 for Medicare Wellness   Did not see health advisor this year.    Hearing Screening   125Hz  250Hz  500Hz  1000Hz  2000Hz  3000Hz  4000Hz  6000Hz  8000Hz   Right ear:   0 0 0  0    Left ear:   40 0 0  0    Comments: Wears right ear hearing aid.  Not wearing at today's OV. Heard practice tone at 40 dBHL.  Vision Screening Comments: Last eye exam, 08/2019.  [place] Office Visit from 02/22/2020 in [place] at [facility] Total Score 0    Left hearing aide at home.  Fall Risk  02/22/2020 02/09/2019 02/01/2018 01/29/2017 01/23/2016  Falls in the past year? 0 0 0 No Yes  Number falls in past yr: - 0 - - 1  Injury with Fall? - 0 - - Yes  Risk for fall due to : - Medication side effect - - -  Follow up - Falls evaluation completed;Falls prevention discussed - - Falls evaluation completed    Seeing cardiology [facility]) for difficult to control HTN management.  Continues hctz 25mg  daily, amlodipine 2.5mg  daily, carvedilol 12.5mg  bid, and valsartan 320mg  daily. No longer taking hydralazine 25mg  tid. She also stopped hctz - states was told to stop. I don't see where this was rec from our office or cardiology office.  Blood pressures did well through Christmas but she has noted increased BP readings in am correlating to poor sleep - she stopped ambien, melatonin was ineffective.   Preventative: Colon cancer screening - 2010 Ar[facility] Hughs) normal, some ext hemorrhoids. Given h/o adenomatous polyps, rec rpt in 5 yrs. iFOB neg  yearly, last 02/2018. Will age out.  Mammogram -birads1 02/2019 at [facility].Does breast exams at home. Rpt scheduled next month.  Well woman exam- s/ptotalhysterectomy, ovaries removed as well. Aged out.  DEXA - normal 2010.  Flu shot - yearly. [place] 02/2019, 03/2019, 11/2019 Pneumovax - 2003. Prevnar 2015. Td - 2013 zostavax- 2011. shingrix - discussed  Advanced directives: scanned 02/2019. Wants daughter [name] to be HCPOA. [place] for temporary life support/ CPR/ intubation but does not want prolonged life support if terminal condition. [place] with artifical hydration.  Seat belt use discussed  Sunscreen use discussed. No changing moles. Sees derm yearly.  Non smoker Alcohol - 2 glasses wine nightly  Dentist q6 mo  Eye exam yearly - cataracts removed. Bowel - no diarrhea/constipation  Bladder - no incontinence  "Kristine Owens" Married/remarried Husband with dx creutzfeldt-jakob prion disease 2017 then memory unit of nursing home - passed away 15-Jan-2016 Lives alone. One daughter Kristine Owens, local Occupation: retired, previously enjoyed teaching 2 art classes/wk  Activity: sometimes uses weights, some walking but limited by bunion Diet: good water, fruits/vegetables daily     Relevant past medical, surgical, family and social history reviewed and updated as indicated. Interim medical history since our last visit reviewed. Allergies and medications reviewed and  updated. Outpatient Medications Prior to Visit  Medication Sig Dispense Refill  . amLODipine (NORVASC) 2.5 MG tablet Take 1 tablet (2.5 mg total) by mouth at bedtime. 90 tablet 1  . aspirin EC 81 MG tablet Take 1 tablet (81 mg total) by mouth daily.    Marland Kitchen atorvastatin (LIPITOR) 20 MG tablet Take 1 tablet (20 mg total) by mouth daily. (Patient taking differently: Take 20 mg by mouth every other day.) 30 tablet 6  . b complex vitamins tablet Take 1 tablet by mouth daily.    . carvedilol (COREG) 25 MG tablet Take 0.5  tablet (12.5 mg) by mouth twice daily    . cholecalciferol (VITAMIN D) 1000 UNITS tablet Take 1,000 Units by mouth daily.    Marland Kitchen ibuprofen (ADVIL,MOTRIN) 200 MG tablet Take 200 mg by mouth every 6 (six) hours as needed.    . Multiple Vitamin (MULTIVITAMIN) tablet Take 1 tablet by mouth daily.    Marland Kitchen omeprazole (PRILOSEC OTC) 20 MG tablet Take 20 mg by mouth as needed.    . potassium chloride (KLOR-CON) 10 MEQ tablet TAKE 1 TABLET BY MOUTH EVERY DAY 90 tablet 1  . valsartan (DIOVAN) 320 MG tablet Take 1 tablet (320 mg total) by mouth daily. 90 tablet 1  . zolpidem (AMBIEN) 5 MG tablet Take 1 tablet (5 mg total) by mouth at bedtime as needed for sleep. 15 tablet 0  . hydrochlorothiazide (HYDRODIURIL) 25 MG tablet Take 1 tablet (25 mg total) by mouth daily.    . hydrochlorothiazide (HYDRODIURIL) 25 MG tablet Take 1 tablet (25 mg total) by mouth daily. 90 tablet 3   No facility-administered medications prior to visit.     Per HPI unless specifically indicated in ROS section below Review of Systems Objective:  BP (!) 160/78 (BP Location: Left Arm, Patient Position: Sitting, Cuff Size: Normal)   Pulse 64   Temp 97.6 F (36.4 C) (Temporal)   Ht 5' 4.75" (1.645 m)   Wt 165 lb 5 oz (75 kg)   SpO2 98%   BMI 27.72 kg/m   Wt Readings from Last 3 Encounters:  02/22/20 165 lb 5 oz (75 kg)  12/15/19 160 lb (72.6 kg)  11/29/19 158 lb 6 oz (71.8 kg)      Physical Exam Vitals and nursing note reviewed.  Constitutional:      General: She is not in acute distress.    Appearance: She is well-developed and well-nourished.  HENT:     Head: Normocephalic and atraumatic.     Right Ear: Hearing, tympanic membrane, ear canal and external ear normal.     Left Ear: Hearing, tympanic membrane, ear canal and external ear normal.     Mouth/Throat:     Mouth: Oropharynx is clear and moist and mucous membranes are normal.     Pharynx: No posterior oropharyngeal edema.  Eyes:     General: No scleral icterus.     Extraocular Movements: Extraocular movements intact and EOM normal.     Conjunctiva/sclera: Conjunctivae normal.     Pupils: Pupils are equal, round, and reactive to light.  Neck:     Thyroid: No thyroid mass or thyromegaly.     Vascular: No carotid bruit.  Cardiovascular:     Rate and Rhythm: Normal rate and regular rhythm.     Pulses: Normal pulses and intact distal pulses.          Radial pulses are 2+ on the right side and 2+ on the left side.  Heart sounds: Normal heart sounds. No murmur heard.   Pulmonary:     Effort: Pulmonary effort is normal. No respiratory distress.     Breath sounds: Normal breath sounds. No wheezing, rhonchi or rales.  Abdominal:     General: Abdomen is flat. Bowel sounds are normal. There is no distension.     Palpations: Abdomen is soft. There is no mass.     Tenderness: There is no abdominal tenderness. There is no guarding or rebound.     Hernia: No hernia is present.  Musculoskeletal:        General: No edema. Normal range of motion.     Cervical back: Normal range of motion and neck supple.     Right lower leg: No edema.     Left lower leg: No edema.  Lymphadenopathy:     Cervical: No cervical adenopathy.  Skin:    General: Skin is warm and dry.     Findings: No rash.  Neurological:     General: No focal deficit present.     Mental Status: She is alert and oriented to person, place, and time.     Comments:  CN grossly intact, station and gait intact Recall 3/3 Calculation 5/5 DLROW  Psychiatric:        Mood and Affect: Mood and affect and mood normal.        Behavior: Behavior normal.        Thought Content: Thought content normal.        Judgment: Judgment normal.       Results for orders placed or performed in visit on 93/81/01  Basic metabolic panel  Result Value Ref Range   Sodium 134 (L) 135 - 145 mEq/L   Potassium 3.5 3.5 - 5.1 mEq/L   Chloride 96 96 - 112 mEq/L   CO2 30 19 - 32 mEq/L   Glucose, Bld 100 (H) 70 - 99  mg/dL   BUN 22 6 - 23 mg/dL   Creatinine, Ser 0.87 0.40 - 1.20 mg/dL   GFR 60.06 >60.00 mL/min   Calcium 9.5 8.4 - 10.5 mg/dL   Assessment & Plan:  This visit occurred during the SARS-CoV-2 public health emergency.  Safety protocols were in place, including screening questions prior to the visit, additional usage of staff PPE, and extensive cleaning of exam room while observing appropriate contact time as indicated for disinfecting solutions.   Problem List Items Addressed This Visit    Prediabetes    Update A1c today.       Relevant Orders   Hemoglobin A1c   Open angle with borderline glaucoma findings, bilateral    Appreciate optho care.       Medicare annual wellness visit, subsequent - Primary    I have personally reviewed the Medicare Annual Wellness questionnaire and have noted 1. The patient's medical and social history 2. Their use of alcohol, tobacco or illicit drugs 3. Their current medications and supplements 4. The patient's functional ability including ADL's, fall risks, home safety risks and hearing or visual impairment. Cognitive function has been assessed and addressed as indicated.  5. Diet and physical activity 6. Evidence for depression or mood disorders The patients weight, height, BMI have been recorded in the chart. I have made referrals, counseling and provided education to the patient based on review of the above and I have provided the pt with a written personalized care plan for preventive services. Provider list updated.. See scanned questionairre as needed for further documentation. Reviewed  preventative protocols and updated unless pt declined.       Insomnia    Discussed ambien use along with retrial melatonin.  I don't think this should cause hypertension      Hypertensive retinopathy of both eyes   HLD (hyperlipidemia)    Stable period on atorvastatin - update FLP. The ASCVD Risk score Mikey Bussing DC Jr., et al., 2013) failed to calculate for the  following reasons:   The 2013 ASCVD risk score is only valid for ages 56 to 61       Relevant Medications   hydrochlorothiazide (HYDRODIURIL) 25 MG tablet   Other Relevant Orders   Lipid panel   Comprehensive metabolic panel   TSH   Hearing loss    Hard of hearing.  Encouraged regular hearing aid use.       Essential hypertension    Appreciate cards care. Chronic, worse readings correlate to when she stopped HCTZ. Advised restart HCTZ (along with Klor-con), RTC 2 wks lab visit only.       Relevant Medications   hydrochlorothiazide (HYDRODIURIL) 25 MG tablet   Other Relevant Orders   Basic metabolic panel   Magnesium   Advanced care planning/counseling discussion    Advanced directives: scanned 02/2019. Wants daughter [name] to be HCPOA. [place] for temporary life support/ CPR/ intubation but does not want prolonged life support if terminal condition. [place] with artifical hydration.           No orders of the defined types were placed in this encounter.  Orders Placed This Encounter  Procedures  . Lipid panel  . Comprehensive metabolic panel  . Hemoglobin A1c  . TSH  . Basic metabolic panel    Standing Status:   Future    Standing Expiration Date:   02/21/2021  . Magnesium    Standing Status:   Future    Standing Expiration Date:   02/21/2021    Patient instructions: Labs today.  Restart hctz 25mg  daily. Restart potassium pill as well.  Trial melatonin 5mg  nightly to help sleep, monitoring BP.  Continue other medicines as up to now.  If interested, check with pharmacy about new 2 shot shingles series (shingrix).  Good to see you today, return as needed or in 6 months for blood pressure follow up visit, sooner if needed.  Schedule lab visit only in another 2 weeks to monitor effect of restarting hydrochlorothiazide  Follow up plan: Return in about 6 months (around 08/21/2020), or if symptoms worsen or fail to improve, for follow up visit.  Ria Bush, MD

## 2020-02-22 NOTE — Assessment & Plan Note (Addendum)
Appreciate cards care. Chronic, worse readings correlate to when she stopped HCTZ. Advised restart HCTZ (along with Klor-con), RTC 2 wks lab visit only.

## 2020-02-22 NOTE — Assessment & Plan Note (Signed)
Update A1c today 

## 2020-02-22 NOTE — Patient Instructions (Addendum)
Labs today.  Restart hctz 25mg  daily. Restart potassium pill as well.  Trial melatonin 5mg  nightly to help sleep, monitoring BP.  Continue other medicines as up to now.  If interested, check with pharmacy about new 2 shot shingles series (shingrix).  Good to see you today, return as needed or in 6 months for blood pressure follow up visit, sooner if needed.  Schedule lab visit only in another 2 weeks to monitor effect of restarting hydrochlorothiazide  Health Maintenance After Age 48 After age 67, you are at a higher risk for certain long-term diseases and infections as well as injuries from falls. Falls are a major cause of broken bones and head injuries in people who are older than age 11. Getting regular preventive care can help to keep you healthy and well. Preventive care includes getting regular testing and making lifestyle changes as recommended by your health care provider. Talk with your health care provider about:  Which screenings and tests you should have. A screening is a test that checks for a disease when you have no symptoms.  A diet and exercise plan that is right for you. What should I know about screenings and tests to prevent falls? Screening and testing are the best ways to find a health problem early. Early diagnosis and treatment give you the best chance of managing medical conditions that are common after age 77. Certain conditions and lifestyle choices may make you more likely to have a fall. Your health care provider may recommend:  Regular vision checks. Poor vision and conditions such as cataracts can make you more likely to have a fall. If you wear glasses, make sure to get your prescription updated if your vision changes.  Medicine review. Work with your health care provider to regularly review all of the medicines you are taking, including over-the-counter medicines. Ask your health care provider about any side effects that may make you more likely to have a fall.  Tell your health care provider if any medicines that you take make you feel dizzy or sleepy.  Osteoporosis screening. Osteoporosis is a condition that causes the bones to get weaker. This can make the bones weak and cause them to break more easily.  Blood pressure screening. Blood pressure changes and medicines to control blood pressure can make you feel dizzy.  Strength and balance checks. Your health care provider may recommend certain tests to check your strength and balance while standing, walking, or changing positions.  Foot health exam. Foot pain and numbness, as well as not wearing proper footwear, can make you more likely to have a fall.  Depression screening. You may be more likely to have a fall if you have a fear of falling, feel emotionally low, or feel unable to do activities that you used to do.  Alcohol use screening. Using too much alcohol can affect your balance and may make you more likely to have a fall. What actions can I take to lower my risk of falls? General instructions  Talk with your health care provider about your risks for falling. Tell your health care provider if: ? You fall. Be sure to tell your health care provider about all falls, even ones that seem minor. ? You feel dizzy, sleepy, or off-balance.  Take over-the-counter and prescription medicines only as told by your health care provider. These include any supplements.  Eat a healthy diet and maintain a healthy weight. A healthy diet includes low-fat dairy products, low-fat (lean) meats, and fiber from  whole grains, beans, and lots of fruits and vegetables. Home safety  Remove any tripping hazards, such as rugs, cords, and clutter.  Install safety equipment such as grab bars in bathrooms and safety rails on stairs.  Keep rooms and walkways well-lit. Activity  Follow a regular exercise program to stay fit. This will help you maintain your balance. Ask your health care provider what types of exercise  are appropriate for you.  If you need a cane or walker, use it as recommended by your health care provider.  Wear supportive shoes that have nonskid soles.   Lifestyle  Do not drink alcohol if your health care provider tells you not to drink.  If you drink alcohol, limit how much you have: ? 0-1 drink a day for women. ? 0-2 drinks a day for men.  Be aware of how much alcohol is in your drink. In the [place] one drink equals one typical bottle of beer (12 oz), one-half glass of wine (5 oz), or one shot of hard liquor (1 oz).  Do not use any products that contain nicotine or tobacco, such as cigarettes and e-cigarettes. If you need help quitting, ask your health care provider. Summary  Having a healthy lifestyle and getting preventive care can help to protect your health and wellness after age 58.  Screening and testing are the best way to find a health problem early and help you avoid having a fall. Early diagnosis and treatment give you the best chance for managing medical conditions that are more common for people who are older than age 65.  Falls are a major cause of broken bones and head injuries in people who are older than age 18. Take precautions to prevent a fall at home.  Work with your health care provider to learn what changes you can make to improve your health and wellness and to prevent falls. This information is not intended to replace advice given to you by your health care provider. Make sure you discuss any questions you have with your health care provider. Document Revised: 05/13/2018 Document Reviewed: 12/03/2016 Elsevier Patient Education  2021 [employer].

## 2020-03-12 ENCOUNTER — Other Ambulatory Visit: Payer: Self-pay | Admitting: Family Medicine

## 2020-03-13 ENCOUNTER — Other Ambulatory Visit: Payer: Self-pay

## 2020-03-13 ENCOUNTER — Ambulatory Visit
Admission: RE | Admit: 2020-03-13 | Discharge: 2020-03-13 | Disposition: A | Discharge status: Home / Self Care | Payer: Medicare Other | Source: Ambulatory Visit | Attending: Family Medicine | Admitting: Family Medicine

## 2020-03-13 DIAGNOSIS — Z1231 Encounter for screening mammogram for malignant neoplasm of breast: Secondary | ICD-10-CM | POA: Diagnosis not present

## 2020-03-13 NOTE — Telephone Encounter (Signed)
Pharmacy requests refill on: Amlodipine 2.5 mg   LAST REFILL: 11/29/2019 (Q-90, R-1) LAST OV: 02/22/2020  NEXT OV: Not Scheduled  PHARMACY: [facility]   Earliest Fill Date: 05/29/2020.

## 2020-03-16 ENCOUNTER — Ambulatory Visit (INDEPENDENT_AMBULATORY_CARE_PROVIDER_SITE_OTHER): Payer: Medicare Other | Admitting: Cardiology

## 2020-03-16 ENCOUNTER — Other Ambulatory Visit: Payer: Self-pay

## 2020-03-16 ENCOUNTER — Encounter: Payer: Self-pay | Admitting: Cardiology

## 2020-03-16 VITALS — BP 136/62 | HR 76 | Ht 65.5 in | Wt 164.0 lb

## 2020-03-16 DIAGNOSIS — I10 Essential (primary) hypertension: Secondary | ICD-10-CM | POA: Diagnosis not present

## 2020-03-16 DIAGNOSIS — E7800 Pure hypercholesterolemia, unspecified: Secondary | ICD-10-CM

## 2020-03-16 NOTE — Progress Notes (Signed)
Cardiology Office Note:    Date:  03/16/2020   ID:  [name], DOB [birth year], MRN [id number]  PCP:  Ria Bush, MD  [facility] Cardiologist:  Kate Sable, MD  [place] Electrophysiologist:  None   Referring MD: Ria Bush, MD   Chief Complaint  Patient presents with  . Other    3 month follow up. - Patient c.o rapid HR at night when she is trying to sleep. Meds reviewed verbally with patient.     History of Present Illness:    Kristine Owens is a 87 y.o. female with a hx of hypertension, hyperlipidemia who presents for follow-up.  Patient being seen for hypertension.  Hydralazine was previously stopped.  Blood pressures she states have been elevated although she usually checks her blood pressure when she is anxious.  She is worried about leaving her [place] acre land, this is caused her not to sleep well.  She sometimes checks her blood pressure right after exerting.  She otherwise feels okay.  Compliant with all her BP meds.   Past Medical History:  Diagnosis Date  . ERM OD (epiretinal membrane, right eye) 2015   also with R choroidal nevus; referred to Dr. Zigmund Daniel  . GERD (gastroesophageal reflux disease) 02/2000  . Hyperlipemia 07/2000  . Hypertension 01/2001  . Hypertensive retinopathy of both eyes    [facility]    Past Surgical History:  Procedure Laterality Date  . CATARACT EXTRACTION Bilateral 06/2016   [name]  . TONSILLECTOMY    . TOTAL ABDOMINAL HYSTERECTOMY  1981   w/BSO for fibroids (Dr. Jerene Pitch)    Current Medications: Current Meds  Medication Sig  . amLODipine (NORVASC) 2.5 MG tablet Take 1 tablet (2.5 mg total) by mouth at bedtime.  Marland Kitchen aspirin EC 81 MG tablet Take 1 tablet (81 mg total) by mouth daily.  Marland Kitchen atorvastatin (LIPITOR) 20 MG tablet Take 20 mg by mouth every other day.  . b complex vitamins tablet Take 1 tablet by mouth daily.  . carvedilol (COREG) 25 MG tablet Take 25 mg by mouth 2 (two) times daily with a  meal.  . cholecalciferol (VITAMIN D) 1000 UNITS tablet Take 1,000 Units by mouth daily.  . furosemide (LASIX) 20 MG tablet Take 10 mg by mouth daily.  . hydrochlorothiazide (HYDRODIURIL) 25 MG tablet Take 1 tablet (25 mg total) by mouth daily.  Marland Kitchen ibuprofen (ADVIL,MOTRIN) 200 MG tablet Take 200 mg by mouth every 6 (six) hours as needed.  . Multiple Vitamin (MULTIVITAMIN) tablet Take 1 tablet by mouth daily.  Marland Kitchen omeprazole (PRILOSEC OTC) 20 MG tablet Take 20 mg by mouth as needed.  . potassium chloride (KLOR-CON) 10 MEQ tablet TAKE 1 TABLET BY MOUTH EVERY DAY  . valsartan (DIOVAN) 320 MG tablet Take 1 tablet (320 mg total) by mouth daily.  Marland Kitchen zolpidem (AMBIEN) 5 MG tablet Take 1 tablet (5 mg total) by mouth at bedtime as needed for sleep.     Allergies:   Amlodipine, Codeine sulfate, Maxzide [hydrochlorothiazide w-triamterene], and Trazodone and nefazodone   Social History   Socioeconomic History  . Marital status: Widowed    Spouse name: Not on file  . Number of children: 1  . Years of education: Not on file  . Highest education level: Not on file  Occupational History  . Occupation: Studio//Artist//Teaches    Employer: RETIRED  Tobacco Use  . Smoking status: Never Smoker  . Smokeless tobacco: Never Used  Vaping Use  . Vaping Use: Never used  Substance and Sexual Activity  . Alcohol use: Yes    Alcohol/week: 14.0 standard drinks    Types: 14 Glasses of wine per week    Comment: red wine several times a week  . Drug use: No  . Sexual activity: Yes  Other Topics Concern  . 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  ROS:   Please see the history of present illness.     All other systems reviewed and are negative.  EKGs/Labs/Other Studies Reviewed:    The following studies were reviewed today:   EKG:  EKG ordered today.  EKG shows normal sinus rhythm.  Recent Labs: 10/20/2019: Magnesium 1.5 10/31/2019: Hemoglobin 13.3; Platelets 330 02/22/2020: ALT 23; BUN 13; Creatinine, Ser 0.82; Potassium 4.1; Sodium 137; TSH 2.15  Recent Lipid Panel    Component Value Date/Time   CHOL 213 (H) 02/22/2020 1234   TRIG 144.0 02/22/2020 1234   HDL 85.70 02/22/2020 1234   CHOLHDL 2 02/22/2020 1234   VLDL 28.8 02/22/2020 1234   LDLCALC 98 02/22/2020 1234   LDLDIRECT 121.3 12/16/2011 0837     Risk Assessment/Calculations:      Physical Exam:    VS:  BP 136/62 (BP Location: Left Arm, Patient Position: Sitting, Cuff Size: Normal)   Pulse 76   Ht 5' 5.5" (1.664 m)   Wt 164 lb (74.4 kg)   SpO2 97%   BMI 26.88 kg/m     Wt Readings from Last 3 Encounters:  03/16/20 164 lb (74.4 kg)  02/22/20 165 lb 5 oz (75 kg)  12/15/19 160 lb (72.6 kg)     GEN:  Well nourished, well developed in no acute distress HEENT: Normal NECK: No JVD; No carotid bruits LYMPHATICS: No lymphadenopathy CARDIAC: RRR, no murmurs, rubs, gallops RESPIRATORY:  Clear to auscultation without rales, wheezing or rhonchi  ABDOMEN: Soft, non-tender, non-distended MUSCULOSKELETAL:  No edema; No deformity  SKIN: Warm and dry NEUROLOGIC:  Alert and oriented x  3 PSYCHIATRIC:  Normal affect   ASSESSMENT:    1. Essential hypertension   2. Pure hypercholesterolemia    PLAN:    In order of problems listed above:  1. History of hypertension, BP controlled.  Continue HCTZ valsartan and amlodipine.  Patient counseled to check blood pressures after 5-10 minutes of resting/being calm. 2. History of hyperlipidemia, continue Lipitor .  Follow-up in 6 months.  Total encounter time 35 minutes  Greater than 50% was spent in counseling and coordination of care with the patient Education management    Medication Adjustments/Labs and Tests Ordered: Current medicines are reviewed at length with the patient today.  Concerns regarding medicines are outlined above.  Orders Placed This Encounter  Procedures  . EKG 12-Lead   No orders of the defined types were placed in this encounter.   Patient Instructions  Medication Instructions:  Your physician recommends that you continue on your current medications as directed. Please refer to the Current Medication list given to you today.  *If you need a refill on your cardiac medications before your next appointment, please call your pharmacy*   Lab Work: None ordered If you have labs (blood work) drawn today and your tests are completely normal, you will receive your results only by: Marland Kitchen MyChart Message (if you have MyChart) OR . A paper copy in the mail If you have any lab test that is abnormal or we need to change your treatment, we will call you to review the results.   Testing/Procedures: None orderd   Follow-Up: At [facility], you and your health needs are our priority.  As part of our continuing mission to provide you with exceptional heart care, we have created designated Provider Care Teams.  These Care Teams include your primary Cardiologist (physician) and Advanced Practice Providers (APPs -  Physician Assistants and Nurse Practitioners) who all work together to provide you with the care  you need, when you need it.  We recommend signing up for the patient portal called "MyChart".  Sign up information is provided on this After Visit Summary.  MyChart is used to connect with patients for Virtual Visits (Telemedicine).  Patients are able to view lab/test results, encounter notes, upcoming appointments, etc.  Non-urgent messages can be sent to your provider as well.   To learn more about what you can do with MyChart, go to [email or web address].    Your next appointment:   6 month(s)  The format for your next appointment:   In Person  Provider:   Kate Sable, MD   Other Instructions      Signed, Kate Sable, MD  03/16/2020 12:53 PM    [place]

## 2020-03-16 NOTE — Patient Instructions (Signed)
Medication Instructions:  Your physician recommends that you continue on your current medications as directed. Please refer to the Current Medication list given to you today.  *If you need a refill on your cardiac medications before your next appointment, please call your pharmacy*   Lab Work: None ordered If you have labs (blood work) drawn today and your tests are completely normal, you will receive your results only by: Marland Kitchen MyChart Message (if you have MyChart) OR . A paper copy in the mail If you have any lab test that is abnormal or we need to change your treatment, we will call you to review the results.   Testing/Procedures: None orderd   Follow-Up: At [facility], you and your health needs are our priority.  As part of our continuing mission to provide you with exceptional heart care, we have created designated Provider Care Teams.  These Care Teams include your primary Cardiologist (physician) and Advanced Practice Providers (APPs -  Physician Assistants and Nurse Practitioners) who all work together to provide you with the care you need, when you need it.  We recommend signing up for the patient portal called "MyChart".  Sign up information is provided on this After Visit Summary.  MyChart is used to connect with patients for Virtual Visits (Telemedicine).  Patients are able to view lab/test results, encounter notes, upcoming appointments, etc.  Non-urgent messages can be sent to your provider as well.   To learn more about what you can do with MyChart, go to [email or web address].    Your next appointment:   6 month(s)  The format for your next appointment:   In Person  Provider:   Kate Sable, MD   Other Instructions

## 2020-04-04 ENCOUNTER — Telehealth: Payer: Self-pay

## 2020-04-04 ENCOUNTER — Encounter: Payer: Self-pay | Admitting: Family Medicine

## 2020-04-04 DIAGNOSIS — H353130 Nonexudative age-related macular degeneration, bilateral, stage unspecified: Secondary | ICD-10-CM | POA: Insufficient documentation

## 2020-04-04 DIAGNOSIS — D3132 Benign neoplasm of left choroid: Secondary | ICD-10-CM | POA: Diagnosis not present

## 2020-04-04 DIAGNOSIS — H353132 Nonexudative age-related macular degeneration, bilateral, intermediate dry stage: Secondary | ICD-10-CM | POA: Diagnosis not present

## 2020-04-04 DIAGNOSIS — H40013 Open angle with borderline findings, low risk, bilateral: Secondary | ICD-10-CM | POA: Diagnosis not present

## 2020-04-04 DIAGNOSIS — H3554 Dystrophies primarily involving the retinal pigment epithelium: Secondary | ICD-10-CM | POA: Diagnosis not present

## 2020-04-04 DIAGNOSIS — H35373 Puckering of macula, bilateral: Secondary | ICD-10-CM | POA: Diagnosis not present

## 2020-04-04 MED ORDER — AMLODIPINE BESYLATE 2.5 MG PO TABS
2.5000 mg | ORAL_TABLET | Freq: Every day | ORAL | 3 refills | Status: DC
Start: 2020-04-04 — End: 2020-09-05

## 2020-04-04 NOTE — Telephone Encounter (Signed)
Received faxed refill request for amlodipine from [facility].  E-scribed refill.

## 2020-04-10 ENCOUNTER — Other Ambulatory Visit: Payer: Self-pay

## 2020-04-10 ENCOUNTER — Encounter (INDEPENDENT_AMBULATORY_CARE_PROVIDER_SITE_OTHER): Payer: Medicare Other | Admitting: Ophthalmology

## 2020-04-10 DIAGNOSIS — H33301 Unspecified retinal break, right eye: Secondary | ICD-10-CM | POA: Diagnosis not present

## 2020-04-10 DIAGNOSIS — D3132 Benign neoplasm of left choroid: Secondary | ICD-10-CM | POA: Diagnosis not present

## 2020-04-10 DIAGNOSIS — H35373 Puckering of macula, bilateral: Secondary | ICD-10-CM

## 2020-04-10 DIAGNOSIS — I10 Essential (primary) hypertension: Secondary | ICD-10-CM | POA: Diagnosis not present

## 2020-04-10 DIAGNOSIS — H35033 Hypertensive retinopathy, bilateral: Secondary | ICD-10-CM | POA: Diagnosis not present

## 2020-04-10 DIAGNOSIS — H353132 Nonexudative age-related macular degeneration, bilateral, intermediate dry stage: Secondary | ICD-10-CM | POA: Diagnosis not present

## 2020-04-10 DIAGNOSIS — H43813 Vitreous degeneration, bilateral: Secondary | ICD-10-CM

## 2020-04-26 ENCOUNTER — Other Ambulatory Visit: Payer: Self-pay | Admitting: Family Medicine

## 2020-04-26 DIAGNOSIS — I10 Essential (primary) hypertension: Secondary | ICD-10-CM

## 2020-04-26 NOTE — Telephone Encounter (Signed)
Kristine Owens, This is your pt. Refill came to me.

## 2020-05-26 ENCOUNTER — Other Ambulatory Visit: Payer: Self-pay | Admitting: Family Medicine

## 2020-05-27 IMAGING — MG DIGITAL SCREENING BILATERAL MAMMOGRAM WITH TOMO AND CAD
8 series · 8 of 24 positions shown · non-contrast
Comparison: Previous exam(s).

CLINICAL DATA: Screening.

EXAM:
DIGITAL SCREENING BILATERAL MAMMOGRAM WITH TOMO AND CAD

[L CC synth-2D]
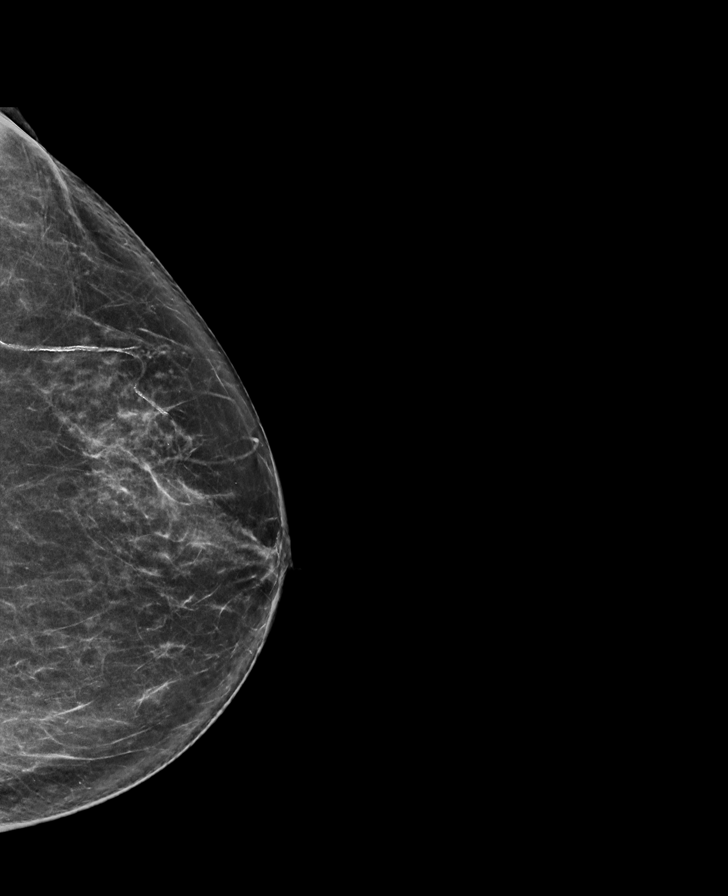

[L MLO synth-2D]
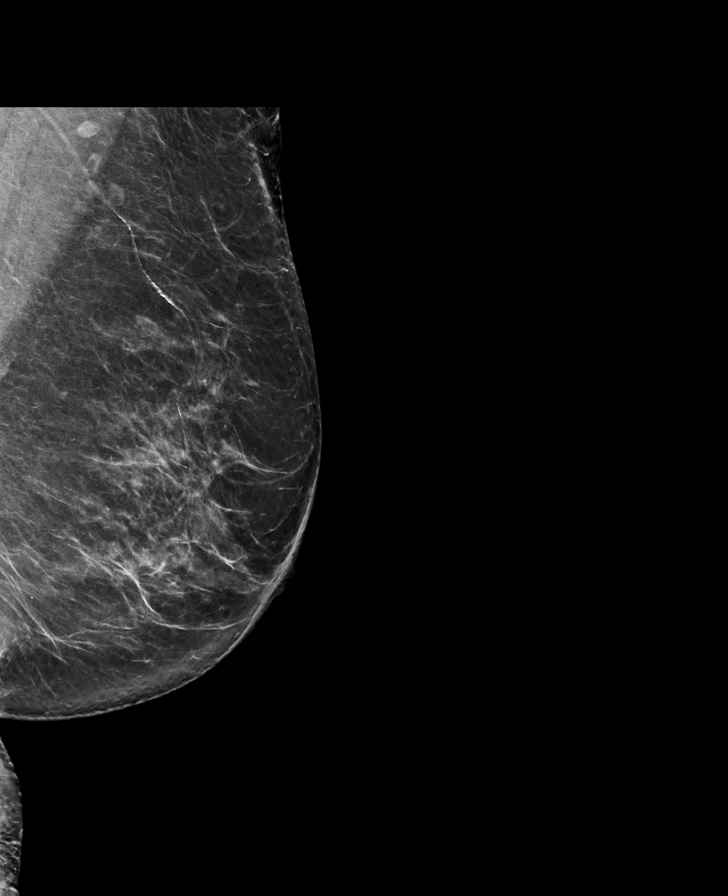

[R MLO synth-2D]
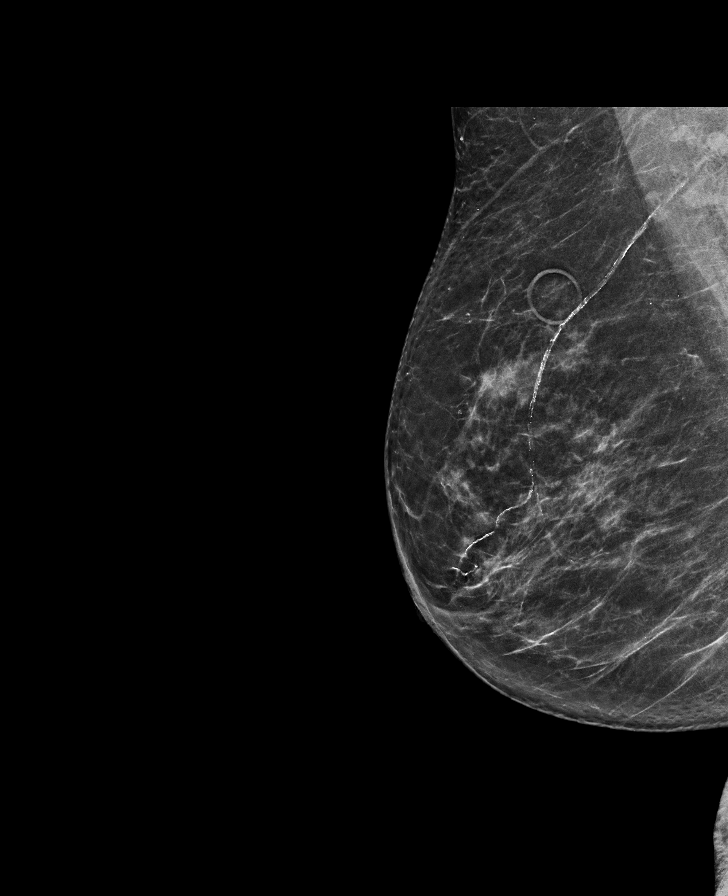

[R CC synth-2D]
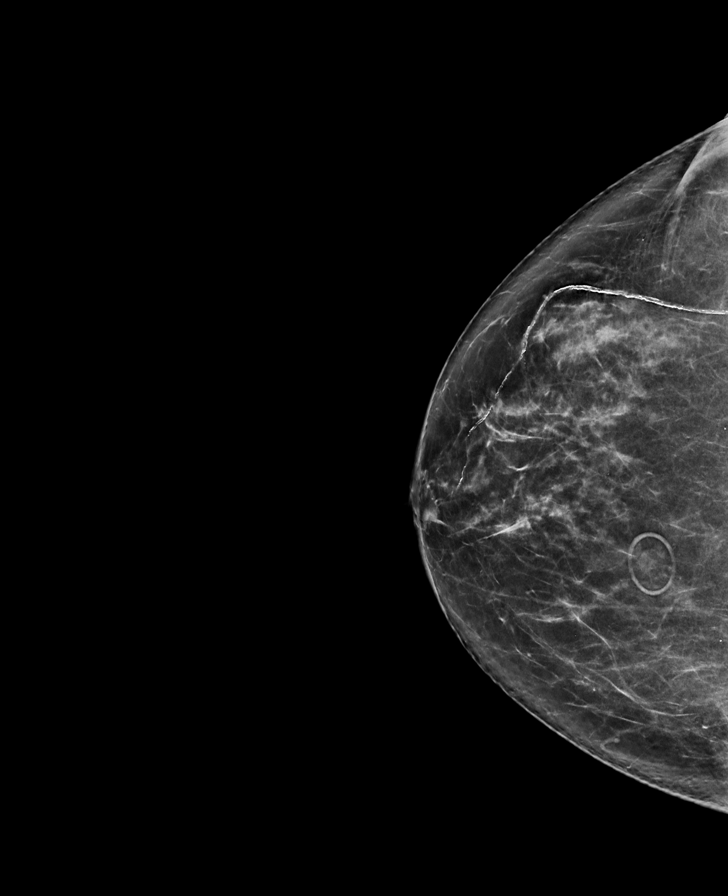

[R MLO tomo · tomo slice 37/73.0]
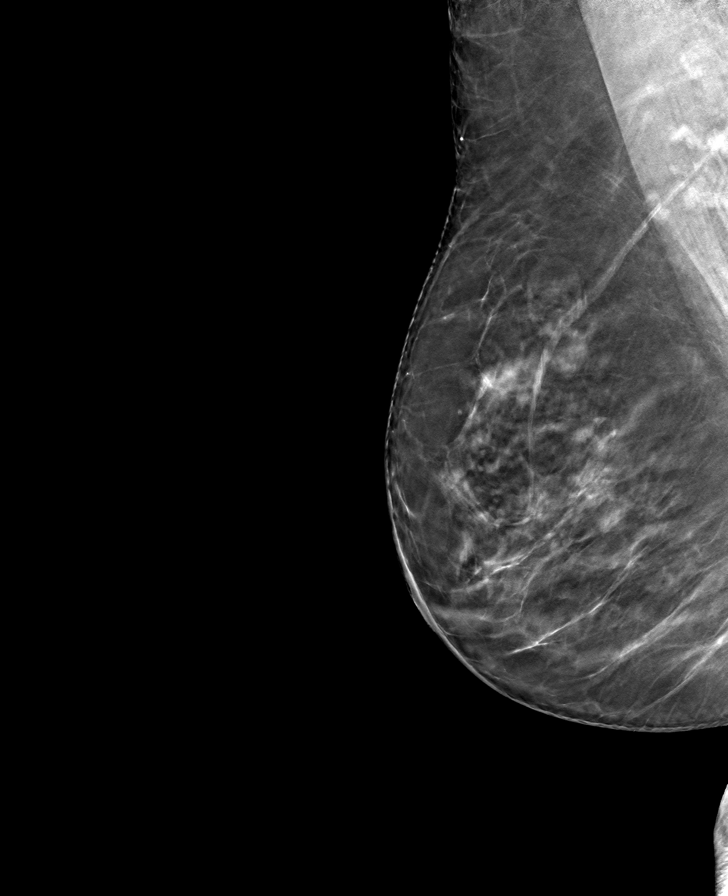

[L MLO tomo · tomo slice 41/81.0]
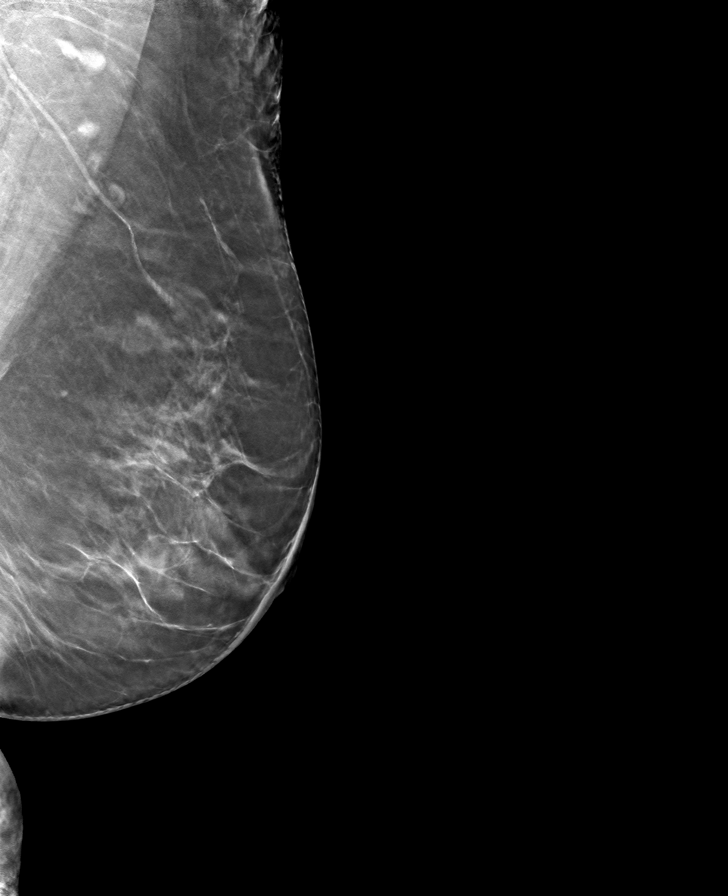

[L CC tomo · tomo slice 37/73.0]
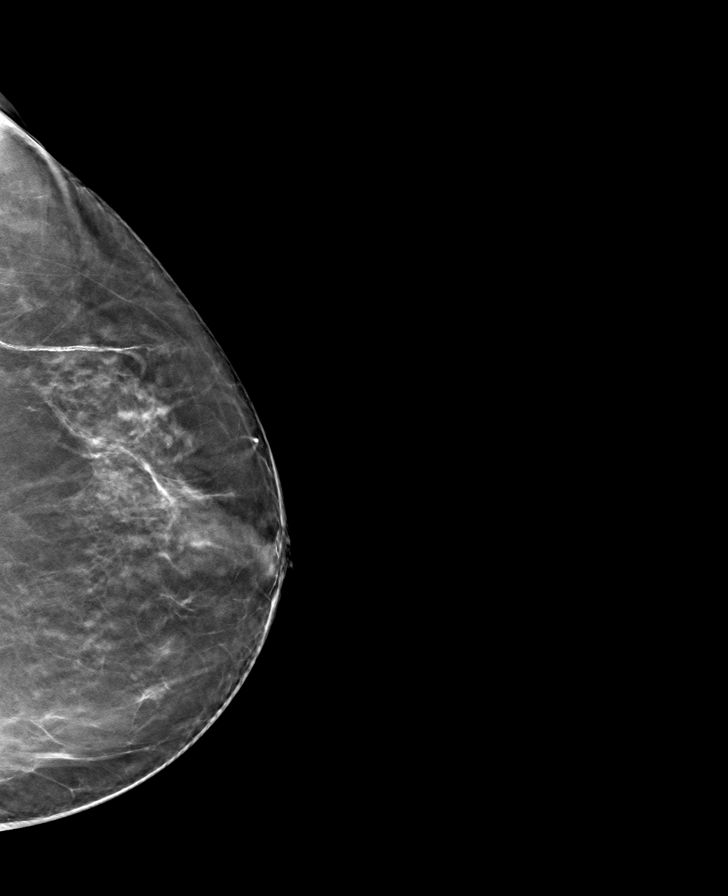

[R CC tomo · tomo slice 38/75.0]
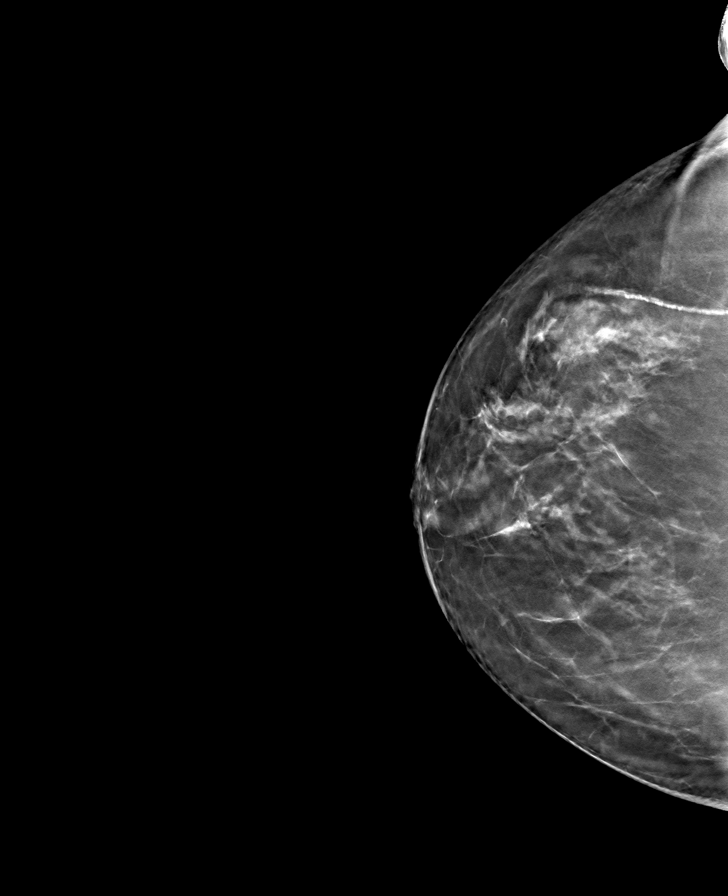

[8 of 24 positions shown; findings below may reference images not displayed]

ACR Breast Density Category c: The breast tissue is heterogeneously
dense, which may obscure small masses.
FINDINGS: In the right breast, a possible asymmetry warrants further
evaluation. In the left breast, no findings suspicious for
malignancy. Images were processed with CAD.
IMPRESSION: Further evaluation is suggested for possible asymmetry in the right
breast.

RECOMMENDATION:
Diagnostic mammogram and possibly ultrasound of the right breast.
(Code:EU-[id number])

The patient will be contacted regarding the findings, and additional
imaging will be scheduled.

BI-RADS CATEGORY  0: Incomplete. Need additional imaging evaluation
and/or prior mammograms for comparison.

## 2020-09-05 ENCOUNTER — Other Ambulatory Visit: Payer: Self-pay | Admitting: Family Medicine

## 2020-09-11 ENCOUNTER — Telehealth: Payer: Self-pay | Admitting: *Deleted

## 2020-09-11 NOTE — Telephone Encounter (Signed)
Patient scheduled for a virtual visit tomorrow 09/12/20 at 9:00 am with Romilda Garret NP.Marland Kitchen Patient was given CDC's guideline on isolation. Patient was given ER precautions again and she verbalized understanding.

## 2020-09-11 NOTE — Telephone Encounter (Signed)
Noted. Thanks to [facility] for seeing this nice patient.

## 2020-09-11 NOTE — Telephone Encounter (Signed)
Patient called stating that she started feeling bad last night and tested positive for covid. Patient stated that she has had a sinus headache,  nasal congestion, feels achy and fever of 100.0. Patient denies a cough, sore throat, SOB or difficulty breathing. Patent stated that she has been taking tylenol for fever. Patient stated that she would like to see about starting Paxlovid. Patient was advised that she needs to rest, drink lots of fluids and eat well-balanced meals. Patient was given ER precautions and she verbalized understanding.

## 2020-09-12 ENCOUNTER — Telehealth (INDEPENDENT_AMBULATORY_CARE_PROVIDER_SITE_OTHER): Payer: Medicare Other | Admitting: Nurse Practitioner

## 2020-09-12 ENCOUNTER — Encounter: Payer: Self-pay | Admitting: Nurse Practitioner

## 2020-09-12 ENCOUNTER — Other Ambulatory Visit: Payer: Self-pay

## 2020-09-12 VITALS — BP 142/71 | Temp 99.9°F | Ht 64.75 in | Wt 162.0 lb

## 2020-09-12 DIAGNOSIS — U071 COVID-19: Secondary | ICD-10-CM | POA: Insufficient documentation

## 2020-09-12 HISTORY — DX: COVID-19: U07.1

## 2020-09-12 MED ORDER — MOLNUPIRAVIR EUA 200MG CAPSULE: 4.0000 | ORAL_CAPSULE | Freq: Two times a day (BID) | ORAL | 0 refills | Status: AC

## 2020-09-12 NOTE — Progress Notes (Signed)
Patient ID: Kristine Owens, female    DOB: [birth year], 87 y.o.   MRN: [id number]  Virtual visit completed through [place], a video enabled telemedicine application. Due to national recommendations of social distancing due to COVID-19, a virtual visit is felt to be most appropriate for this patient at this time. Reviewed limitations, risks, security and privacy concerns of performing a virtual visit and the availability of in person appointments. I also reviewed that there may be a patient responsible charge related to this service. The patient agreed to proceed.   Patient location: home Provider location: [place] at [facility], office Persons participating in this virtual visit: patient, provider   If any vitals were documented, they were collected by patient at home unless specified below.    BP (!) 142/71 Comment: per patient  Temp 99.9 F (37.7 C) Comment: per patient  Ht 5' 4.75" (1.645 m)   Wt 162 lb (73.5 kg) Comment: per patient  BMI 27.17 kg/m    CC: Covid 19 infection  Subjective:   HPI: Kristine Owens is a 87 y.o. female presenting on 09/12/2020 for Covid Positive (On 09/10/20 with home test kit. Symptoms started on 09/10/20. Symptoms are head congestion, sinus pressure, congestion. No sore throat.) She has been using tylenol to help with her fever and body aches. She has also been drinking plenty of fluid. She has been vaccinated against covid and has had one booster.   Relevant past medical, surgical, family and social history reviewed and updated as indicated. Interim medical history since our last visit reviewed. Allergies and medications reviewed and updated. Outpatient Medications Prior to Visit  Medication Sig Dispense Refill   amLODipine (NORVASC) 2.5 MG tablet TAKE 1 TABLET BY MOUTH AT BEDTIME. 90 tablet 1   aspirin EC 81 MG tablet Take 1 tablet (81 mg total) by mouth daily.     atorvastatin (LIPITOR) 20 MG tablet Take 20 mg by mouth every other day.      b complex vitamins tablet Take 1 tablet by mouth daily.     carvedilol (COREG) 25 MG tablet Take 25 mg by mouth 2 (two) times daily with a meal.     cholecalciferol (VITAMIN D) 1000 UNITS tablet Take 1,000 Units by mouth daily.     hydrochlorothiazide (MICROZIDE) 12.5 MG capsule Take 12.5 mg by mouth daily.     ibuprofen (ADVIL,MOTRIN) 200 MG tablet Take 200 mg by mouth every 6 (six) hours as needed.     Multiple Vitamin (MULTIVITAMIN) tablet Take 1 tablet by mouth daily.     omeprazole (PRILOSEC OTC) 20 MG tablet Take 20 mg by mouth as needed.     potassium chloride (KLOR-CON) 10 MEQ tablet TAKE 1 TABLET BY MOUTH EVERY DAY 90 tablet 1   valsartan (DIOVAN) 320 MG tablet TAKE 1 TABLET BY MOUTH EVERY DAY 90 tablet 3   furosemide (LASIX) 20 MG tablet TAKE 1/2 TABLET BY MOUTH DAILY 45 tablet 2   hydrochlorothiazide (HYDRODIURIL) 25 MG tablet Take 1 tablet (25 mg total) by mouth daily.     zolpidem (AMBIEN) 5 MG tablet Take 1 tablet (5 mg total) by mouth at bedtime as needed for sleep. 15 tablet 0   No facility-administered medications prior to visit.     Per HPI unless specifically indicated in ROS section below Review of Systems  Constitutional:  Positive for fatigue and fever. Negative for chills.  HENT:  Positive for congestion and sinus pressure.   Respiratory:  Positive for cough.  Negative for shortness of breath.   Cardiovascular:  Negative for chest pain.  Gastrointestinal:  Negative for abdominal pain, diarrhea, nausea and vomiting.  Musculoskeletal:  Positive for myalgias.  Objective:  BP (!) 142/71 Comment: per patient  Temp 99.9 F (37.7 C) Comment: per patient  Ht 5' 4.75" (1.645 m)   Wt 162 lb (73.5 kg) Comment: per patient  BMI 27.17 kg/m   Wt Readings from Last 3 Encounters:  09/12/20 162 lb (73.5 kg)  03/16/20 164 lb (74.4 kg)  02/22/20 165 lb 5 oz (75 kg)       Physical exam: Gen: alert, NAD, not ill appearing Pulm: speaks in complete sentences without  increased work of breathing Psych: normal mood, normal thought content      Results for orders placed or performed in visit on 02/22/20  Lipid panel  Result Value Ref Range   Cholesterol 213 (H) 0 - 200 mg/dL   Triglycerides 144.0 0.0 - 149.0 mg/dL   HDL 85.70 >39.00 mg/dL   VLDL 28.8 0.0 - 40.0 mg/dL   LDL Cholesterol 98 0 - 99 mg/dL   Total CHOL/HDL Ratio 2    NonHDL 127.16   Comprehensive metabolic panel  Result Value Ref Range   Sodium 137 135 - 145 mEq/L   Potassium 4.1 3.5 - 5.1 mEq/L   Chloride 103 96 - 112 mEq/L   CO2 28 19 - 32 mEq/L   Glucose, Bld 83 70 - 99 mg/dL   BUN 13 6 - 23 mg/dL   Creatinine, Ser 0.82 0.40 - 1.20 mg/dL   Total Bilirubin 0.5 0.2 - 1.2 mg/dL   Alkaline Phosphatase 78 39 - 117 U/L   AST 17 0 - 37 U/L   ALT 23 0 - 35 U/L   Total Protein 6.2 6.0 - 8.3 g/dL   Albumin 4.2 3.5 - 5.2 g/dL   GFR 64.43 >60.00 mL/min   Calcium 9.3 8.4 - 10.5 mg/dL  Hemoglobin A1c  Result Value Ref Range   Hgb A1c MFr Bld 5.3 4.6 - 6.5 %  TSH  Result Value Ref Range   TSH 2.15 0.35 - 4.50 uIU/mL   Assessment & Plan:   Problem List Items Addressed This Visit       Other   COVID-19 virus infection - Primary    Discussed treatment options and given that she is on a statin and CCB we opted for Molnupiravir. Sent to pharmacy of request. Instructed to begin immediately. She was given S&S to watch out for and when to seek UC and ED level care.        Relevant Medications   molnupiravir EUA 200 mg CAPS     No orders of the defined types were placed in this encounter.  No orders of the defined types were placed in this encounter.   I discussed the assessment and treatment plan with the patient. The patient was provided an opportunity to ask questions and all were answered. The patient agreed with the plan and demonstrated an understanding of the instructions. The patient was advised to call back or seek an in-person evaluation if the symptoms worsen or if the  condition fails to improve as anticipated.  Follow up plan: No follow-ups on file.  Romilda Garret, NP

## 2020-09-12 NOTE — Patient Instructions (Signed)
I sent the medication to pharmacy on file Begin taking it immediately Call with any questions or concerns

## 2020-09-12 NOTE — Assessment & Plan Note (Signed)
Discussed treatment options and given that she is on a statin and CCB we opted for Molnupiravir. Sent to pharmacy of request. Instructed to begin immediately. She was given S&S to watch out for and when to seek UC and ED level care.

## 2020-09-14 NOTE — Telephone Encounter (Signed)
Spoke with patient.  She says she is feeling some better this pm but will continue to push fluids.  I did review ER precautions with patient and she verbalizes understanding and will call us Monday with an update.

## 2020-09-14 NOTE — Telephone Encounter (Signed)
Pt returning call.  States she feels lightheaded, weak and has a little diarrhea.  Denies any fever, cough or SOB.

## 2020-09-14 NOTE — Telephone Encounter (Addendum)
Recent COVID dx started on molnupiravir, plz call for update on COVID symptoms.

## 2020-09-14 NOTE — Telephone Encounter (Signed)
Recommend push small sips of fluids over weekend, update [place] Monday with how she's doing. If severe lightheaded/dizziness and unable to keep up with PO intake, would give ER precautions as may need IVF.

## 2020-09-14 NOTE — Telephone Encounter (Signed)
Lvm askig pt to call back.  Need an update on sxs.

## 2020-09-17 NOTE — Telephone Encounter (Signed)
Pt said she is a little tired; pt feels more tired in the mornings than the afternoon; no fever, and no diarrhea now. No lightheadedness or dizziness.pt still positive for covid on home test on 09/16/20.pt is drinking plenty of fluids and pt is eating well. Pt is seeing improvement since finished med. Sending note to DR G and pt request cb with Dr Synthia Innocent comments or instructions.

## 2020-09-17 NOTE — Telephone Encounter (Signed)
Sounds like she's making a steady recovery. Recommend continue the course - good fluid throughout the day, continue plenty of rest.

## 2020-09-18 NOTE — Telephone Encounter (Signed)
Spoke with pt relaying Dr. G's message. Pt verbalizes understanding.  

## 2020-09-20 ENCOUNTER — Ambulatory Visit: Payer: Medicare Other | Admitting: Cardiology

## 2020-10-18 DIAGNOSIS — H40013 Open angle with borderline findings, low risk, bilateral: Secondary | ICD-10-CM | POA: Diagnosis not present

## 2020-10-23 DIAGNOSIS — D485 Neoplasm of uncertain behavior of skin: Secondary | ICD-10-CM | POA: Diagnosis not present

## 2020-10-23 DIAGNOSIS — L570 Actinic keratosis: Secondary | ICD-10-CM | POA: Diagnosis not present

## 2020-10-23 DIAGNOSIS — D225 Melanocytic nevi of trunk: Secondary | ICD-10-CM | POA: Diagnosis not present

## 2020-10-23 DIAGNOSIS — L821 Other seborrheic keratosis: Secondary | ICD-10-CM | POA: Diagnosis not present

## 2020-10-23 DIAGNOSIS — L814 Other melanin hyperpigmentation: Secondary | ICD-10-CM | POA: Diagnosis not present

## 2020-10-23 DIAGNOSIS — L989 Disorder of the skin and subcutaneous tissue, unspecified: Secondary | ICD-10-CM | POA: Diagnosis not present

## 2020-11-01 ENCOUNTER — Other Ambulatory Visit: Payer: Self-pay

## 2020-11-01 ENCOUNTER — Ambulatory Visit (INDEPENDENT_AMBULATORY_CARE_PROVIDER_SITE_OTHER): Payer: Medicare Other | Admitting: Cardiology

## 2020-11-01 ENCOUNTER — Encounter: Payer: Self-pay | Admitting: Cardiology

## 2020-11-01 VITALS — BP 142/74 | HR 64 | Ht 66.0 in | Wt 163.0 lb

## 2020-11-01 DIAGNOSIS — E7800 Pure hypercholesterolemia, unspecified: Secondary | ICD-10-CM

## 2020-11-01 DIAGNOSIS — I10 Essential (primary) hypertension: Secondary | ICD-10-CM | POA: Diagnosis not present

## 2020-11-01 MED ORDER — AMLODIPINE BESYLATE 5 MG PO TABS: 5.0000 mg | ORAL_TABLET | Freq: Every day | ORAL | 5 refills | Status: DC

## 2020-11-01 NOTE — Patient Instructions (Signed)
Medication Instructions:   Your physician has recommended you make the following change in your medication:    INCREASE your Amlodipine (Norvasc) to 5 MG once a day.   *If you need a refill on your cardiac medications before your next appointment, please call your pharmacy*   Lab Work: None ordered If you have labs (blood work) drawn today and your tests are completely normal, you will receive your results only by: [facility] (if you have MyChart) OR A paper copy in the mail If you have any lab test that is abnormal or we need to change your treatment, we will call you to review the results.   Testing/Procedures: None ordered   Follow-Up: At [facility], you and your health needs are our priority.  As part of our continuing mission to provide you with exceptional heart care, we have created designated Provider Care Teams.  These Care Teams include your primary Cardiologist (physician) and Advanced Practice Providers (APPs -  Physician Assistants and Nurse Practitioners) who all work together to provide you with the care you need, when you need it.  We recommend signing up for the patient portal called "MyChart".  Sign up information is provided on this After Visit Summary.  MyChart is used to connect with patients for Virtual Visits (Telemedicine).  Patients are able to view lab/test results, encounter notes, upcoming appointments, etc.  Non-urgent messages can be sent to your provider as well.   To learn more about what you can do with MyChart, go to [email or web address].    Your next appointment:   6 month(s)  The format for your next appointment:   In Person  Provider:   Kate Sable, MD   Other Instructions

## 2020-11-01 NOTE — Progress Notes (Signed)
Cardiology Office Note:    Date:  11/01/2020   ID:  Kristine Owens, DOB [birth year], MRN [id number]  PCP:  Ria Bush, MD  [facility] Cardiologist:  Kate Sable, MD  [place] Electrophysiologist:  None   Referring MD: Ria Bush, MD   Chief Complaint  Patient presents with   Other    6 month follow up. Meds reviewed verbally with patient.     History of Present Illness:    Kristine Owens is a 87 y.o. female with a hx of hypertension, hyperlipidemia who presents for follow-up.  Patient being seen for hypertension.  Hydralazine was previously stopped.  Checks BP frequently at home, systolics in the 086V occasionally 180s.  Takes all her meds as prescribed.  Has no other concerns at this time.   Past Medical History:  Diagnosis Date   ERM OD (epiretinal membrane, right eye) 2015   also with R choroidal nevus; referred to Dr. Zigmund Daniel   GERD (gastroesophageal reflux disease) 02/2000   Hyperlipemia 07/2000   Hypertension 2001-01-28   Hypertensive retinopathy of both eyes    [facility]    Past Surgical History:  Procedure Laterality Date   CATARACT EXTRACTION Bilateral 06/2016   [name]   TONSILLECTOMY     TOTAL ABDOMINAL HYSTERECTOMY  1981   w/BSO for fibroids (Dr. Jerene Pitch)    Current Medications: Current Meds  Medication Sig   aspirin EC 81 MG tablet Take 1 tablet (81 mg total) by mouth daily.   b complex vitamins tablet Take 1 tablet by mouth daily.   carvedilol (COREG) 25 MG tablet Take 25 mg by mouth 2 (two) times daily with a meal.   cholecalciferol (VITAMIN D) 1000 UNITS tablet Take 1,000 Units by mouth daily.   hydrochlorothiazide (MICROZIDE) 12.5 MG capsule Take 12.5 mg by mouth 3 (three) times a week.   ibuprofen (ADVIL,MOTRIN) 200 MG tablet Take 200 mg by mouth every 6 (six) hours as needed.   Multiple Vitamin (MULTIVITAMIN) tablet Take 1 tablet by mouth daily.   omeprazole (PRILOSEC OTC) 20 MG tablet Take 20 mg by mouth as  needed.   potassium chloride (KLOR-CON) 10 MEQ tablet TAKE 1 TABLET BY MOUTH EVERY DAY   valsartan (DIOVAN) 320 MG tablet TAKE 1 TABLET BY MOUTH EVERY DAY   [DISCONTINUED] amLODipine (NORVASC) 2.5 MG tablet TAKE 1 TABLET BY MOUTH AT BEDTIME.     Allergies:   Amlodipine, Codeine sulfate, Maxzide [hydrochlorothiazide w-triamterene], and Trazodone and nefazodone   Social History   Socioeconomic History   Marital status: Widowed    Spouse name: Not on file   Number of children: 1   Years of education: Not on file   Highest education level: Not on file  Occupational History   Occupation: Studio//Artist//Teaches    Employer: RETIRED  Tobacco Use   Smoking status: Never   Smokeless tobacco: Never  Vaping Use   Vaping Use: Never used  Substance and Sexual Activity   Alcohol use: Yes    Alcohol/week: 14.0 standard drinks    Types: 14 Glasses of wine per week    Comment: red wine several times a week   Drug use: No   Sexual activity: Yes  Other Topics Concern   Not on file  Social History Narrative   "[name]"   Married/remarried   Husband with dx creutzfeldt-jakob prion disease 2017 then memory unit of nursing home - passed away January 29, 2016   One daughter, local   Occupation: retired   Doctor, 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  ROS:   Please see the history of present  illness.     All other systems reviewed and are negative.  EKGs/Labs/Other Studies Reviewed:    The following studies were reviewed today:   EKG:  EKG ordered today.  EKG shows normal sinus rhythm.  Recent Labs: 02/22/2020: ALT 23; BUN 13; Creatinine, Ser 0.82; Potassium 4.1; Sodium 137; TSH 2.15  Recent Lipid Panel    Component Value Date/Time   CHOL 213 (H) 02/22/2020 1234   TRIG 144.0 02/22/2020 1234   HDL 85.70 02/22/2020 1234   CHOLHDL 2 02/22/2020 1234   VLDL 28.8 02/22/2020 1234   LDLCALC 98 02/22/2020 1234   LDLDIRECT 121.3 12/16/2011 0837     Risk Assessment/Calculations:      Physical Exam:    VS:  BP (!) 142/74 (BP Location: Left Arm, Patient Position: Sitting, Cuff Size: Normal)   Pulse 64   Ht 5\' 6"  (1.676 m)   Wt 163 lb (73.9 kg)   SpO2 98%   BMI 26.31 kg/m     Wt Readings from Last 3 Encounters:  11/01/20 163 lb (73.9 kg)  09/12/20 162 lb (73.5 kg)  03/16/20 164 lb (74.4 kg)     GEN:  Well nourished, well developed in no acute distress HEENT: Normal NECK: No JVD; No carotid bruits LYMPHATICS: No lymphadenopathy CARDIAC: RRR, no murmurs, rubs, gallops RESPIRATORY:  Clear to auscultation without rales, wheezing or rhonchi  ABDOMEN: Soft, non-tender, non-distended MUSCULOSKELETAL:  No edema; No deformity  SKIN: Warm and dry NEUROLOGIC:  Alert and oriented x 3 PSYCHIATRIC:  Normal affect   ASSESSMENT:    1. Primary hypertension   2. Pure hypercholesterolemia     PLAN:    In order of problems listed above:  History of hypertension, BP elevated.  Increase amlodipine to 5 mg daily.  Continue Coreg, valsartan.  History of hyperlipidemia, low-cholesterol diet advised.  Due to age, no significant benefit from a statin.  Follow-up yearly.    Total encounter time 35 minutes  Greater than 50% was spent in counseling and coordination of care with the patient Education management    Medication Adjustments/Labs and Tests Ordered: Current  medicines are reviewed at length with the patient today.  Concerns regarding medicines are outlined above.  Orders Placed This Encounter  Procedures   EKG 12-Lead    Meds ordered this encounter  Medications   amLODipine (NORVASC) 5 MG tablet    Sig: Take 1 tablet (5 mg total) by mouth at bedtime.    Dispense:  30 tablet    Refill:  5     Patient Instructions  Medication Instructions:   Your physician has recommended you make the following change in your medication:    INCREASE your Amlodipine (Norvasc) to 5 MG once a day.   *If you need a refill on  your cardiac medications before your next appointment, please call your pharmacy*   Lab Work: None ordered If you have labs (blood work) drawn today and your tests are completely normal, you will receive your results only by: [place] (if you have MyChart) OR A paper copy in the mail If you have any lab test that is abnormal or we need to change your treatment, we will call you to review the results.   Testing/Procedures: None ordered   Follow-Up: At [facility], you and your health needs are our priority.  As part of our continuing mission to provide you with exceptional heart care, we have created designated Provider Care Teams.  These Care Teams include your primary Cardiologist (physician) and Advanced Practice Providers (APPs -  Physician Assistants and Nurse Practitioners) who all work together to provide you with the care you need, when you need it.  We recommend signing up for the patient portal called "MyChart".  Sign up information is provided on this After Visit Summary.  MyChart is used to connect with patients for Virtual Visits (Telemedicine).  Patients are able to view lab/test results, encounter notes, upcoming appointments, etc.  Non-urgent messages can be sent to your provider as well.   To learn more about what you can do with MyChart, go to [email or web address].    Your next appointment:   6  month(s)  The format for your next appointment:   In Person  Provider:   Kate Sable, MD   Other Instructions    Signed, Kate Sable, MD  11/01/2020 1:04 PM    [place]

## 2020-11-23 DIAGNOSIS — Z23 Encounter for immunization: Secondary | ICD-10-CM | POA: Diagnosis not present

## 2020-11-25 ENCOUNTER — Other Ambulatory Visit: Payer: Self-pay | Admitting: Family Medicine

## 2020-11-28 NOTE — Telephone Encounter (Addendum)
Received refill request for Coreg 25 mg twice a day. Last office note 02/22/20 shows 0.5 twice a day. Patient saw cardiology 11/01/20.  Please confirm correct directions.

## 2020-12-04 DIAGNOSIS — L570 Actinic keratosis: Secondary | ICD-10-CM | POA: Diagnosis not present

## 2021-01-29 ENCOUNTER — Other Ambulatory Visit: Payer: Self-pay | Admitting: Family Medicine

## 2021-01-29 DIAGNOSIS — Z1231 Encounter for screening mammogram for malignant neoplasm of breast: Secondary | ICD-10-CM

## 2021-03-14 ENCOUNTER — Ambulatory Visit
Admission: RE | Admit: 2021-03-14 | Discharge: 2021-03-14 | Disposition: A | Discharge status: Home / Self Care | Payer: Medicare Other | Source: Ambulatory Visit | Attending: Family Medicine | Admitting: Family Medicine

## 2021-03-14 DIAGNOSIS — Z1231 Encounter for screening mammogram for malignant neoplasm of breast: Secondary | ICD-10-CM

## 2021-04-16 ENCOUNTER — Encounter (INDEPENDENT_AMBULATORY_CARE_PROVIDER_SITE_OTHER): Payer: Medicare Other | Admitting: Ophthalmology

## 2021-04-16 ENCOUNTER — Other Ambulatory Visit: Payer: Self-pay

## 2021-04-16 DIAGNOSIS — H43813 Vitreous degeneration, bilateral: Secondary | ICD-10-CM

## 2021-04-16 DIAGNOSIS — H35033 Hypertensive retinopathy, bilateral: Secondary | ICD-10-CM | POA: Diagnosis not present

## 2021-04-16 DIAGNOSIS — H353132 Nonexudative age-related macular degeneration, bilateral, intermediate dry stage: Secondary | ICD-10-CM

## 2021-04-16 DIAGNOSIS — I10 Essential (primary) hypertension: Secondary | ICD-10-CM | POA: Diagnosis not present

## 2021-04-16 DIAGNOSIS — D3132 Benign neoplasm of left choroid: Secondary | ICD-10-CM | POA: Diagnosis not present

## 2021-04-16 DIAGNOSIS — H35373 Puckering of macula, bilateral: Secondary | ICD-10-CM | POA: Diagnosis not present

## 2021-04-25 ENCOUNTER — Ambulatory Visit (INDEPENDENT_AMBULATORY_CARE_PROVIDER_SITE_OTHER): Payer: Medicare Other | Admitting: Cardiology

## 2021-04-25 ENCOUNTER — Encounter: Payer: Self-pay | Admitting: Cardiology

## 2021-04-25 ENCOUNTER — Other Ambulatory Visit: Payer: Self-pay

## 2021-04-25 VITALS — BP 146/70 | HR 62 | Ht 65.5 in | Wt 165.0 lb

## 2021-04-25 DIAGNOSIS — F419 Anxiety disorder, unspecified: Secondary | ICD-10-CM

## 2021-04-25 DIAGNOSIS — I10 Essential (primary) hypertension: Secondary | ICD-10-CM | POA: Diagnosis not present

## 2021-04-25 DIAGNOSIS — E7800 Pure hypercholesterolemia, unspecified: Secondary | ICD-10-CM

## 2021-04-25 MED ORDER — HYDROCHLOROTHIAZIDE 12.5 MG PO CAPS: 12.5000 mg | ORAL_CAPSULE | Freq: Every day | ORAL | 5 refills | Status: DC

## 2021-04-25 NOTE — Progress Notes (Signed)
?Cardiology Office Note:   ? ?Date:  04/25/2021  ? ?ID:  Kristine Owens, DOB 01/31/1931, MRN [id number] ? ?PCP:  Kristine Bush, MD  ?[place] Cardiologist:  Kristine Sable, MD  ?[facility] Electrophysiologist:  None  ? ?Referring MD: Kristine Bush, MD  ? ?Chief Complaint  ?Patient presents with  ? OTher  ?  6 month follow up -- Meds reviewed verbally with patient.   ? ? ?History of Present Illness:   ? ?Kristine Owens is a 88 y.o. female with a hx of hypertension, hyperlipidemia who presents for follow-up. ? ?Previously seen for follow-up of hypertension, takes Coreg, valsartan, amlodipine 5 mg daily.  Overall has felt okay, although her blood pressures have been elevated of late running in the 761P to 509T systolic.  She states worrying a lot, recently lost her husband in 2018.  Trying to figure out what to do with her properties.  Takes medications as prescribed.  She states higher doses of amlodipine previously caused leg edema. ? ? ? ?Past Medical History:  ?Diagnosis Date  ? ERM OD (epiretinal membrane, right eye) 2015  ? also with R choroidal nevus; referred to Dr. Zigmund Daniel  ? GERD (gastroesophageal reflux disease) 02/2000  ? Hyperlipemia 07/2000  ? Hypertension 01/2001  ? Hypertensive retinopathy of both eyes   ? [facility]  ? ? ?Past Surgical History:  ?Procedure Laterality Date  ? CATARACT EXTRACTION Bilateral 06/2016  ? [name]  ? TONSILLECTOMY    ? TOTAL ABDOMINAL HYSTERECTOMY  1981  ? w/BSO for fibroids (Dr. Jerene Pitch)  ? ? ?Current Medications: ?Current Meds  ?Medication Sig  ? amLODipine (NORVASC) 5 MG tablet Take 1 tablet (5 mg total) by mouth at bedtime.  ? aspirin EC 81 MG tablet Take 1 tablet (81 mg total) by mouth daily.  ? b complex vitamins tablet Take 1 tablet by mouth daily.  ? carvedilol (COREG) 25 MG tablet TAKE 1 TABLET BY MOUTH TWICE A DAY  ? cholecalciferol (VITAMIN D) 1000 UNITS tablet Take 1,000 Units by mouth daily.  ? ibuprofen (ADVIL,MOTRIN) 200 MG tablet Take  200 mg by mouth every 6 (six) hours as needed.  ? Multiple Vitamin (MULTIVITAMIN) tablet Take 1 tablet by mouth daily.  ? omeprazole (PRILOSEC OTC) 20 MG tablet Take 20 mg by mouth as needed.  ? potassium chloride (KLOR-CON) 10 MEQ tablet TAKE 1 TABLET BY MOUTH EVERY DAY  ? valsartan (DIOVAN) 320 MG tablet TAKE 1 TABLET BY MOUTH EVERY DAY  ? [DISCONTINUED] hydrochlorothiazide (MICROZIDE) 12.5 MG capsule Take 12.5 mg by mouth 3 (three) times a week.  ?  ? ?Allergies:   Amlodipine, Codeine sulfate, Maxzide [hydrochlorothiazide w-triamterene], and Trazodone and nefazodone  ? ?Social History  ? ?Socioeconomic History  ? Marital status: Widowed  ?  Spouse name: Not on file  ? Number of children: 1  ? Years of education: Not on file  ? Highest education level: Not on file  ?Occupational History  ? Occupation: Studio//Artist//Teaches  ?  Employer: RETIRED  ?Tobacco Use  ? Smoking status: Never  ? Smokeless tobacco: Never  ?Vaping Use  ? Vaping Use: Never used  ?Substance and Sexual Activity  ? Alcohol use: Yes  ?  Alcohol/week: 14.0 standard drinks  ?  Types: 14 Glasses of wine per week  ?  Comment: red wine several times a week  ? Drug use: No  ? Sexual activity: Yes  ?Other Topics Concern  ? Not on file  ?Social History Narrative  ? "  [name]"  ? Married/remarried  ? Husband with dx creutzfeldt-jakob prion disease 2017 then memory unit of nursing home - passed away Feb 11, 2016  ? One daughter, local  ? Occupation: retired  ? 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 Activity: sometimes uses weights, some walking but limited by bunion  ? Diet: good water, fruits/vegetables daily  ? ?Social Determinants of Health  ? ?Financial Resource Strain: Not on file  ?Food Insecurity: Not on file  ?Transportation Needs: Not on file  ?Physical Activity: Not on file  ?Stress: Not on file  ?Social Connections: Not on file  ?  ? ?Family History: ?The patient's family history includes Aneurysm in her brother; Breast cancer in  her maternal aunt; Cancer in her brother and maternal aunt; Heart disease in her father; Hypertension in her father and mother; Kidney disease in her father. ? ?ROS:   ?Please see the history of present illness.    ? All other systems reviewed and are negative. ? ?EKGs/Labs/Other Studies Reviewed:   ? ?The following studies were reviewed today: ? ? ?EKG:  EKG ordered today.  EKG shows normal sinus rhythm. ? ?Recent Labs: ?No results found for requested labs within last 8760 hours.  ?Recent Lipid Panel ?   ?Component Value Date/Time  ? CHOL 213 (H) 02/22/2020 1234  ? TRIG 144.0 02/22/2020 1234  ? HDL 85.70 02/22/2020 1234  ? CHOLHDL 2 02/22/2020 1234  ? VLDL 28.8 02/22/2020 1234  ? [place] 98 02/22/2020 1234  ? LDLDIRECT 121.3 12/16/2011 0837  ? ? ? ?Risk Assessment/Calculations:   ? ? ? ?Physical Exam:   ? ?VS:  BP (!) 146/70 (BP Location: Left Arm, Patient Position: Sitting, Cuff Size: Normal)   Pulse 62   Ht 5' 5.5" (1.664 m)   Wt 165 lb (74.8 kg)   SpO2 98%   BMI 27.04 kg/m?    ? ?Wt Readings from Last 3 Encounters:  ?04/25/21 165 lb (74.8 kg)  ?11/01/20 163 lb (73.9 kg)  ?09/12/20 162 lb (73.5 kg)  ?  ? ?GEN:  Well nourished, well developed in no acute distress ?HEENT: Normal ?NECK: No JVD; No carotid bruits ?LYMPHATICS: No lymphadenopathy ?CARDIAC: RRR, no murmurs, rubs, gallops ?RESPIRATORY:  Clear to auscultation without rales, wheezing or rhonchi  ?ABDOMEN: Soft, non-tender, non-distended ?MUSCULOSKELETAL:  No edema; No deformity  ?SKIN: Warm and dry ?NEUROLOGIC:  Alert and oriented x 3 ?PSYCHIATRIC:  Normal affect  ? ?ASSESSMENT:   ? ?1. Primary hypertension   ?2. Pure hypercholesterolemia   ?3. Anxiousness   ? ? ? ?PLAN:   ? ?In order of problems listed above: ? ?History of hypertension, BP elevated.  Start HCTZ 12.5 mg daily, continue Coreg, valsartan.  Anxiety/anxiousness could be contributing to elevated BP. ?History of hyperlipidemia, low-cholesterol diet advised.  Due to age, no significant  benefit from a statin. ?States feeling very anxious, may have a component of anxiety.  Follow-up with PCP regarding this. ? ?Follow-up 3 months. ? ? ?Medication Adjustments/Labs and Tests Ordered: ?Current medicines are reviewed at length with the patient today.  Concerns regarding medicines are outlined above.  ?Orders Placed This Encounter  ?Procedures  ? EKG 12-Lead  ? ? ?Meds ordered this encounter  ?Medications  ? hydrochlorothiazide (MICROZIDE) 12.5 MG capsule  ?  Sig: Take 1 capsule (12.5 mg total) by mouth daily.  ?  Dispense:  30 capsule  ?  Refill:  5  ? ? ? ?Patient Instructions  ?Medication Instructions:  ? ?  Your physician has recommended you make the following change in your medication:  ? ? Take Hydrochlorothiazide 12.5 MG once a day. ? ?*If you need a refill on your cardiac medications before your next appointment, please call your pharmacy* ? ? ?Lab Work: ?None ordered ?If you have labs (blood work) drawn today and your tests are completely normal, you will receive your results only by: ?MyChart Message (if you have MyChart) OR ?A paper copy in the mail ?If you have any lab test that is abnormal or we need to change your treatment, we will call you to review the results. ? ? ?Testing/Procedures: ?None ordered ? ? ?Follow-Up: ?At [facility], you and your health needs are our priority.  As part of our continuing mission to provide you with exceptional heart care, we have created designated Provider Care Teams.  These Care Teams include your primary Cardiologist (physician) and Advanced Practice Providers (APPs -  Physician Assistants and Nurse Practitioners) who all work together to provide you with the care you need, when you need it. ? ?We recommend signing up for the patient portal called "MyChart".  Sign up information is provided on this After Visit Summary.  MyChart is used to connect with patients for Virtual Visits (Telemedicine).  Patients are able to view lab/test results, encounter  notes, upcoming appointments, etc.  Non-urgent messages can be sent to your provider as well.   ?To learn more about what you can do with MyChart, go to [email or web address].   ? ?Your next appointment:   ?3 mont

## 2021-04-25 NOTE — Patient Instructions (Signed)
Medication Instructions:  ? ?Your physician has recommended you make the following change in your medication:  ? ? Take Hydrochlorothiazide 12.5 MG once a day. ? ?*If you need a refill on your cardiac medications before your next appointment, please call your pharmacy* ? ? ?Lab Work: ?None ordered ?If you have labs (blood work) drawn today and your tests are completely normal, you will receive your results only by: ?MyChart Message (if you have MyChart) OR ?A paper copy in the mail ?If you have any lab test that is abnormal or we need to change your treatment, we will call you to review the results. ? ? ?Testing/Procedures: ?None ordered ? ? ?Follow-Up: ?At [facility], you and your health needs are our priority.  As part of our continuing mission to provide you with exceptional heart care, we have created designated Provider Care Teams.  These Care Teams include your primary Cardiologist (physician) and Advanced Practice Providers (APPs -  Physician Assistants and Nurse Practitioners) who all work together to provide you with the care you need, when you need it. ? ?We recommend signing up for the patient portal called "MyChart".  Sign up information is provided on this After Visit Summary.  MyChart is used to connect with patients for Virtual Visits (Telemedicine).  Patients are able to view lab/test results, encounter notes, upcoming appointments, etc.  Non-urgent messages can be sent to your provider as well.   ?To learn more about what you can do with MyChart, go to [email or web address].   ? ?Your next appointment:   ?3 month(s) ? ?The format for your next appointment:   ?In Person ? ?Provider:   ?You may see Kate Sable, MD or one of the following Advanced Practice Providers on your designated Care Team:   ?Murray Hodgkins, NP ?Christell Faith, PA-C ?Cadence Kathlen Mody, PA-C  ? ? ?Other Instructions ? ? ?

## 2021-05-01 DIAGNOSIS — L570 Actinic keratosis: Secondary | ICD-10-CM | POA: Diagnosis not present

## 2021-05-14 ENCOUNTER — Other Ambulatory Visit: Payer: Self-pay | Admitting: Family Medicine

## 2021-05-14 DIAGNOSIS — I10 Essential (primary) hypertension: Secondary | ICD-10-CM

## 2021-05-15 ENCOUNTER — Other Ambulatory Visit: Payer: Self-pay | Admitting: Family Medicine

## 2021-05-15 NOTE — Telephone Encounter (Signed)
Refill request Valsartan ?Last office visit with cardiology 04/25/21 ?Last office visit acute 09/12/20 ?No upcoming appointment scheduled ?

## 2021-06-12 ENCOUNTER — Other Ambulatory Visit: Payer: Self-pay | Admitting: Family Medicine

## 2021-06-13 ENCOUNTER — Other Ambulatory Visit: Payer: Self-pay | Admitting: Family Medicine

## 2021-06-13 NOTE — Telephone Encounter (Signed)
Spoke with pt informing her cardiology increased the strength to 5 mg daily at bedtime.  Suggested contact pharmacy and provide rx # for newer rx.  Pt verbalizes understanding and will do so.  ?

## 2021-06-13 NOTE — Telephone Encounter (Signed)
Spoke with [facility] about rx error.  States request was supposed to go to pt's cardiologist.  We can disregard message and they sent request to cards.  ?

## 2021-06-14 ENCOUNTER — Other Ambulatory Visit: Payer: Self-pay | Admitting: *Deleted

## 2021-07-18 DIAGNOSIS — H40013 Open angle with borderline findings, low risk, bilateral: Secondary | ICD-10-CM | POA: Diagnosis not present

## 2021-07-18 DIAGNOSIS — H524 Presbyopia: Secondary | ICD-10-CM | POA: Diagnosis not present

## 2021-07-18 DIAGNOSIS — H35341 Macular cyst, hole, or pseudohole, right eye: Secondary | ICD-10-CM | POA: Diagnosis not present

## 2021-07-18 DIAGNOSIS — H35373 Puckering of macula, bilateral: Secondary | ICD-10-CM | POA: Diagnosis not present

## 2021-07-18 DIAGNOSIS — H353132 Nonexudative age-related macular degeneration, bilateral, intermediate dry stage: Secondary | ICD-10-CM | POA: Diagnosis not present

## 2021-07-26 ENCOUNTER — Ambulatory Visit (INDEPENDENT_AMBULATORY_CARE_PROVIDER_SITE_OTHER): Payer: Medicare Other | Admitting: Cardiology

## 2021-07-26 ENCOUNTER — Encounter: Payer: Self-pay | Admitting: Cardiology

## 2021-07-26 VITALS — BP 130/66 | HR 65 | Ht 65.0 in | Wt 162.0 lb

## 2021-07-26 DIAGNOSIS — E7800 Pure hypercholesterolemia, unspecified: Secondary | ICD-10-CM

## 2021-07-26 DIAGNOSIS — I10 Essential (primary) hypertension: Secondary | ICD-10-CM

## 2021-08-17 ENCOUNTER — Other Ambulatory Visit: Payer: Self-pay | Admitting: Family Medicine

## 2021-08-17 DIAGNOSIS — I10 Essential (primary) hypertension: Secondary | ICD-10-CM

## 2021-08-20 ENCOUNTER — Other Ambulatory Visit: Payer: Self-pay | Admitting: *Deleted

## 2021-08-29 ENCOUNTER — Other Ambulatory Visit: Payer: Self-pay | Admitting: Family Medicine

## 2021-08-30 ENCOUNTER — Other Ambulatory Visit: Payer: Self-pay

## 2021-08-30 NOTE — Telephone Encounter (Signed)
Please advise if ok to refill last filled by PCP.

## 2021-09-07 ENCOUNTER — Other Ambulatory Visit: Payer: Self-pay | Admitting: Family Medicine

## 2021-09-07 DIAGNOSIS — N289 Disorder of kidney and ureter, unspecified: Secondary | ICD-10-CM

## 2021-09-07 DIAGNOSIS — R7303 Prediabetes: Secondary | ICD-10-CM

## 2021-09-07 DIAGNOSIS — E7800 Pure hypercholesterolemia, unspecified: Secondary | ICD-10-CM

## 2021-09-10 ENCOUNTER — Other Ambulatory Visit (INDEPENDENT_AMBULATORY_CARE_PROVIDER_SITE_OTHER): Payer: Medicare Other

## 2021-09-10 DIAGNOSIS — E7800 Pure hypercholesterolemia, unspecified: Secondary | ICD-10-CM

## 2021-09-10 DIAGNOSIS — N289 Disorder of kidney and ureter, unspecified: Secondary | ICD-10-CM | POA: Diagnosis not present

## 2021-09-10 DIAGNOSIS — R7303 Prediabetes: Secondary | ICD-10-CM

## 2021-09-10 LAB — LIPID PANEL
Cholesterol: 204 mg/dL — ABNORMAL HIGH (ref 0–200)
HDL: 67.2 mg/dL (ref >39.00)
LDL Cholesterol: 118 mg/dL — ABNORMAL HIGH (ref 0–99)
NonHDL: 136.4
Total CHOL/HDL Ratio: 3
Triglycerides: 93 mg/dL (ref 0.0–149.0)
VLDL: 18.6 mg/dL (ref 0.0–40.0)

## 2021-09-10 LAB — CBC WITH DIFFERENTIAL/PLATELET
Basophils Absolute: 0 10*3/uL (ref 0.0–0.1)
Basophils Relative: 0.9 % (ref 0.0–3.0)
Eosinophils Absolute: 0.1 10*3/uL (ref 0.0–0.7)
Eosinophils Relative: 1.9 % (ref 0.0–5.0)
HCT: 38.5 % (ref 36.0–46.0)
Hemoglobin: 12.9 g/dL (ref 12.0–15.0)
Lymphocytes Relative: 20.7 % (ref 12.0–46.0)
Lymphs Abs: 1 10*3/uL (ref 0.7–4.0)
MCHC: 33.6 g/dL (ref 30.0–36.0)
MCV: 93.5 fl (ref 78.0–100.0)
Monocytes Absolute: 0.4 10*3/uL (ref 0.1–1.0)
Monocytes Relative: 8.5 % (ref 3.0–12.0)
Neutro Abs: 3.4 10*3/uL (ref 1.4–7.7)
Neutrophils Relative %: 68 % (ref 43.0–77.0)
Platelets: 210 10*3/uL (ref 150.0–400.0)
RBC: 4.12 Mil/uL (ref 3.87–5.11)
RDW: 13.8 % (ref 11.5–15.5)
WBC: 5 10*3/uL (ref 4.0–10.5)

## 2021-09-10 LAB — COMPREHENSIVE METABOLIC PANEL
ALT: 12 U/L (ref 0–35)
AST: 11 U/L (ref 0–37)
Albumin: 3.9 g/dL (ref 3.5–5.2)
Alkaline Phosphatase: 74 U/L (ref 39–117)
BUN: 14 mg/dL (ref 6–23)
CO2: 30 mEq/L (ref 19–32)
Calcium: 9.2 mg/dL (ref 8.4–10.5)
Chloride: 102 mEq/L (ref 96–112)
Creatinine, Ser: 0.81 mg/dL (ref 0.40–1.20)
GFR: 64.67 mL/min (ref >60.00)
Glucose, Bld: 114 mg/dL — ABNORMAL HIGH (ref 70–99)
Potassium: 4.1 mEq/L (ref 3.5–5.1)
Sodium: 140 mEq/L (ref 135–145)
Total Bilirubin: 0.5 mg/dL (ref 0.2–1.2)
Total Protein: 6.1 g/dL (ref 6.0–8.3)

## 2021-09-10 LAB — HEMOGLOBIN A1C: Hgb A1c MFr Bld: 5.6 % (ref 4.6–6.5)

## 2021-09-10 LAB — TSH: TSH: 3.2 u[IU]/mL (ref 0.35–5.50)

## 2021-09-12 ENCOUNTER — Other Ambulatory Visit: Payer: Self-pay

## 2021-09-12 MED ORDER — AMLODIPINE BESYLATE 5 MG PO TABS: ORAL_TABLET | ORAL | 0 refills | Status: DC

## 2021-09-12 NOTE — Telephone Encounter (Signed)
Patient called back, she is taking '5mg'$ .

## 2021-09-12 NOTE — Telephone Encounter (Signed)
Lvm asking pt to call back.  Need to clarify, is pt taking 5 mg tablet or half a tablet (2.5 mg) daily?

## 2021-09-12 NOTE — Telephone Encounter (Signed)
Noted.  E-scribed refill to [facility].

## 2021-09-12 NOTE — Telephone Encounter (Signed)
MEDICATION: amLODipine (NORVASC) 5 MG tablet  PHARMACY: [id number], [place] - [address]  Comments:   **Let patient know to contact pharmacy at the end of the day to make sure medication is ready. **  ** Please notify patient to allow 48-72 hours to process**  **Encourage patient to contact the pharmacy for refills or they can request refills through [facility]*

## 2021-09-17 ENCOUNTER — Ambulatory Visit (INDEPENDENT_AMBULATORY_CARE_PROVIDER_SITE_OTHER): Payer: Medicare Other | Admitting: Family Medicine

## 2021-09-17 ENCOUNTER — Encounter: Payer: Self-pay | Admitting: Family Medicine

## 2021-09-17 VITALS — BP 136/64 | HR 64 | Temp 97.3°F | Ht 65.0 in | Wt 161.1 lb

## 2021-09-17 DIAGNOSIS — H40003 Preglaucoma, unspecified, bilateral: Secondary | ICD-10-CM | POA: Diagnosis not present

## 2021-09-17 DIAGNOSIS — H9191 Unspecified hearing loss, right ear: Secondary | ICD-10-CM

## 2021-09-17 DIAGNOSIS — I10 Essential (primary) hypertension: Secondary | ICD-10-CM

## 2021-09-17 DIAGNOSIS — H35033 Hypertensive retinopathy, bilateral: Secondary | ICD-10-CM | POA: Diagnosis not present

## 2021-09-17 DIAGNOSIS — R7303 Prediabetes: Secondary | ICD-10-CM | POA: Diagnosis not present

## 2021-09-17 DIAGNOSIS — E7800 Pure hypercholesterolemia, unspecified: Secondary | ICD-10-CM

## 2021-09-17 DIAGNOSIS — G4700 Insomnia, unspecified: Secondary | ICD-10-CM | POA: Diagnosis not present

## 2021-09-17 DIAGNOSIS — K219 Gastro-esophageal reflux disease without esophagitis: Secondary | ICD-10-CM | POA: Diagnosis not present

## 2021-09-17 DIAGNOSIS — Z0000 Encounter for general adult medical examination without abnormal findings: Secondary | ICD-10-CM | POA: Diagnosis not present

## 2021-09-17 MED ORDER — VALSARTAN 320 MG PO TABS: 320.0000 mg | ORAL_TABLET | Freq: Every day | ORAL | 3 refills | Status: DC

## 2021-09-17 MED ORDER — CARVEDILOL 25 MG PO TABS: 25.0000 mg | ORAL_TABLET | Freq: Two times a day (BID) | ORAL | 3 refills | Status: DC

## 2021-09-17 MED ORDER — HYDROCHLOROTHIAZIDE 12.5 MG PO CAPS: 12.5000 mg | ORAL_CAPSULE | Freq: Every day | ORAL | 3 refills | Status: DC

## 2021-09-17 MED ORDER — AMLODIPINE BESYLATE 5 MG PO TABS: ORAL_TABLET | ORAL | 3 refills | Status: DC

## 2021-09-17 NOTE — Assessment & Plan Note (Signed)
Chronic, good control on current regimen. Appreciate cardiology care.

## 2021-09-17 NOTE — Assessment & Plan Note (Signed)

## 2021-09-17 NOTE — Assessment & Plan Note (Addendum)
Continues hearing aide use. Planning to return to [employer] audiologist for hearing aides.

## 2021-09-17 NOTE — Assessment & Plan Note (Signed)
Chronic, stable period on PRN omeprazole '20mg'$ .

## 2021-09-17 NOTE — Assessment & Plan Note (Signed)
Sees [name]

## 2021-09-17 NOTE — Progress Notes (Signed)
Patient ID: Kristine Owens, female    DOB: 1930/03/01, 88 y.o.   MRN: [id number]  This visit was conducted in person.  BP 136/64   Pulse 64   Temp (!) 97.3 F (36.3 C) (Temporal)   Ht '5\' 5"'$  (1.651 m)   Wt 161 lb 2 oz (73.1 kg)   SpO2 96%   BMI 26.81 kg/m    CC: AMW Subjective:   HPI: Kristine Owens is a 88 y.o. female presenting on 09/17/2021 for Medicare Wellness   Did not see health advisor this year.   Hearing Screening   '500Hz'$  '1000Hz'$  '2000Hz'$  '4000Hz'$   Right ear 40 0 0 0  Left ear      Comments: Wears bilateral hearing aids.  Only wearing L at today's OV.   Vision Screening - Comments:: Last eye exam, Jan- Mar 2023.  [place] Office Visit from 09/17/2021 in [place] [facility]  PHQ-2 Total Score 0          09/17/2021    2:15 PM 02/22/2020   11:46 AM 02/09/2019    9:03 AM 02/01/2018    8:19 AM 01/29/2017    9:46 AM  Fall Risk   Falls in the past year? 0 0 0 0 No  Number falls in past yr:   0    Injury with Fall?   0    Risk for fall due to :   Medication side effect    Follow up   Falls evaluation completed;Falls prevention discussed     Sees cardiology (Dr Garen Lah) for difficult to control HTN management - currently on amlodipine '5mg'$  daily, carvedilol '25mg'$  bid, hctz 12.'5mg'$  daily, valsartan '320mg'$  daily.   Mows her lawn - several aces, ride mower. 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   COVID infection 09/2020 - treated with molnupiravir.   Preventative: Colon cancer screening - 2010 Ardis Hughs) normal, some ext hemorrhoids. Given h/o adenomatous polyps, rec rpt in 5 yrs. iFOB neg yearly, last 02/2018. Aged out.  [facility] - [facility]. Does breast exams at home. Discussed mammo Q2 yrs.  Well woman exam - s/p total hysterectomy, ovaries removed as well. Aged out.  DEXA - normal 2010.  Flu shot - yearly.  [place] 02/2019, 03/2019, booster 11/2019 (x2) Pneumovax - 2003. Prevnar 2015.  Td - 2013   zostavax - 2011.  shingrix - discussed  Advanced directives: scanned 02/2019. Wants daughter [name] to be HCPOA. [place] for temporary life support/ CPR/ intubation but does not want prolonged life support if terminal condition. [place] with artifical hydration.  Seat belt use discussed  Sunscreen use discussed. No changing moles. Sees derm yearly Allyson Sabal).  Sleep - averaging 8 hours/night  Non smoker  Alcohol - 2 glasses wine nightly  Dentist q6 mo  Eye exam yearly - cataracts removed.  Bowel - no constipation  Bladder - no incontinence  "[name]" Married/remarried Husband with dx creutzfeldt-jakob prion disease 2017 then memory unit of nursing home - passed away 04-Feb-2016 Lives alone. One daughter [name], local Occupation: retired, previously enjoyed teaching 2 art classes/wk  Activity: sometimes uses weights, some walking but limited by bunion Diet: good water, fruits/vegetables daily     Relevant past medical, surgical, family and social history reviewed and updated as indicated. Interim medical history since our last visit reviewed. Allergies and medications reviewed and updated. Outpatient Medications Prior to Visit  Medication Sig Dispense Refill   aspirin EC 81 MG tablet Take 1  tablet (81 mg total) by mouth daily.     b complex vitamins tablet Take 1 tablet by mouth daily.     cholecalciferol (VITAMIN D) 1000 UNITS tablet Take 1,000 Units by mouth daily.     ibuprofen (ADVIL,MOTRIN) 200 MG tablet Take 200 mg by mouth every 6 (six) hours as needed.     Multiple Vitamin (MULTIVITAMIN) tablet Take 1 tablet by mouth daily.     omeprazole (PRILOSEC OTC) 20 MG tablet Take 20 mg by mouth as needed.     amLODipine (NORVASC) 5 MG tablet TAKE 1 TABLET BY MOUTH EVERYDAY AT BEDTIME 90 tablet 0   carvedilol (COREG) 25 MG tablet TAKE 1 TABLET BY MOUTH TWICE A DAY 180 tablet 3   hydrochlorothiazide (MICROZIDE) 12.5 MG capsule Take 1 capsule (12.5 mg total) by mouth daily. 30 capsule 5   valsartan  (DIOVAN) 320 MG tablet TAKE 1 TABLET BY MOUTH EVERY DAY 90 tablet 0   potassium chloride (KLOR-CON) 10 MEQ tablet TAKE 1 TABLET BY MOUTH EVERY DAY 90 tablet 1   No facility-administered medications prior to visit.     Per HPI unless specifically indicated in ROS section below Review of Systems  Objective:  BP 136/64   Pulse 64   Temp (!) 97.3 F (36.3 C) (Temporal)   Ht '5\' 5"'$  (1.651 m)   Wt 161 lb 2 oz (73.1 kg)   SpO2 96%   BMI 26.81 kg/m   Wt Readings from Last 3 Encounters:  09/17/21 161 lb 2 oz (73.1 kg)  07/26/21 162 lb (73.5 kg)  04/25/21 165 lb (74.8 kg)      Physical Exam Vitals and nursing note reviewed.  Constitutional:      Appearance: Normal appearance. She is not ill-appearing.  HENT:     Head: Normocephalic and atraumatic.     Right Ear: Tympanic membrane, ear canal and external ear normal. There is no impacted cerumen.     Left Ear: Tympanic membrane, ear canal and external ear normal. There is no impacted cerumen.  Eyes:     General:        Right eye: No discharge.        Left eye: No discharge.     Extraocular Movements: Extraocular movements intact.     Conjunctiva/sclera: Conjunctivae normal.     Pupils: Pupils are equal, round, and reactive to light.  Neck:     Thyroid: No thyroid mass or thyromegaly.  Cardiovascular:     Rate and Rhythm: Normal rate and regular rhythm.     Pulses: Normal pulses.     Heart sounds: No murmur heard. Pulmonary:     Effort: Pulmonary effort is normal. No respiratory distress.     Breath sounds: Normal breath sounds. No wheezing, rhonchi or rales.  Abdominal:     General: Bowel sounds are normal. There is no distension.     Palpations: Abdomen is soft. There is no mass.     Tenderness: There is no abdominal tenderness. There is no guarding or rebound.     Hernia: No hernia is present.  Musculoskeletal:     Cervical back: Normal range of motion and neck supple. No rigidity.     Right lower leg: No edema.     Left  lower leg: No edema.  Lymphadenopathy:     Cervical: No cervical adenopathy.  Skin:    General: Skin is warm and dry.     Findings: No rash.  Neurological:     General:  No focal deficit present.     Mental Status: She is alert. Mental status is at baseline.     Comments:  Recall 2/3, 3/3 with cue Calculation DLROW 5/5  Psychiatric:        Mood and Affect: Mood normal.        Behavior: Behavior normal.       Results for orders placed or performed in visit on 09/10/21  TSH  Result Value Ref Range   TSH 3.20 0.35 - 5.50 uIU/mL  Hemoglobin A1c  Result Value Ref Range   Hgb A1c MFr Bld 5.6 4.6 - 6.5 %  CBC with Differential/Platelet  Result Value Ref Range   WBC 5.0 4.0 - 10.5 K/uL   RBC 4.12 3.87 - 5.11 Mil/uL   Hemoglobin 12.9 12.0 - 15.0 g/dL   HCT 38.5 36.0 - 46.0 %   MCV 93.5 78.0 - 100.0 fl   MCHC 33.6 30.0 - 36.0 g/dL   RDW 13.8 11.5 - 15.5 %   Platelets 210.0 150.0 - 400.0 K/uL   Neutrophils Relative % 68.0 43.0 - 77.0 %   Lymphocytes Relative 20.7 12.0 - 46.0 %   Monocytes Relative 8.5 3.0 - 12.0 %   Eosinophils Relative 1.9 0.0 - 5.0 %   Basophils Relative 0.9 0.0 - 3.0 %   Neutro Abs 3.4 1.4 - 7.7 K/uL   Lymphs Abs 1.0 0.7 - 4.0 K/uL   Monocytes Absolute 0.4 0.1 - 1.0 K/uL   Eosinophils Absolute 0.1 0.0 - 0.7 K/uL   Basophils Absolute 0.0 0.0 - 0.1 K/uL  Comprehensive metabolic panel  Result Value Ref Range   Sodium 140 135 - 145 mEq/L   Potassium 4.1 3.5 - 5.1 mEq/L   Chloride 102 96 - 112 mEq/L   CO2 30 19 - 32 mEq/L   Glucose, Bld 114 (H) 70 - 99 mg/dL   BUN 14 6 - 23 mg/dL   Creatinine, Ser 0.81 0.40 - 1.20 mg/dL   Total Bilirubin 0.5 0.2 - 1.2 mg/dL   Alkaline Phosphatase 74 39 - 117 U/L   AST 11 0 - 37 U/L   ALT 12 0 - 35 U/L   Total Protein 6.1 6.0 - 8.3 g/dL   Albumin 3.9 3.5 - 5.2 g/dL   GFR 64.67 >60.00 mL/min   Calcium 9.2 8.4 - 10.5 mg/dL  Lipid panel  Result Value Ref Range   Cholesterol 204 (H) 0 - 200 mg/dL   Triglycerides 93.0 0.0  - 149.0 mg/dL   HDL 67.20 >39.00 mg/dL   VLDL 18.6 0.0 - 40.0 mg/dL   LDL Cholesterol 118 (H) 0 - 99 mg/dL   Total CHOL/HDL Ratio 3    NonHDL 136.40     Assessment & Plan:   Problem List Items Addressed This Visit     Medicare annual wellness visit, subsequent - Primary (Chronic)    I have personally reviewed the Medicare Annual Wellness questionnaire and have noted 1. The patient's medical and social history 2. Their use of alcohol, tobacco or illicit drugs 3. Their current medications and supplements 4. The patient's functional ability including ADL's, fall risks, home safety risks and hearing or visual impairment. Cognitive function has been assessed and addressed as indicated.  5. Diet and physical activity 6. Evidence for depression or mood disorders The patients weight, height, BMI have been recorded in the chart. I have made referrals, counseling and provided education to the patient based on review of the above and I have provided the  pt with a written personalized care plan for preventive services. Provider list updated.. See scanned questionairre as needed for further documentation. Reviewed preventative protocols and updated unless pt declined.       Prediabetes    Recent A1c normal.       HLD (hyperlipidemia)    Chronic, stable period off statin. Reviewed diet choices to improve LDL levels.  The ASCVD Risk score (Arnett DK, et al., 2019) failed to calculate for the following reasons:   The 2019 ASCVD risk score is only valid for ages 28 to 2       Relevant Medications   hydrochlorothiazide (MICROZIDE) 12.5 MG capsule   amLODipine (NORVASC) 5 MG tablet   valsartan (DIOVAN) 320 MG tablet   carvedilol (COREG) 25 MG tablet   Essential hypertension    Chronic, good control on current regimen. Appreciate cardiology care.       Relevant Medications   hydrochlorothiazide (MICROZIDE) 12.5 MG capsule   amLODipine (NORVASC) 5 MG tablet   valsartan (DIOVAN) 320 MG  tablet   carvedilol (COREG) 25 MG tablet   GERD    Chronic, stable period on PRN omeprazole '20mg'$ .       Insomnia    Discussed predominantly sleep maintenance insomnia and possible relation of this to alcohol intake.       Hearing loss    Continues hearing aide use. Planning to return to [facility] audiologist for hearing aides.       Open angle with borderline glaucoma findings, bilateral    Sees [name]      Hypertensive retinopathy of both eyes    Followed by retinologist Dr Zigmund Daniel.        Meds ordered this encounter  Medications   hydrochlorothiazide (MICROZIDE) 12.5 MG capsule    Sig: Take 1 capsule (12.5 mg total) by mouth daily.    Dispense:  90 capsule    Refill:  3   amLODipine (NORVASC) 5 MG tablet    Sig: TAKE 1 TABLET BY MOUTH EVERYDAY AT BEDTIME    Dispense:  90 tablet    Refill:  3   valsartan (DIOVAN) 320 MG tablet    Sig: Take 1 tablet (320 mg total) by mouth daily.    Dispense:  90 tablet    Refill:  3   carvedilol (COREG) 25 MG tablet    Sig: Take 1 tablet (25 mg total) by mouth 2 (two) times daily.    Dispense:  180 tablet    Refill:  3   No orders of the defined types were placed in this encounter.    Patient instructions: You are doing wonderful today. Good to see you!  Send [place] dates of second COVID booster.  If interested, check with pharmacy about new 2 shot shingles series (shingrix).  Return as needed or in 1 year for next wellness visit.   Follow up plan: Return in about 1 year (around 09/18/2022) for medicare wellness visit.  Ria Bush, MD

## 2021-09-17 NOTE — Assessment & Plan Note (Signed)
Followed by retinologist Dr Zigmund Daniel.

## 2021-09-17 NOTE — Patient Instructions (Addendum)
You are doing wonderful today. Good to see you!  Send [place] dates of second COVID booster.  If interested, check with pharmacy about new 2 shot shingles series (shingrix).  Return as needed or in 1 year for next wellness visit.   Health Maintenance After Age 86 After age 71, you are at a higher risk for certain long-term diseases and infections as well as injuries from falls. Falls are a major cause of broken bones and head injuries in people who are older than age 50. Getting regular preventive care can help to keep you healthy and well. Preventive care includes getting regular testing and making lifestyle changes as recommended by your health care provider. Talk with your health care provider about: Which screenings and tests you should have. A screening is a test that checks for a disease when you have no symptoms. A diet and exercise plan that is right for you. What should I know about screenings and tests to prevent falls? Screening and testing are the best ways to find a health problem early. Early diagnosis and treatment give you the best chance of managing medical conditions that are common after age 52. Certain conditions and lifestyle choices may make you more likely to have a fall. Your health care provider may recommend: Regular vision checks. Poor vision and conditions such as cataracts can make you more likely to have a fall. If you wear glasses, make sure to get your prescription updated if your vision changes. Medicine review. Work with your health care provider to regularly review all of the medicines you are taking, including over-the-counter medicines. Ask your health care provider about any side effects that may make you more likely to have a fall. Tell your health care provider if any medicines that you take make you feel dizzy or sleepy. Strength and balance checks. Your health care provider may recommend certain tests to check your strength and balance while standing, walking, or  changing positions. Foot health exam. Foot pain and numbness, as well as not wearing proper footwear, can make you more likely to have a fall. Screenings, including: Osteoporosis screening. Osteoporosis is a condition that causes the bones to get weaker and break more easily. Blood pressure screening. Blood pressure changes and medicines to control blood pressure can make you feel dizzy. Depression screening. You may be more likely to have a fall if you have a fear of falling, feel depressed, or feel unable to do activities that you used to do. Alcohol use screening. Using too much alcohol can affect your balance and may make you more likely to have a fall. Follow these instructions at home: Lifestyle Do not drink alcohol if: Your health care provider tells you not to drink. If you drink alcohol: Limit how much you have to: 0-1 drink a day for women. 0-2 drinks a day for men. Know how much alcohol is in your drink. In the [place] one drink equals one 12 oz bottle of beer (355 mL), one 5 oz glass of wine (148 mL), or one 1 oz glass of hard liquor (44 mL). Do not use any products that contain nicotine or tobacco. These products include cigarettes, chewing tobacco, and vaping devices, such as e-cigarettes. If you need help quitting, ask your health care provider. Activity  Follow a regular exercise program to stay fit. This will help you maintain your balance. Ask your health care provider what types of exercise are appropriate for you. If you need a cane or walker, use it  as recommended by your health care provider. Wear supportive shoes that have nonskid soles. Safety  Remove any tripping hazards, such as rugs, cords, and clutter. Install safety equipment such as grab bars in bathrooms and safety rails on stairs. Keep rooms and walkways well-lit. General instructions Talk with your health care provider about your risks for falling. Tell your health care provider if: You fall. Be sure to  tell your health care provider about all falls, even ones that seem minor. You feel dizzy, tiredness (fatigue), or off-balance. Take over-the-counter and prescription medicines only as told by your health care provider. These include supplements. Eat a healthy diet and maintain a healthy weight. A healthy diet includes low-fat dairy products, low-fat (lean) meats, and fiber from whole grains, beans, and lots of fruits and vegetables. Stay current with your vaccines. Schedule regular health, dental, and eye exams. Summary Having a healthy lifestyle and getting preventive care can help to protect your health and wellness after age 20. Screening and testing are the best way to find a health problem early and help you avoid having a fall. Early diagnosis and treatment give you the best chance for managing medical conditions that are more common for people who are older than age 76. Falls are a major cause of broken bones and head injuries in people who are older than age 43. Take precautions to prevent a fall at home. Work with your health care provider to learn what changes you can make to improve your health and wellness and to prevent falls. This information is not intended to replace advice given to you by your health care provider. Make sure you discuss any questions you have with your health care provider. Document Revised: 06/11/2020 Document Reviewed: 06/11/2020 Elsevier Patient Education  [place].

## 2021-09-17 NOTE — Assessment & Plan Note (Signed)
Chronic, stable period off statin. Reviewed diet choices to improve LDL levels.  The ASCVD Risk score (Arnett DK, et al., 2019) failed to calculate for the following reasons:   The 2019 ASCVD risk score is only valid for ages 81 to 20

## 2021-09-17 NOTE — Assessment & Plan Note (Signed)
Recent A1c normal.

## 2021-09-17 NOTE — Assessment & Plan Note (Signed)
Discussed predominantly sleep maintenance insomnia and possible relation of this to alcohol intake.

## 2021-11-07 DIAGNOSIS — H40013 Open angle with borderline findings, low risk, bilateral: Secondary | ICD-10-CM | POA: Diagnosis not present

## 2021-11-15 ENCOUNTER — Other Ambulatory Visit: Payer: Self-pay | Admitting: Family Medicine

## 2021-11-15 DIAGNOSIS — I10 Essential (primary) hypertension: Secondary | ICD-10-CM

## 2021-11-15 NOTE — Telephone Encounter (Signed)
Rx sent on 09/17/21, #90/3 to mail order pharmacy.

## 2021-11-22 ENCOUNTER — Other Ambulatory Visit: Payer: Self-pay | Admitting: Family Medicine

## 2021-11-29 ENCOUNTER — Other Ambulatory Visit: Payer: Self-pay | Admitting: Family Medicine

## 2021-11-29 DIAGNOSIS — I10 Essential (primary) hypertension: Secondary | ICD-10-CM

## 2021-11-29 NOTE — Telephone Encounter (Signed)
2nd refill request for valsartan from [facility].  Per pt's chart, rx sent on 09/17/21, #90/3 to [facility] mail order pharmacy.  Spoke with pt notifying her of above info.  I asked if she still uses [facility] mail order pharmacy.   Pt confirms she does and just received a shipment from them.  Informed her I will deny [facility] request.  Request denied.

## 2021-12-09 ENCOUNTER — Other Ambulatory Visit: Payer: Self-pay | Admitting: Family Medicine

## 2021-12-10 NOTE — Telephone Encounter (Signed)
Too soon. Rx sent on 09/17/21, #90/3 to [facility] mail order pharmacy.

## 2021-12-16 ENCOUNTER — Other Ambulatory Visit: Payer: Self-pay | Admitting: Family Medicine

## 2021-12-16 DIAGNOSIS — I10 Essential (primary) hypertension: Secondary | ICD-10-CM

## 2022-02-05 ENCOUNTER — Other Ambulatory Visit: Payer: Self-pay | Admitting: Family Medicine

## 2022-02-05 DIAGNOSIS — Z1231 Encounter for screening mammogram for malignant neoplasm of breast: Secondary | ICD-10-CM

## 2022-03-27 ENCOUNTER — Ambulatory Visit
Admission: RE | Admit: 2022-03-27 | Discharge: 2022-03-27 | Disposition: A | Discharge status: Home / Self Care | Payer: Medicare Other | Source: Ambulatory Visit | Attending: Family Medicine | Admitting: Family Medicine

## 2022-03-27 DIAGNOSIS — Z1231 Encounter for screening mammogram for malignant neoplasm of breast: Secondary | ICD-10-CM

## 2022-04-16 DIAGNOSIS — L089 Local infection of the skin and subcutaneous tissue, unspecified: Secondary | ICD-10-CM | POA: Diagnosis not present

## 2022-04-17 DIAGNOSIS — L570 Actinic keratosis: Secondary | ICD-10-CM | POA: Diagnosis not present

## 2022-04-17 DIAGNOSIS — L448 Other specified papulosquamous disorders: Secondary | ICD-10-CM | POA: Diagnosis not present

## 2022-04-17 DIAGNOSIS — L814 Other melanin hyperpigmentation: Secondary | ICD-10-CM | POA: Diagnosis not present

## 2022-04-17 DIAGNOSIS — D225 Melanocytic nevi of trunk: Secondary | ICD-10-CM | POA: Diagnosis not present

## 2022-04-17 DIAGNOSIS — L821 Other seborrheic keratosis: Secondary | ICD-10-CM | POA: Diagnosis not present

## 2022-04-24 ENCOUNTER — Encounter (INDEPENDENT_AMBULATORY_CARE_PROVIDER_SITE_OTHER): Payer: Medicare Other | Admitting: Ophthalmology

## 2022-05-06 ENCOUNTER — Encounter (INDEPENDENT_AMBULATORY_CARE_PROVIDER_SITE_OTHER): Payer: Medicare Other | Admitting: Ophthalmology

## 2022-05-06 DIAGNOSIS — H33301 Unspecified retinal break, right eye: Secondary | ICD-10-CM | POA: Diagnosis not present

## 2022-05-06 DIAGNOSIS — H43813 Vitreous degeneration, bilateral: Secondary | ICD-10-CM | POA: Diagnosis not present

## 2022-05-06 DIAGNOSIS — H35033 Hypertensive retinopathy, bilateral: Secondary | ICD-10-CM | POA: Diagnosis not present

## 2022-05-06 DIAGNOSIS — I10 Essential (primary) hypertension: Secondary | ICD-10-CM

## 2022-05-06 DIAGNOSIS — D3132 Benign neoplasm of left choroid: Secondary | ICD-10-CM | POA: Diagnosis not present

## 2022-05-06 DIAGNOSIS — H353132 Nonexudative age-related macular degeneration, bilateral, intermediate dry stage: Secondary | ICD-10-CM | POA: Diagnosis not present

## 2022-06-04 DIAGNOSIS — H353132 Nonexudative age-related macular degeneration, bilateral, intermediate dry stage: Secondary | ICD-10-CM | POA: Diagnosis not present

## 2022-06-04 DIAGNOSIS — H35373 Puckering of macula, bilateral: Secondary | ICD-10-CM | POA: Diagnosis not present

## 2022-06-04 DIAGNOSIS — H3554 Dystrophies primarily involving the retinal pigment epithelium: Secondary | ICD-10-CM | POA: Diagnosis not present

## 2022-06-04 DIAGNOSIS — H35341 Macular cyst, hole, or pseudohole, right eye: Secondary | ICD-10-CM | POA: Diagnosis not present

## 2022-06-04 DIAGNOSIS — H40013 Open angle with borderline findings, low risk, bilateral: Secondary | ICD-10-CM | POA: Diagnosis not present

## 2022-06-10 ENCOUNTER — Encounter: Payer: Self-pay | Admitting: Family Medicine

## 2022-06-10 DIAGNOSIS — H40013 Open angle with borderline findings, low risk, bilateral: Secondary | ICD-10-CM | POA: Insufficient documentation

## 2022-09-02 DIAGNOSIS — M47819 Spondylosis without myelopathy or radiculopathy, site unspecified: Secondary | ICD-10-CM | POA: Diagnosis not present

## 2022-09-02 DIAGNOSIS — M160 Bilateral primary osteoarthritis of hip: Secondary | ICD-10-CM | POA: Diagnosis not present

## 2022-09-03 ENCOUNTER — Encounter (INDEPENDENT_AMBULATORY_CARE_PROVIDER_SITE_OTHER): Payer: Self-pay

## 2022-09-13 ENCOUNTER — Other Ambulatory Visit: Payer: Self-pay | Admitting: Family Medicine

## 2022-09-13 ENCOUNTER — Encounter: Payer: Self-pay | Admitting: Family Medicine

## 2022-09-13 DIAGNOSIS — R7303 Prediabetes: Secondary | ICD-10-CM

## 2022-09-13 DIAGNOSIS — E7800 Pure hypercholesterolemia, unspecified: Secondary | ICD-10-CM

## 2022-09-13 DIAGNOSIS — E559 Vitamin D deficiency, unspecified: Secondary | ICD-10-CM

## 2022-09-13 DIAGNOSIS — N289 Disorder of kidney and ureter, unspecified: Secondary | ICD-10-CM

## 2022-09-16 ENCOUNTER — Other Ambulatory Visit (INDEPENDENT_AMBULATORY_CARE_PROVIDER_SITE_OTHER): Payer: Medicare Other

## 2022-09-16 DIAGNOSIS — M7130 Other bursal cyst, unspecified site: Secondary | ICD-10-CM | POA: Diagnosis not present

## 2022-09-16 DIAGNOSIS — E559 Vitamin D deficiency, unspecified: Secondary | ICD-10-CM

## 2022-09-16 DIAGNOSIS — N289 Disorder of kidney and ureter, unspecified: Secondary | ICD-10-CM | POA: Diagnosis not present

## 2022-09-16 DIAGNOSIS — E7800 Pure hypercholesterolemia, unspecified: Secondary | ICD-10-CM | POA: Diagnosis not present

## 2022-09-16 DIAGNOSIS — R7303 Prediabetes: Secondary | ICD-10-CM | POA: Diagnosis not present

## 2022-09-16 LAB — LIPID PANEL
Cholesterol: 210 mg/dL — ABNORMAL HIGH (ref 0–200)
HDL: 66.9 mg/dL (ref >39.00)
LDL Cholesterol: 124 mg/dL — ABNORMAL HIGH (ref 0–99)
NonHDL: 143.39
Total CHOL/HDL Ratio: 3
Triglycerides: 95 mg/dL (ref 0.0–149.0)
VLDL: 19 mg/dL (ref 0.0–40.0)

## 2022-09-16 LAB — COMPREHENSIVE METABOLIC PANEL
ALT: 12 U/L (ref 0–35)
AST: 16 U/L (ref 0–37)
Albumin: 3.9 g/dL (ref 3.5–5.2)
Alkaline Phosphatase: 73 U/L (ref 39–117)
BUN: 16 mg/dL (ref 6–23)
CO2: 25 mEq/L (ref 19–32)
Calcium: 8.9 mg/dL (ref 8.4–10.5)
Chloride: 105 mEq/L (ref 96–112)
Creatinine, Ser: 0.79 mg/dL (ref 0.40–1.20)
GFR: 66.17 mL/min (ref >60.00)
Glucose, Bld: 97 mg/dL (ref 70–99)
Potassium: 4.2 mEq/L (ref 3.5–5.1)
Sodium: 138 mEq/L (ref 135–145)
Total Bilirubin: 0.6 mg/dL (ref 0.2–1.2)
Total Protein: 6 g/dL (ref 6.0–8.3)

## 2022-09-16 LAB — HEMOGLOBIN A1C: Hgb A1c MFr Bld: 5.4 % (ref 4.6–6.5)

## 2022-09-16 LAB — VITAMIN D 25 HYDROXY (VIT D DEFICIENCY, FRACTURES): VITD: 20.58 ng/mL — ABNORMAL LOW (ref 30.00–100.00)

## 2022-09-23 ENCOUNTER — Encounter: Payer: Self-pay | Admitting: Family Medicine

## 2022-09-23 ENCOUNTER — Ambulatory Visit (INDEPENDENT_AMBULATORY_CARE_PROVIDER_SITE_OTHER): Payer: Medicare Other | Admitting: Family Medicine

## 2022-09-23 VITALS — BP 134/66 | HR 62 | Temp 97.5°F | Ht 65.0 in | Wt 165.2 lb

## 2022-09-23 DIAGNOSIS — H9193 Unspecified hearing loss, bilateral: Secondary | ICD-10-CM

## 2022-09-23 DIAGNOSIS — M7989 Other specified soft tissue disorders: Secondary | ICD-10-CM | POA: Diagnosis not present

## 2022-09-23 DIAGNOSIS — H40003 Preglaucoma, unspecified, bilateral: Secondary | ICD-10-CM | POA: Diagnosis not present

## 2022-09-23 DIAGNOSIS — Z7189 Other specified counseling: Secondary | ICD-10-CM | POA: Diagnosis not present

## 2022-09-23 DIAGNOSIS — E7800 Pure hypercholesterolemia, unspecified: Secondary | ICD-10-CM

## 2022-09-23 DIAGNOSIS — H353130 Nonexudative age-related macular degeneration, bilateral, stage unspecified: Secondary | ICD-10-CM

## 2022-09-23 DIAGNOSIS — I10 Essential (primary) hypertension: Secondary | ICD-10-CM | POA: Diagnosis not present

## 2022-09-23 DIAGNOSIS — K219 Gastro-esophageal reflux disease without esophagitis: Secondary | ICD-10-CM

## 2022-09-23 DIAGNOSIS — Z0000 Encounter for general adult medical examination without abnormal findings: Secondary | ICD-10-CM | POA: Diagnosis not present

## 2022-09-23 DIAGNOSIS — R7303 Prediabetes: Secondary | ICD-10-CM | POA: Diagnosis not present

## 2022-09-23 MED ORDER — AMLODIPINE BESYLATE 5 MG PO TABS: ORAL_TABLET | ORAL | 4 refills | Status: DC

## 2022-09-23 MED ORDER — CARVEDILOL 25 MG PO TABS: 25.0000 mg | ORAL_TABLET | Freq: Two times a day (BID) | ORAL | 4 refills | Status: DC

## 2022-09-23 MED ORDER — VALSARTAN 320 MG PO TABS
320.0000 mg | ORAL_TABLET | Freq: Every day | ORAL | 4 refills | Status: DC
Start: 2022-09-23 — End: 2023-10-07

## 2022-09-23 MED ORDER — HYDROCHLOROTHIAZIDE 12.5 MG PO CAPS: 12.5000 mg | ORAL_CAPSULE | Freq: Every day | ORAL | 4 refills | Status: DC

## 2022-09-23 NOTE — Assessment & Plan Note (Signed)
Previously discussed.

## 2022-09-23 NOTE — Assessment & Plan Note (Signed)
Chronic, stable period on PRN OTC PPI

## 2022-09-23 NOTE — Assessment & Plan Note (Signed)
Appreciate eye doctor care.

## 2022-09-23 NOTE — Assessment & Plan Note (Signed)
A1c remains in normal range - will resolve

## 2022-09-23 NOTE — Assessment & Plan Note (Signed)
Has started wearing hearing aides she ordered online.

## 2022-09-23 NOTE — Assessment & Plan Note (Signed)
Chronic, deteriorated. Reviewed diet choices to improve cholesterol control.  The ASCVD Risk score (Arnett DK, et al., 2019) failed to calculate for the following reasons:   The 2019 ASCVD risk score is only valid for ages 44 to 62

## 2022-09-23 NOTE — Patient Instructions (Addendum)
Blood test today looking for left leg blood clot - if elevated we will proceed with ultrasound. Try pepcid in place of prilosec if needed.  If interested, check with pharmacy about new 2 shot shingles series (shingrix) and RSV.  Medicines refilled today  Good to see you today  Return in 1 year for next wellness visit.

## 2022-09-23 NOTE — Assessment & Plan Note (Signed)
Chronic, stable. Continue current regimen. 

## 2022-09-23 NOTE — Assessment & Plan Note (Signed)
Incidentally noted today, she also notes heavy feeling in evenings.  No recent periods of prolonged immobility, no personal h/o blood clots. Check D dimer today - if elevated will proceed with LLE venous US

## 2022-09-23 NOTE — Assessment & Plan Note (Signed)

## 2022-09-23 NOTE — Progress Notes (Signed)
Ph: [phone] Fax: [phone]   Patient ID: Kristine Owens, female    DOB: [birth year], 89 y.o.   MRN: [id number]  This visit was conducted in person.  BP 134/66   Pulse 62   Temp (!) 97.5 F (36.4 C) (Temporal)   Ht 5\' 5"  (1.651 m)   Wt 165 lb 3.2 oz (74.9 kg)   BMI 27.49 kg/m    CC: AMW Subjective:   HPI: Kristine Owens is a 89 y.o. female presenting on 09/23/2022 for Annual Exam   Did not see health advisor.   No results found.  Flowsheet Row Office Visit from 09/23/2022 in [facility] [place]  PHQ-2 Total Score 0          09/23/2022    8:44 AM 09/17/2021    2:15 PM 02/22/2020   11:46 AM 02/09/2019    9:03 AM 02/01/2018    8:19 AM  Fall Risk   Falls in the past year? 0 0 0 0 0  Number falls in past yr: 0   0   Injury with Fall? 0   0   Risk for fall due to : No Fall Risks   Medication side effect   Follow up Falls evaluation completed   Falls evaluation completed;Falls prevention discussed    Has seen cardiology (Dr Azucena Cecil) for difficult to control HTN management - currently on amlodipine 5mg  daily, carvedilol 25mg  bid, hctz 12.5mg  daily, valsartan 320mg  daily. Last seen 07/2021. We will follow HTN from here.   Has been seeing chiropractor, dealing with left sciatica, xray showed hip and spine arthritis.   Developed ganglion cyst to left index finger DIP.   Preventative: Colon cancer screening - 2010 Christella Hartigan) normal, some ext hemorrhoids. Given h/o adenomatous polyps, rec rpt in 5 yrs. iFOB neg yearly, last 02/2018. Aged out.  [facility] Does breast exams at home. Discussed mammo Q2 yrs.  Well woman exam - s/p total hysterectomy, ovaries removed as well. Aged out.  DEXA - normal 2010.  Flu shot - yearly.  COVID vaccine Pfizer 02/2019, 03/2019, booster 11/2019 (x2) Pneumovax - 2003. Prevnar 2015.  Td - 2013  zostavax - 2011.  shingrix - to check with pharmacy  RSV - to check wit  pharmacy  Advanced directives: scanned 02/2019. Wants daughter Lacinda Axon to be HCPOA. Ok for temporary life support/ CPR/ intubation but does not want prolonged life support if terminal condition. Ok with artifical hydration.  Seat belt use discussed  Sunscreen use discussed. No changing moles. Sees derm yearly Terri Piedra).  Sleep - averaging 8 hours/night  Non smoker  Alcohol - 2 glasses wine nightly  Dentist q6 mo  Eye exam yearly - cataracts removed.  Bowel - no constipation  Bladder - no incontinence  Married/remarried Husband with dx creutzfeldt-jakob prion disease 2017 then memory unit of nursing home - passed away 02-13-16 Lives alone. One daughter Chip Boer, local Occupation: retired, previously enjoyed teaching 2 art classes/wk  Activity: sometimes uses weights, walking  Diet: good water, fruits/vegetables daily     Relevant past medical, surgical, family and social history reviewed and updated as indicated. Interim medical history since our last visit reviewed. Allergies and medications reviewed and updated. Outpatient Medications Prior to Visit  Medication Sig Dispense Refill   aspirin EC 81 MG tablet Take 1 tablet (81 mg total) by mouth daily.     b complex vitamins tablet Take 1 tablet by mouth daily.  cholecalciferol (VITAMIN D) 1000 UNITS tablet Take 1,000 Units by mouth daily.     ibuprofen (ADVIL,MOTRIN) 200 MG tablet Take 200 mg by mouth every 6 (six) hours as needed.     Multiple Vitamin (MULTIVITAMIN) tablet Take 1 tablet by mouth daily.     omeprazole (PRILOSEC OTC) 20 MG tablet Take 20 mg by mouth as needed.     amLODipine (NORVASC) 5 MG tablet TAKE 1 TABLET BY MOUTH EVERYDAY AT BEDTIME 90 tablet 3   carvedilol (COREG) 25 MG tablet TAKE 1 TABLET BY MOUTH TWICE A DAY 180 tablet 3   hydrochlorothiazide (MICROZIDE) 12.5 MG capsule Take 1 capsule (12.5 mg total) by mouth daily. 90 capsule 3   valsartan (DIOVAN) 320 MG tablet TAKE 1 TABLET BY MOUTH EVERY DAY 90 tablet 3    No facility-administered medications prior to visit.     Per HPI unless specifically indicated in ROS section below Review of Systems  Objective:  BP 134/66   Pulse 62   Temp (!) 97.5 F (36.4 C) (Temporal)   Ht 5\' 5"  (1.651 m)   Wt 165 lb 3.2 oz (74.9 kg)   BMI 27.49 kg/m   Wt Readings from Last 3 Encounters:  09/23/22 165 lb 3.2 oz (74.9 kg)  09/17/21 161 lb 2 oz (73.1 kg)  07/26/21 162 lb (73.5 kg)      Physical Exam Vitals and nursing note reviewed.  Constitutional:      Appearance: Normal appearance. She is not ill-appearing.  HENT:     Head: Normocephalic and atraumatic.     Right Ear: Tympanic membrane, ear canal and external ear normal. There is no impacted cerumen.     Left Ear: Tympanic membrane, ear canal and external ear normal. There is no impacted cerumen.     Mouth/Throat:     Mouth: Mucous membranes are moist.     Pharynx: Oropharynx is clear. No oropharyngeal exudate or posterior oropharyngeal erythema.  Eyes:     General:        Right eye: No discharge.        Left eye: No discharge.     Extraocular Movements: Extraocular movements intact.     Conjunctiva/sclera: Conjunctivae normal.     Pupils: Pupils are equal, round, and reactive to light.  Neck:     Thyroid: No thyroid mass or thyromegaly.     Vascular: No carotid bruit.  Cardiovascular:     Rate and Rhythm: Normal rate and regular rhythm.     Pulses: Normal pulses.     Heart sounds: Normal heart sounds. No murmur heard. Pulmonary:     Effort: Pulmonary effort is normal. No respiratory distress.     Breath sounds: Normal breath sounds. No wheezing, rhonchi or rales.  Abdominal:     General: Bowel sounds are normal. There is no distension.     Palpations: Abdomen is soft. There is no mass.     Tenderness: There is no abdominal tenderness. There is no guarding or rebound.     Hernia: No hernia is present.  Musculoskeletal:        General: No tenderness.     Cervical back: Normal range of  motion and neck supple. No rigidity.     Right lower leg: No edema.     Left lower leg: Edema (tr) present.     Comments: LLE swelling present, without palpable cords  Lymphadenopathy:     Cervical: No cervical adenopathy.  Skin:    General: Skin is  warm and dry.     Findings: No rash.  Neurological:     General: No focal deficit present.     Mental Status: She is alert. Mental status is at baseline.  Psychiatric:        Mood and Affect: Mood normal.        Behavior: Behavior normal.       Results for orders placed or performed in visit on 09/16/22  VITAMIN D 25 Hydroxy (Vit-D Deficiency, Fractures)  Result Value Ref Range   VITD 20.58 (L) 30.00 - 100.00 ng/mL  Hemoglobin A1c  Result Value Ref Range   Hgb A1c MFr Bld 5.4 4.6 - 6.5 %  Comprehensive metabolic panel  Result Value Ref Range   Sodium 138 135 - 145 mEq/L   Potassium 4.2 3.5 - 5.1 mEq/L   Chloride 105 96 - 112 mEq/L   CO2 25 19 - 32 mEq/L   Glucose, Bld 97 70 - 99 mg/dL   BUN 16 6 - 23 mg/dL   Creatinine, Ser 1.02 0.40 - 1.20 mg/dL   Total Bilirubin 0.6 0.2 - 1.2 mg/dL   Alkaline Phosphatase 73 39 - 117 U/L   AST 16 0 - 37 U/L   ALT 12 0 - 35 U/L   Total Protein 6.0 6.0 - 8.3 g/dL   Albumin 3.9 3.5 - 5.2 g/dL   GFR 72.53 >66.44 mL/min   Calcium 8.9 8.4 - 10.5 mg/dL  Lipid panel  Result Value Ref Range   Cholesterol 210 (H) 0 - 200 mg/dL   Triglycerides 03.4 0.0 - 149.0 mg/dL   HDL 74.25 >95.63 mg/dL   VLDL 87.5 0.0 - 64.3 mg/dL   LDL Cholesterol 329 (H) 0 - 99 mg/dL   Total CHOL/HDL Ratio 3    NonHDL 143.39     Assessment & Plan:   Problem List Items Addressed This Visit     Medicare annual wellness visit, subsequent - Primary (Chronic)    I have personally reviewed the Medicare Annual Wellness questionnaire and have noted 1. The patient's medical and social history 2. Their use of alcohol, tobacco or illicit drugs 3. Their current medications and supplements 4. The patient's functional ability  including ADL's, fall risks, home safety risks and hearing or visual impairment. Cognitive function has been assessed and addressed as indicated.  5. Diet and physical activity 6. Evidence for depression or mood disorders The patients weight, height, BMI have been recorded in the chart. I have made referrals, counseling and provided education to the patient based on review of the above and I have provided the pt with a written personalized care plan for preventive services. Provider list updated.. See scanned questionairre as needed for further documentation. Reviewed preventative protocols and updated unless pt declined.       Advanced care planning/counseling discussion (Chronic)    Previously discussed.       RESOLVED: Prediabetes    A1c remains in normal range - will resolve      HLD (hyperlipidemia)    Chronic, deteriorated. Reviewed diet choices to improve cholesterol control.  The ASCVD Risk score (Arnett DK, et al., 2019) failed to calculate for the following reasons:   The 2019 ASCVD risk score is only valid for ages 24 to 78       Relevant Medications   carvedilol (COREG) 25 MG tablet   hydrochlorothiazide (MICROZIDE) 12.5 MG capsule   valsartan (DIOVAN) 320 MG tablet   amLODipine (NORVASC) 5 MG tablet   Essential hypertension  Chronic, stable. Continue current regimen.       Relevant Medications   carvedilol (COREG) 25 MG tablet   hydrochlorothiazide (MICROZIDE) 12.5 MG capsule   valsartan (DIOVAN) 320 MG tablet   amLODipine (NORVASC) 5 MG tablet   GERD    Chronic, stable period on PRN OTC PPI      Hearing loss    Has started wearing hearing aides she ordered online.       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  No recent periods of prolonged immobility, no  personal h/o blood clots. Check D dimer today - if elevated will proceed with LLE venous US      Relevant Orders   D-dimer, quantitative     Meds ordered this encounter  Medications   carvedilol (COREG) 25 MG tablet    Sig: Take 1 tablet (25 mg total) by mouth 2 (two) times daily.    Dispense:  180 tablet    Refill:  4   hydrochlorothiazide (MICROZIDE) 12.5 MG capsule    Sig: Take 1 capsule (12.5 mg total) by mouth daily.    Dispense:  90 capsule    Refill:  4   valsartan (DIOVAN) 320 MG tablet    Sig: Take 1 tablet (320 mg total) by mouth daily.    Dispense:  90 tablet    Refill:  4   amLODipine (NORVASC) 5 MG tablet    Sig: TAKE 1 TABLET BY MOUTH EVERYDAY AT BEDTIME    Dispense:  90 tablet    Refill:  4    Orders Placed This Encounter  Procedures   D-dimer, quantitative    Patient Instructions  Blood test today looking for left leg blood clot - if elevated we will proceed with ultrasound. Try pepcid in place of prilosec if needed.  If interested, check with pharmacy about new 2 shot shingles series (shingrix) and RSV.  Medicines refilled today  Good to see you today  Return in 1 year for next wellness visit.  Follow up plan: Return in about 1 year (around 09/23/2023) for medicare wellness visit.  Eustaquio Boyden, MD

## 2022-09-24 ENCOUNTER — Telehealth: Payer: Self-pay

## 2022-09-24 DIAGNOSIS — M7989 Other specified soft tissue disorders: Secondary | ICD-10-CM

## 2022-09-24 DIAGNOSIS — R7989 Other specified abnormal findings of blood chemistry: Secondary | ICD-10-CM

## 2022-09-24 LAB — D-DIMER, QUANTITATIVE: D-Dimer, Quant: 1.19 ug{FEU}/mL — ABNORMAL HIGH (ref <0.50)

## 2022-09-24 NOTE — Telephone Encounter (Signed)
Pt notified as instructed and pt voiced understanding. Pt will wait to hear about appt for [place] from referral team.

## 2022-09-24 NOTE — Telephone Encounter (Signed)
Sending note to Dr Reece Agar and G pool and will take printed report to Dr Timoteo Expose area.

## 2022-09-24 NOTE — Telephone Encounter (Signed)
Please notify D dimer returned elevated - for this reason will proceed with venous US which I've ordered.  Will also forward to referral team.

## 2022-09-24 NOTE — Telephone Encounter (Signed)
I called [facility] to get an appt and the patient was on their other line scheduling at the same time.   No appts available today at any location. Pt was offered an appt tomorrow in [facility] 09/25/22 at 9:40 and she declined, stating she only wants to go to [place].   Pt is scheduled 10/01/2022 which was next available appt in [place]

## 2022-09-30 DIAGNOSIS — M6740 Ganglion, unspecified site: Secondary | ICD-10-CM | POA: Diagnosis not present

## 2022-09-30 DIAGNOSIS — M79645 Pain in left finger(s): Secondary | ICD-10-CM | POA: Diagnosis not present

## 2022-10-01 ENCOUNTER — Ambulatory Visit
Admission: RE | Admit: 2022-10-01 | Discharge: 2022-10-01 | Disposition: A | Discharge status: Home / Self Care | Payer: Medicare Other | Source: Ambulatory Visit | Attending: Family Medicine | Admitting: Family Medicine

## 2022-10-01 DIAGNOSIS — M7989 Other specified soft tissue disorders: Secondary | ICD-10-CM

## 2022-10-01 DIAGNOSIS — R7989 Other specified abnormal findings of blood chemistry: Secondary | ICD-10-CM

## 2022-10-01 DIAGNOSIS — R600 Localized edema: Secondary | ICD-10-CM | POA: Diagnosis not present

## 2022-10-07 DIAGNOSIS — H40013 Open angle with borderline findings, low risk, bilateral: Secondary | ICD-10-CM | POA: Diagnosis not present

## 2022-10-07 DIAGNOSIS — H26493 Other secondary cataract, bilateral: Secondary | ICD-10-CM | POA: Diagnosis not present

## 2022-10-08 ENCOUNTER — Encounter: Payer: Self-pay | Admitting: Family Medicine

## 2022-10-22 DIAGNOSIS — H35341 Macular cyst, hole, or pseudohole, right eye: Secondary | ICD-10-CM | POA: Diagnosis not present

## 2022-10-22 DIAGNOSIS — H26491 Other secondary cataract, right eye: Secondary | ICD-10-CM | POA: Diagnosis not present

## 2022-10-22 DIAGNOSIS — H40013 Open angle with borderline findings, low risk, bilateral: Secondary | ICD-10-CM | POA: Diagnosis not present

## 2022-10-22 DIAGNOSIS — H35373 Puckering of macula, bilateral: Secondary | ICD-10-CM | POA: Diagnosis not present

## 2022-10-22 DIAGNOSIS — H26493 Other secondary cataract, bilateral: Secondary | ICD-10-CM | POA: Diagnosis not present

## 2022-11-11 DIAGNOSIS — H26492 Other secondary cataract, left eye: Secondary | ICD-10-CM | POA: Diagnosis not present

## 2023-03-06 ENCOUNTER — Other Ambulatory Visit: Payer: Self-pay | Admitting: Family Medicine

## 2023-03-06 DIAGNOSIS — Z1231 Encounter for screening mammogram for malignant neoplasm of breast: Secondary | ICD-10-CM

## 2023-03-11 ENCOUNTER — Ambulatory Visit: Payer: Medicare Other | Attending: Cardiology | Admitting: Cardiology

## 2023-03-11 ENCOUNTER — Encounter: Payer: Self-pay | Admitting: Cardiology

## 2023-03-11 VITALS — BP 140/60 | HR 68 | Ht 65.0 in | Wt 164.6 lb

## 2023-03-11 DIAGNOSIS — I10 Essential (primary) hypertension: Secondary | ICD-10-CM | POA: Diagnosis not present

## 2023-03-11 DIAGNOSIS — E7800 Pure hypercholesterolemia, unspecified: Secondary | ICD-10-CM | POA: Diagnosis not present

## 2023-03-11 NOTE — Progress Notes (Signed)
 Cardiology Office Note:    Date:  03/11/2023   ID:  Kristine Owens, DOB 09/05/1930, MRN [id number]  PCP:  Rilla Baller, MD  [facility] Cardiologist:  Redell Cave, MD  [facility] Electrophysiologist:  None   Referring MD: Rilla Baller, MD   Chief Complaint  Patient presents with   Follow-up    Patient denies new or acute cardiac problems/concerns today.      History of Present Illness:    Kristine Owens is a 89 y.o. female with a hx of hypertension, hyperlipidemia who presents for follow-up.  She feels well, denies chest pain or shortness of breath.  Compliant with medications as prescribed.  States BP is usually elevated in the morning with systolics in the 140s.  Otherwise doing okay, no concerns at this time.  [place], husband passed, looking for a retirement community or smaller home.  Has not found anything that she likes yet.   Prior notes  higher doses of amlodipine  caused leg edema.   Past Medical History:  Diagnosis Date   COVID-19 virus infection 09/12/2020   ERM OD (epiretinal membrane, right eye) 2015   also with R choroidal nevus; referred to Dr. Alvia   GERD (gastroesophageal reflux disease) 02/2000   Hyperlipemia 07/2000   Hypertension 01/2001   Hypertensive retinopathy of both eyes    hecker    Past Surgical History:  Procedure Laterality Date   CATARACT EXTRACTION Bilateral 06/2016   [name]   TONSILLECTOMY     TOTAL ABDOMINAL HYSTERECTOMY  1981   w/BSO for fibroids (Dr. Laury)    Current Medications: Current Meds  Medication Sig   amLODipine  (NORVASC ) 5 MG tablet TAKE 1 TABLET BY MOUTH EVERYDAY AT BEDTIME   aspirin  EC 81 MG tablet Take 1 tablet (81 mg total) by mouth daily.   b complex vitamins tablet Take 1 tablet by mouth daily.   carvedilol  (COREG ) 25 MG tablet Take 1 tablet (25 mg total) by mouth 2 (two) times daily.   cholecalciferol (VITAMIN D ) 1000 UNITS tablet Take 1,000 Units by mouth  daily.   hydrochlorothiazide  (MICROZIDE ) 12.5 MG capsule Take 1 capsule (12.5 mg total) by mouth daily.   ibuprofen (ADVIL,MOTRIN) 200 MG tablet Take 200 mg by mouth every 6 (six) hours as needed.   Multiple Vitamin (MULTIVITAMIN) tablet Take 1 tablet by mouth daily.   omeprazole (PRILOSEC OTC) 20 MG tablet Take 20 mg by mouth as needed.   valsartan  (DIOVAN ) 320 MG tablet Take 1 tablet (320 mg total) by mouth daily.     Allergies:   Amlodipine , Codeine sulfate, Maxzide [hydrochlorothiazide  w-triamterene ], and Trazodone  and nefazodone   Social History   Socioeconomic History   Marital status: Widowed    Spouse name: Not on file   Number of children: 1   Years of education: Not on file   Highest education level: Not on file  Occupational History   Occupation: Studio//Artist//Teaches    Employer: RETIRED  Tobacco Use   Smoking status: Never   Smokeless tobacco: Never  Vaping Use   Vaping status: Never Used  Substance and Sexual Activity   Alcohol use: Yes    Alcohol/week: 14.0 standard drinks of alcohol    Types: 14 Glasses of wine per week    Comment: red wine several times a week   Drug use: No   Sexual activity: Yes  Other Topics Concern   Not on file  Social History Narrative   [place]   Married/remarried  Husband with dx creutzfeldt-jakob prion disease 02/05/16 then memory unit of nursing home - passed away February 05, 2016   One daughter, local   Occupation: retired   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   Activity: sometimes uses weights, some walking but limited by bunion   Diet: good water, fruits/vegetables daily   Social Drivers of Corporate Investment Banker Strain: Low Risk  (02/09/2019)   Overall Financial Resource Strain (CARDIA)    Difficulty of Paying Living Expenses: Not hard at all  Food Insecurity: No Food Insecurity (02/09/2019)   Hunger Vital Sign    Worried About Running Out of Food in the Last Year: Never true    Ran Out of Food in the  Last Year: Never true  Transportation Needs: No Transportation Needs (02/09/2019)   PRAPARE - Administrator, Civil Service (Medical): No    Lack of Transportation (Non-Medical): No  Physical Activity: Inactive (02/09/2019)   Exercise Vital Sign    Days of Exercise per Week: 0 days    Minutes of Exercise per Session: 0 min  Stress: No Stress Concern Present (02/09/2019)   [employer] of Occupational Health - Occupational Stress Questionnaire    Feeling of Stress : Not at all  Social Connections: Not on file     Family History: The patient's family history includes Aneurysm in her brother; Breast cancer in her maternal aunt; Cancer in her brother and maternal aunt; Heart disease in her father; Hypertension in her father and mother; Kidney disease in her father.  ROS:   Please see the history of present illness.     All other systems reviewed and are negative.  EKGs/Labs/Other Studies Reviewed:    The following studies were reviewed today:  EKG Interpretation Date/Time:  Wednesday March 11 2023 08:50:41 EST Ventricular Rate:  68 PR Interval:  160 QRS Duration:  80 QT Interval:  402 QTC Calculation: 427 R Axis:   -5  Text Interpretation: Normal sinus rhythm Minimal voltage criteria for LVH, may be normal variant ( R in aVL ) Confirmed by Darliss Rogue ([id number]) on 03/11/2023 8:58:59 AM    Recent Labs: 09/16/2022: ALT 12; BUN 16; Creatinine, Ser 0.79; Potassium 4.2; Sodium 138  Recent Lipid Panel    Component Value Date/Time   CHOL 210 (H) 09/16/2022 0828   TRIG 95.0 09/16/2022 0828   HDL 66.90 09/16/2022 0828   CHOLHDL 3 09/16/2022 0828   VLDL 19.0 09/16/2022 0828   LDLCALC 124 (H) 09/16/2022 0828   LDLDIRECT 121.3 12/16/2011 0837     Risk Assessment/Calculations:      Physical Exam:    VS:  BP (!) 140/60 (BP Location: Left Arm, Patient Position: Sitting, Cuff Size: Large)   Pulse 68   Ht 5' 5 (1.651 m)   Wt 164 lb 9.6 oz (74.7 kg)   SpO2 99%    BMI 27.39 kg/m     Wt Readings from Last 3 Encounters:  03/11/23 164 lb 9.6 oz (74.7 kg)  09/23/22 165 lb 3.2 oz (74.9 kg)  09/17/21 161 lb 2 oz (73.1 kg)     GEN:  Well nourished, well developed in no acute distress HEENT: Normal NECK: No JVD; No carotid bruits CARDIAC: RRR, no murmurs, rubs, gallops RESPIRATORY:  Clear to auscultation without rales, wheezing or rhonchi  ABDOMEN: Soft, non-tender, non-distended MUSCULOSKELETAL:  No edema; No deformity  SKIN: Warm and dry NEUROLOGIC:  Alert and oriented x 3 PSYCHIATRIC:  Normal affect  ASSESSMENT:    1. Primary hypertension   2. Pure hypercholesterolemia    PLAN:    In order of problems listed above:  hypertension, BP systolic slightly elevated, diastolic low normal.  140/60.  Reasonable for age.  Continue HCTZ 12.5 mg daily,  Coreg  25 mg twice daily, valsartan  320 mg daily.   History of hyperlipidemia, continue low-cholesterol diet..    Follow-up yearly   Medication Adjustments/Labs and Tests Ordered: Current medicines are reviewed at length with the patient today.  Concerns regarding medicines are outlined above.  Orders Placed This Encounter  Procedures   EKG 12-Lead    No orders of the defined types were placed in this encounter.    Patient Instructions  Medication Instructions:  - Your physician recommends that you continue on your current medications as directed. Please refer to the Current Medication list given to you today.  *If you need a refill on your cardiac medications before your next appointment, please call your pharmacy*   Lab Work: - none ordered  If you have labs (blood work) drawn today and your tests are completely normal, you will receive your results only by: MyChart Message (if you have MyChart) OR A paper copy in the mail If you have any lab test that is abnormal or we need to change your treatment, we will call you to review the results.   Testing/Procedures: - none  ordered   Follow-Up: At [facility], you and your health needs are our priority.  As part of our continuing mission to provide you with exceptional heart care, we have created designated Provider Care Teams.  These Care Teams include your primary Cardiologist (physician) and Advanced Practice Providers (APPs -  Physician Assistants and Nurse Practitioners) who all work together to provide you with the care you need, when you need it.  We recommend signing up for the patient portal called MyChart.  Sign up information is provided on this After Visit Summary.  MyChart is used to connect with patients for Virtual Visits (Telemedicine).  Patients are able to view lab/test results, encounter notes, upcoming appointments, etc.  Non-urgent messages can be sent to your provider as well.   To learn more about what you can do with MyChart, go to [email or web address].    Your next appointment:   1 year(s)  Provider:   You may see Redell Cave, MD or one of the following Advanced Practice Providers on your designated Care Team:   Lonni Meager, NP Bernardino Bring, PA-C Cadence Franchester, PA-C Tylene Lunch, NP Barnie Hila, NP    Other Instructions N/a         Signed, Redell Cave, MD  03/11/2023 9:30 AM    [facility]

## 2023-03-11 NOTE — Patient Instructions (Signed)
 Medication Instructions:  - Your physician recommends that you continue on your current medications as directed. Please refer to the Current Medication list given to you today.  *If you need a refill on your cardiac medications before your next appointment, please call your pharmacy*   Lab Work: - none ordered  If you have labs (blood work) drawn today and your tests are completely normal, you will receive your results only by: MyChart Message (if you have MyChart) OR A paper copy in the mail If you have any lab test that is abnormal or we need to change your treatment, we will call you to review the results.   Testing/Procedures: - none ordered   Follow-Up: At [facility], you and your health needs are our priority.  As part of our continuing mission to provide you with exceptional heart care, we have created designated Provider Care Teams.  These Care Teams include your primary Cardiologist (physician) and Advanced Practice Providers (APPs -  Physician Assistants and Nurse Practitioners) who all work together to provide you with the care you need, when you need it.  We recommend signing up for the patient portal called "MyChart".  Sign up information is provided on this After Visit Summary.  MyChart is used to connect with patients for Virtual Visits (Telemedicine).  Patients are able to view lab/test results, encounter notes, upcoming appointments, etc.  Non-urgent messages can be sent to your provider as well.   To learn more about what you can do with MyChart, go to [email or web address].    Your next appointment:   1 year(s)  Provider:   You may see Debbe Odea, MD or one of the following Advanced Practice Providers on your designated Care Team:   Nicolasa Ducking, NP Eula Listen, PA-C Cadence Fransico Michael, PA-C Charlsie Quest, NP Carlos Levering, NP    Other Instructions N/a

## 2023-03-31 ENCOUNTER — Ambulatory Visit
Admission: RE | Admit: 2023-03-31 | Discharge: 2023-03-31 | Disposition: A | Discharge status: Home / Self Care | Payer: Medicare Other | Source: Ambulatory Visit | Attending: Family Medicine | Admitting: Family Medicine

## 2023-03-31 DIAGNOSIS — Z1231 Encounter for screening mammogram for malignant neoplasm of breast: Secondary | ICD-10-CM | POA: Diagnosis not present

## 2023-04-14 DIAGNOSIS — L538 Other specified erythematous conditions: Secondary | ICD-10-CM | POA: Diagnosis not present

## 2023-04-14 DIAGNOSIS — D225 Melanocytic nevi of trunk: Secondary | ICD-10-CM | POA: Diagnosis not present

## 2023-04-14 DIAGNOSIS — L821 Other seborrheic keratosis: Secondary | ICD-10-CM | POA: Diagnosis not present

## 2023-04-14 DIAGNOSIS — L570 Actinic keratosis: Secondary | ICD-10-CM | POA: Diagnosis not present

## 2023-04-14 DIAGNOSIS — L738 Other specified follicular disorders: Secondary | ICD-10-CM | POA: Diagnosis not present

## 2023-04-14 DIAGNOSIS — L820 Inflamed seborrheic keratosis: Secondary | ICD-10-CM | POA: Diagnosis not present

## 2023-04-14 DIAGNOSIS — L814 Other melanin hyperpigmentation: Secondary | ICD-10-CM | POA: Diagnosis not present

## 2023-04-22 DIAGNOSIS — H35373 Puckering of macula, bilateral: Secondary | ICD-10-CM | POA: Diagnosis not present

## 2023-04-22 DIAGNOSIS — H353132 Nonexudative age-related macular degeneration, bilateral, intermediate dry stage: Secondary | ICD-10-CM | POA: Diagnosis not present

## 2023-04-22 DIAGNOSIS — H35341 Macular cyst, hole, or pseudohole, right eye: Secondary | ICD-10-CM | POA: Diagnosis not present

## 2023-04-22 DIAGNOSIS — H3554 Dystrophies primarily involving the retinal pigment epithelium: Secondary | ICD-10-CM | POA: Diagnosis not present

## 2023-04-22 DIAGNOSIS — H40013 Open angle with borderline findings, low risk, bilateral: Secondary | ICD-10-CM | POA: Diagnosis not present

## 2023-05-06 ENCOUNTER — Encounter (INDEPENDENT_AMBULATORY_CARE_PROVIDER_SITE_OTHER): Payer: Medicare Other | Admitting: Ophthalmology

## 2023-06-02 ENCOUNTER — Encounter (INDEPENDENT_AMBULATORY_CARE_PROVIDER_SITE_OTHER): Admitting: Ophthalmology

## 2023-06-02 DIAGNOSIS — H353132 Nonexudative age-related macular degeneration, bilateral, intermediate dry stage: Secondary | ICD-10-CM

## 2023-06-02 DIAGNOSIS — D3132 Benign neoplasm of left choroid: Secondary | ICD-10-CM

## 2023-06-02 DIAGNOSIS — H33301 Unspecified retinal break, right eye: Secondary | ICD-10-CM

## 2023-06-02 DIAGNOSIS — H35033 Hypertensive retinopathy, bilateral: Secondary | ICD-10-CM | POA: Diagnosis not present

## 2023-06-02 DIAGNOSIS — I10 Essential (primary) hypertension: Secondary | ICD-10-CM | POA: Diagnosis not present

## 2023-06-02 DIAGNOSIS — H35371 Puckering of macula, right eye: Secondary | ICD-10-CM

## 2023-06-02 DIAGNOSIS — H43813 Vitreous degeneration, bilateral: Secondary | ICD-10-CM | POA: Diagnosis not present

## 2023-08-27 DIAGNOSIS — C44311 Basal cell carcinoma of skin of nose: Secondary | ICD-10-CM | POA: Diagnosis not present

## 2023-08-27 DIAGNOSIS — D485 Neoplasm of uncertain behavior of skin: Secondary | ICD-10-CM | POA: Diagnosis not present

## 2023-09-19 ENCOUNTER — Other Ambulatory Visit: Payer: Self-pay | Admitting: Family Medicine

## 2023-09-30 ENCOUNTER — Ambulatory Visit (INDEPENDENT_AMBULATORY_CARE_PROVIDER_SITE_OTHER)

## 2023-09-30 VITALS — Ht 65.0 in | Wt 164.0 lb

## 2023-09-30 DIAGNOSIS — Z0000 Encounter for general adult medical examination without abnormal findings: Secondary | ICD-10-CM | POA: Diagnosis not present

## 2023-09-30 NOTE — Progress Notes (Signed)
 Subjective:   Kristine Owens is a 89 y.o. who presents for a Medicare Wellness preventive visit.  As a reminder, Annual Wellness Visits don't include a physical exam, and some assessments may be limited, especially if this visit is performed virtually. We may recommend an in-person follow-up visit with your provider if needed.  Visit Complete: Virtual I connected with  Kristine Owens on 09/30/23 by a audio enabled telemedicine application and verified that I am speaking with the correct person using two identifiers.  Patient Location: Home  Provider Location: Office/Clinic  I discussed the limitations of evaluation and management by telemedicine. The patient expressed understanding and agreed to proceed.  Vital Signs: Because this visit was a virtual/telehealth visit, some criteria may be missing or patient reported. Any vitals not documented were not able to be obtained and vitals that have been documented are patient reported.  VideoDeclined- This patient declined Librarian, academic. Therefore the visit was completed with audio only.  Persons Participating in Visit: Patient.  AWV Questionnaire: No: Patient Medicare AWV questionnaire was not completed prior to this visit.  Cardiac Risk Factors include: advanced age (>21men, >18 women);dyslipidemia;hypertension     Objective:    Today's Vitals   09/30/23 0813  Weight: 164 lb (74.4 kg)  Height: 5' 5 (1.651 m)   Body mass index is 27.29 kg/m.     09/30/2023    8:26 AM 02/09/2019    9:02 AM 02/01/2018    8:23 AM 01/23/2016   10:06 AM  Advanced Directives  Does Patient Have a Medical Advance Directive? Yes Yes Yes  Yes   Type of Estate agent of [place];Living will Healthcare Power of [place];Living will Healthcare Power of [place];Living will Healthcare Power of [place];Living will  Copy of Healthcare Power of Attorney in Chart? Yes - validated most recent copy  scanned in chart (See row information) No - copy requested No - copy requested  No - copy requested      Data saved with a previous flowsheet row definition    Current Medications (verified) Outpatient Encounter Medications as of 09/30/2023  Medication Sig   amLODipine  (NORVASC ) 5 MG tablet TAKE 1 TABLET BY MOUTH EVERYDAY AT BEDTIME   aspirin  EC 81 MG tablet Take 1 tablet (81 mg total) by mouth daily.   b complex vitamins tablet Take 1 tablet by mouth daily.   carvedilol  (COREG ) 25 MG tablet TAKE 1 TABLET TWICE DAILY   cholecalciferol (VITAMIN D ) 1000 UNITS tablet Take 1,000 Units by mouth daily.   hydrochlorothiazide  (MICROZIDE ) 12.5 MG capsule TAKE 1 CAPSULE EVERY DAY   ibuprofen (ADVIL,MOTRIN) 200 MG tablet Take 200 mg by mouth every 6 (six) hours as needed.   Multiple Vitamin (MULTIVITAMIN) tablet Take 1 tablet by mouth daily.   omeprazole (PRILOSEC OTC) 20 MG tablet Take 20 mg by mouth as needed.   valsartan  (DIOVAN ) 320 MG tablet Take 1 tablet (320 mg total) by mouth daily.   No facility-administered encounter medications on file as of 09/30/2023.    Allergies (verified) Amlodipine , Codeine sulfate, Maxzide [hydrochlorothiazide -triamterene ], and Trazodone  and nefazodone   History: Past Medical History:  Diagnosis Date   COVID-19 virus infection 09/12/2020   ERM OD (epiretinal membrane, right eye) 2015   also with R choroidal nevus; referred to Dr. Alvia   GERD (gastroesophageal reflux disease) 02/2000   Hyperlipemia 07/2000   Hypertension 01/2001   Hypertensive retinopathy of both eyes    [facility]   Past Surgical History:  Procedure Laterality Date   CATARACT EXTRACTION Bilateral 06/2016   [name]   TONSILLECTOMY     TOTAL ABDOMINAL HYSTERECTOMY  1981   w/BSO for fibroids (Dr. Laury)   Family History  Problem Relation Age of Onset   Hypertension Mother    Heart disease Father        MI, CVD   Kidney disease Father        dialysis   Hypertension Father     Cancer Brother        lung (smoker)   Aneurysm Brother        of aorta X 2   Cancer Maternal Aunt        3 aunts (breast, pancreatic)   Breast cancer Maternal Aunt    Social History   Socioeconomic History   Marital status: Widowed    Spouse name: Not on file   Number of children: 1   Years of education: Not on file   Highest education level: Not on file  Occupational History   Occupation: Studio//Artist//Teaches    Employer: RETIRED  Tobacco Use   Smoking status: Never   Smokeless tobacco: Never  Vaping Use   Vaping status: Never Used  Substance and Sexual Activity   Alcohol use: Yes    Alcohol/week: 14.0 standard drinks of alcohol    Types: 14 Glasses of wine per week    Comment: red wine several times a week   Drug use: No   Sexual activity: Yes  Other Topics Concern   Not on file  Social History Narrative   [place]   Married/remarried   Husband with dx creutzfeldt-jakob prion disease 2017 then memory unit of nursing home - passed away 12-Jan-2016   One daughter, local   Occupation: retired   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   Activity: sometimes uses weights, some walking but limited by bunion   Diet: good water, fruits/vegetables daily   Social Drivers of Corporate investment banker Strain: Low Risk  (09/30/2023)   Overall Financial Resource Strain (CARDIA)    Difficulty of Paying Living Expenses: Not hard at all  Food Insecurity: No Food Insecurity (09/30/2023)   Hunger Vital Sign    Worried About Running Out of Food in the Last Year: Never true    Ran Out of Food in the Last Year: Never true  Transportation Needs: No Transportation Needs (09/30/2023)   PRAPARE - Administrator, Civil Service (Medical): No    Lack of Transportation (Non-Medical): No  Physical Activity: Insufficiently Active (09/30/2023)   Exercise Vital Sign    Days of Exercise per Week: 3 days    Minutes of Exercise per Session: 30 min  Stress: No Stress  Concern Present (09/30/2023)   [employer] of Occupational Health - Occupational Stress Questionnaire    Feeling of Stress: Not at all  Social Connections: Moderately Integrated (09/30/2023)   Social Connection and Isolation Panel    Frequency of Communication with Friends and Family: More than three times a week    Frequency of Social Gatherings with Friends and Family: More than three times a week    Attends Religious Services: More than 4 times per year    Active Member of [employer] or Organizations: Yes    Attends Banker Meetings: 1 to 4 times per year    Marital Status: Widowed    Tobacco Counseling Counseling given: Not Answered    Clinical Intake:  Pre-visit preparation  completed: Yes  Pain : No/denies pain     BMI - recorded: 27.29 Nutritional Status: BMI 25 -29 Overweight Nutritional Risks: None Diabetes: No  Lab Results  Component Value Date   HGBA1C 5.4 09/16/2022   HGBA1C 5.6 09/10/2021   HGBA1C 5.3 02/22/2020     How often do you need to have someone help you when you read instructions, pamphlets, or other written materials from your doctor or pharmacy?: 1 - Never  Interpreter Needed?: No  Comments: lives alone:daughter temp living w/pt Information entered by :: B.Jace Dowe,LPN   Activities of Daily Living     09/30/2023    8:26 AM  In your present state of health, do you have any difficulty performing the following activities:  Hearing? 1  Vision? 0  Difficulty concentrating or making decisions? 0  Walking or climbing stairs? 0  Dressing or bathing? 0  Doing errands, shopping? 0  Preparing Food and eating ? N  Using the Toilet? N  In the past six months, have you accidently leaked urine? N  Do you have problems with loss of bowel control? N  Managing your Medications? N  Managing your Finances? N  Housekeeping or managing your Housekeeping? N    Patient Care Team: Rilla Baller, MD as PCP - General (Family  Medicine) Darliss Rogue, MD as PCP - Cardiology (Cardiology) Ivin Kocher, MD as Consulting Physician (Dermatology) Alvia Norleen BIRCH, MD as Consulting Physician (Ophthalmology) Cleatus Collar, MD as Consulting Physician (Ophthalmology)  I have updated your Care Teams any recent Medical Services you may have received from other providers in the past year.     Assessment:   This is a routine wellness examination for Nikyla.  Hearing/Vision screen Hearing Screening - Comments:: Patient denies any hearing difficulties w/aids Vision Screening - Comments:: Pt says their vision is good with without glasses Dr  Alvia   Goals Addressed             This Visit's Progress    COMPLETED: Increase physical activity       Starting 02/01/2018, I will continue to exercise at least 20 minutes daily.       COMPLETED: Patient Stated       02/09/2019, I will try to exercise more on a daily basis.      Patient Stated       I would like to exercise more       Depression Screen     09/30/2023    8:22 AM 09/23/2022    8:44 AM 09/17/2021    2:16 PM 02/22/2020   11:46 AM 02/09/2019    9:04 AM 02/01/2018    8:19 AM 01/29/2017    9:46 AM  PHQ 2/9 Scores  PHQ - 2 Score 0 0 0 0 0 0 0  PHQ- 9 Score  2   0 0     Fall Risk     09/30/2023    8:17 AM 09/23/2022    8:44 AM 09/17/2021    2:15 PM 02/22/2020   11:46 AM 02/09/2019    9:03 AM  Fall Risk   Falls in the past year? 0 0 0 0 0   Number falls in past yr: 0 0   0   Injury with Fall? 0 0   0  Risk for fall due to : No Fall Risks No Fall Risks   Medication side effect  Follow up Education provided;Falls prevention discussed Falls evaluation completed   Falls evaluation completed;Falls prevention discussed  Data saved with a previous flowsheet row definition    MEDICARE RISK AT HOME:  Medicare Risk at Home Any stairs in or around the home?: Yes If so, are there any without handrails?: Yes Home free of loose throw rugs in  walkways, pet beds, electrical cords, etc?: Yes Adequate lighting in your home to reduce risk of falls?: Yes Life alert?: Yes (i-watch) Use of a cane, walker or w/c?: No Grab bars in the bathroom?: Yes Shower chair or bench in shower?: Yes Elevated toilet seat or a handicapped toilet?: No  TIMED UP AND GO:  Was the test performed?  No  Cognitive Function: 6CIT completed    02/09/2019    9:06 AM 02/01/2018    8:23 AM 01/23/2016   10:21 AM  MMSE - Mini Mental State Exam  Orientation to time 5 5 5    Orientation to Place 5 5 5    Registration 3 3 3    Attention/ Calculation 5 0 0   Recall 3 3 3    Language- name 2 objects  0 0   Language- repeat 1 1 1   Language- follow 3 step command  3 3   Language- read & follow direction  0 0   Write a sentence  0 0   Copy design  0 0   Total score  20 20      Data saved with a previous flowsheet row definition        09/30/2023    8:27 AM  6CIT Screen  What Year? 0 points  What month? 0 points  What time? 0 points  Count back from 20 0 points  Months in reverse 0 points  Repeat phrase 2 points  Total Score 2 points    Immunizations Immunization History  Administered Date(s) Administered   Influenza Whole 12/03/2006, 11/18/2007, 11/20/2008   Influenza,inj,Quad PF,6+ Mos 12/23/2013, 10/20/2014, 02/01/2016, 01/29/2017, 11/12/2017, 11/09/2018, 10/20/2019   Influenza-Unspecified 11/17/2012   PFIZER(Purple Top)SARS-COV-2 Vaccination 02/14/2019, 03/07/2019, 11/29/2019   Pneumococcal Conjugate-13 12/23/2013   Pneumococcal Polysaccharide-23 11/05/2001   Td 11/06/2000, 12/23/2011   Zoster, Live 12/13/2009    Screening Tests Health Maintenance  Topic Date Due   Zoster Vaccines- Shingrix (1 of 2) 12/21/1982   DTaP/Tdap/Td (3 - Tdap) 12/22/2021   COVID-19 Vaccine (4 - 2024-25 season) 10/05/2022   INFLUENZA VACCINE  09/04/2023   MAMMOGRAM  03/30/2024   Medicare Annual Wellness (AWV)  09/29/2024   Pneumococcal Vaccine: 50+ Years   Completed   DEXA SCAN  Completed   HPV VACCINES  Aged Out   Meningococcal B Vaccine  Aged Out    Health Maintenance  Health Maintenance Due  Topic Date Due   Zoster Vaccines- Shingrix (1 of 2) 12/21/1982   DTaP/Tdap/Td (3 - Tdap) 12/22/2021   COVID-19 Vaccine (4 - 2024-25 season) 10/05/2022   INFLUENZA VACCINE  09/04/2023   Health Maintenance Items Addressed: None at this time  Additional Screening:  Vision Screening: Recommended annual ophthalmology exams for early detection of glaucoma and other disorders of the eye. Would you like a referral to an eye doctor? No    Dental Screening: Recommended annual dental exams for proper oral hygiene  Community Resource Referral / Chronic Care Management: CRR required this visit?  No   CCM required this visit?  Appt scheduled with PCP   Plan:    I have personally reviewed and noted the following in the patient's chart:   Medical and social history Use of alcohol, tobacco or illicit drugs  Current medications  and supplements including opioid prescriptions. Patient is not currently taking opioid prescriptions. Functional ability and status Nutritional status Physical activity Advanced directives List of other physicians Hospitalizations, surgeries, and ER visits in previous 12 months Vitals Screenings to include cognitive, depression, and falls Referrals and appointments  In addition, I have reviewed and discussed with patient certain preventive protocols, quality metrics, and best practice recommendations. A written personalized care plan for preventive services as well as general preventive health recommendations were provided to patient.   Erminio LITTIE Saris, LPN   [id number]   After Visit Summary: (MyChart) Due to this being a telephonic visit, the after visit summary with patients personalized plan was offered to patient via MyChart   Notes: Pt states she has cough and congestion for a week or dn:fjiz appt to see PCP

## 2023-09-30 NOTE — Patient Instructions (Signed)
 Kristine Owens , Thank you for taking time out of your busy schedule to complete your Annual Wellness Visit with me. I enjoyed our conversation and look forward to speaking with you again next year. I, as well as your care team,  appreciate your ongoing commitment to your health goals. Please review the following plan we discussed and let me know if I can assist you in the future. Your Game plan/ To Do List    Referrals: If you haven't heard from the office you've been referred to, please reach out to them at the phone provided.   Follow up Visits: We will see or speak with you next year for your Next Medicare AWV with our clinical staff Have you seen your provider in the last 6 months (3 months if uncontrolled diabetes)? No  Clinician Recommendations:  Aim for 30 minutes of exercise or brisk walking, 6-8 glasses of water, and 5 servings of fruits and vegetables each day.       This is a list of the screenings recommended for you:  Health Maintenance  Topic Date Due   Zoster (Shingles) Vaccine (1 of 2) 12/21/1982   DTaP/Tdap/Td vaccine (3 - Tdap) 12/22/2021   COVID-19 Vaccine (4 - 2024-25 season) 10/05/2022   Flu Shot  09/04/2023   Mammogram  03/30/2024   Medicare Annual Wellness Visit  09/29/2024   Pneumococcal Vaccine for age over 40  Completed   DEXA scan (bone density measurement)  Completed   HPV Vaccine  Aged Out   Meningitis B Vaccine  Aged Out    Advanced directives: (In Chart) A copy of your advanced directives are scanned into your chart should your provider ever need it. Advance Care Planning is important because it:  [x]  Makes sure you receive the medical care that is consistent with your values, goals, and preferences  [x]  It provides guidance to your family and loved ones and reduces their decisional burden about whether or not they are making the right decisions based on your wishes.  Follow the link provided in your after visit summary or read over the paperwork we have  mailed to you to help you started getting your Advance Directives in place. If you need assistance in completing these, please reach out to us  so that we can help you!

## 2023-10-07 ENCOUNTER — Other Ambulatory Visit: Payer: Self-pay | Admitting: Family Medicine

## 2023-10-07 ENCOUNTER — Ambulatory Visit (INDEPENDENT_AMBULATORY_CARE_PROVIDER_SITE_OTHER): Admitting: Family Medicine

## 2023-10-07 ENCOUNTER — Encounter: Payer: Self-pay | Admitting: Family Medicine

## 2023-10-07 VITALS — BP 158/68 | HR 64 | Temp 97.9°F | Ht 65.0 in | Wt 159.4 lb

## 2023-10-07 DIAGNOSIS — I10 Essential (primary) hypertension: Secondary | ICD-10-CM | POA: Diagnosis not present

## 2023-10-07 DIAGNOSIS — R053 Chronic cough: Secondary | ICD-10-CM | POA: Diagnosis not present

## 2023-10-07 DIAGNOSIS — K219 Gastro-esophageal reflux disease without esophagitis: Secondary | ICD-10-CM | POA: Diagnosis not present

## 2023-10-07 MED ORDER — BENZONATATE 100 MG PO CAPS: 100.0000 mg | ORAL_CAPSULE | Freq: Two times a day (BID) | ORAL | 0 refills | Status: DC | PRN

## 2023-10-07 MED ORDER — CARVEDILOL 25 MG PO TABS: 25.0000 mg | ORAL_TABLET | Freq: Two times a day (BID) | ORAL | 0 refills | Status: DC

## 2023-10-07 MED ORDER — HYDROCHLOROTHIAZIDE 12.5 MG PO CAPS: 12.5000 mg | ORAL_CAPSULE | Freq: Every day | ORAL | 0 refills | Status: DC

## 2023-10-07 MED ORDER — VALSARTAN 320 MG PO TABS: 320.0000 mg | ORAL_TABLET | Freq: Every day | ORAL | 0 refills | Status: DC

## 2023-10-07 NOTE — Assessment & Plan Note (Signed)
 Elevated in office today Antihypertensives refilled.

## 2023-10-07 NOTE — Assessment & Plan Note (Signed)
 Start 2 wk omeprazole 20mg  daily course. Update with effect on cough

## 2023-10-07 NOTE — Assessment & Plan Note (Signed)
 Reassuring exam Rx tessalon  perls PRN ?allergy related - rec HEPA filter vacuum, hypoallergenic pillows/covers.  ?GERD related - Rx 2 wk omeprazole 20mg  daily.  If ongoing cough, will update me to return for CXR which I will order without needing OV for this.  Update sooner if worsening symptoms.

## 2023-10-07 NOTE — Progress Notes (Signed)
 Ph: [phone] Fax: [phone]   Patient ID: Kristine Owens, female    DOB: Jul 17, 1930, 89 y.o.   MRN: [id number]  This visit was conducted in person.  BP (!) 158/68 (BP Location: Left Arm, Cuff Size: Normal)   Pulse 64   Temp 97.9 F (36.6 C) (Oral)   Ht 5' 5 (1.651 m)   Wt 159 lb 6 oz (72.3 kg)   SpO2 100%   BMI 26.52 kg/m   BP Readings from Last 3 Encounters:  10/07/23 (!) 158/68  03/11/23 (!) 140/60  09/23/22 134/66   Chief Complaint  Patient presents with   Cough    Pt states she has had the cough all summer, but now have some congestion.    Subjective:   Discussed the use of AI scribe software for clinical note transcription with the patient, who gave verbal consent to proceed.  History of Present Illness   Kristine Owens is a 89 year old female who presents with a chronic cough persisting for three months.  She has a non-productive cough for three months, starting in June, with a sensation of a 'tickle in the back of the throat' and chest congestion. The cough is more pronounced in the mornings but does not disturb her sleep. There is no sore throat, fever, chills, shortness of breath, wheezing, ear pain, tooth pain, headaches, sneezing, or runny nose. She has allergies but is not on medication for them. Over-the-counter cough syrup and cough drops provide temporary relief.  She takes Prilosec (omeprazole) 20 mg as needed, about once a week, and is on four blood pressure medications, including valsartan . She has an intolerance to lisinopril due to a previous cough reaction.  Her daughter recently had COVID-19, but she did not contract it. 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          Relevant past medical, surgical, family and social history reviewed and updated as indicated. Interim medical history since our last visit reviewed. Allergies and medications reviewed and updated. Outpatient  Medications Prior to Visit  Medication Sig Dispense Refill   amLODipine  (NORVASC ) 5 MG tablet TAKE 1 TABLET AT BEDTIME 90 tablet 0   aspirin  EC 81 MG tablet Take 1 tablet (81 mg total) by mouth daily.     b complex vitamins tablet Take 1 tablet by mouth daily.     cholecalciferol (VITAMIN D ) 1000 UNITS tablet Take 1,000 Units by mouth daily.     ibuprofen (ADVIL,MOTRIN) 200 MG tablet Take 200 mg by mouth every 6 (six) hours as needed.     Multiple Vitamin (MULTIVITAMIN) tablet Take 1 tablet by mouth daily.     omeprazole (PRILOSEC OTC) 20 MG tablet Take 20 mg by mouth as needed.     carvedilol  (COREG ) 25 MG tablet TAKE 1 TABLET TWICE DAILY 180 tablet 0   hydrochlorothiazide  (MICROZIDE ) 12.5 MG capsule TAKE 1 CAPSULE EVERY DAY 90 capsule 0   valsartan  (DIOVAN ) 320 MG tablet Take 1 tablet (320 mg total) by mouth daily. 90 tablet 4   No facility-administered medications prior to visit.     Per HPI unless specifically indicated in ROS section below Review of Systems  Objective:  BP (!) 158/68 (BP Location: Left Arm, Cuff Size: Normal)   Pulse 64   Temp 97.9 F (36.6 C) (Oral)   Ht 5' 5 (1.651 m)   Wt 159 lb 6 oz (72.3 kg)   SpO2 100%  BMI 26.52 kg/m   Wt Readings from Last 3 Encounters:  10/07/23 159 lb 6 oz (72.3 kg)  09/30/23 164 lb (74.4 kg)  03/11/23 164 lb 9.6 oz (74.7 kg)      Physical Exam Vitals and nursing note reviewed.  Constitutional:      Appearance: Normal appearance. She is not ill-appearing.  HENT:     Head: Normocephalic and atraumatic.     Right Ear: Tympanic membrane, ear canal and external ear normal. There is no impacted cerumen.     Left Ear: Tympanic membrane, ear canal and external ear normal. There is no impacted cerumen.     Nose: Nose normal. No mucosal edema, congestion or rhinorrhea.     Right Turbinates: Not enlarged.     Left Turbinates: Not enlarged.     Mouth/Throat:     Mouth: Mucous membranes are moist.     Pharynx: Oropharynx is clear.  No oropharyngeal exudate or posterior oropharyngeal erythema.  Eyes:     Extraocular Movements: Extraocular movements intact.     Conjunctiva/sclera: Conjunctivae normal.     Pupils: Pupils are equal, round, and reactive to light.  Cardiovascular:     Rate and Rhythm: Normal rate and regular rhythm.     Pulses: Normal pulses.     Heart sounds: Normal heart sounds. No murmur heard. Pulmonary:     Effort: Pulmonary effort is normal. No respiratory distress.     Breath sounds: Normal breath sounds. No wheezing, rhonchi or rales.  Lymphadenopathy:     Head:     Right side of head: No submental, submandibular, tonsillar, preauricular or posterior auricular adenopathy.     Left side of head: No submental, submandibular, tonsillar, preauricular or posterior auricular adenopathy.     Cervical: No cervical adenopathy.     Right cervical: No superficial cervical adenopathy.    Left cervical: No superficial cervical adenopathy.     Upper Body:     Right upper body: No supraclavicular adenopathy.     Left upper body: No supraclavicular adenopathy.  Skin:    Findings: No rash.  Neurological:     Mental Status: She is alert.  Psychiatric:        Mood and Affect: Mood normal.        Behavior: Behavior normal.       Assessment & Plan:      Chronic cough Chronic cough for three months, non-productive, worse in mornings. No infection signs. Possible causes: allergies, reflux, or valsartan  side effect.  - Initiate omeprazole 20 mg daily for 1-2 weeks to assess for improvement. - Consider chest x-ray if cough persists after omeprazole trial. - Prescribe Tessalon  Perles for daytime cough suppression as needed. - Advise on environmental modifications such as hypoallergenic pillow covers and HEPA filter.  Hypertension Blood pressure elevated at 160/64 and 158/68. On four antihypertensives including valsartan . - Advise home blood pressure monitoring for consistent elevation assessment.        Problem List Items Addressed This Visit     Essential hypertension   Elevated in office today Antihypertensives refilled.       Relevant Medications   valsartan  (DIOVAN ) 320 MG tablet   carvedilol  (COREG ) 25 MG tablet   hydrochlorothiazide  (MICROZIDE ) 12.5 MG capsule   GERD   Start 2 wk omeprazole 20mg  daily course. Update with effect on cough      Chronic cough - Primary   Reassuring exam Rx tessalon  perls PRN ?allergy related - rec HEPA filter vacuum, hypoallergenic pillows/covers.  ?  GERD related - Rx 2 wk omeprazole 20mg  daily.  If ongoing cough, will update me to return for CXR which I will order without needing OV for this.  Update sooner if worsening symptoms.         Meds ordered this encounter  Medications   benzonatate  (TESSALON ) 100 MG capsule    Sig: Take 1 capsule (100 mg total) by mouth 2 (two) times daily as needed for cough.    Dispense:  30 capsule    Refill:  0   valsartan  (DIOVAN ) 320 MG tablet    Sig: Take 1 tablet (320 mg total) by mouth daily.    Dispense:  90 tablet    Refill:  0   carvedilol  (COREG ) 25 MG tablet    Sig: Take 1 tablet (25 mg total) by mouth 2 (two) times daily.    Dispense:  180 tablet    Refill:  0    Patient needs appointment for further refills   hydrochlorothiazide  (MICROZIDE ) 12.5 MG capsule    Sig: Take 1 capsule (12.5 mg total) by mouth daily.    Dispense:  90 capsule    Refill:  0    No orders of the defined types were placed in this encounter.   Patient Instructions  Good to see you today   VISIT SUMMARY: You came in today because of a chronic cough that has been bothering you for the past three months. We discussed possible causes and treatment options. We also reviewed your blood pressure management.  YOUR PLAN: -CHRONIC COUGH: A chronic cough is a cough that lasts for an extended period, in this case, three months. It can be caused by various factors such as allergies, acid reflux, or medication side  effects. We will start you on omeprazole 20 mg daily for 2 weeks to see if it helps. If the cough persists, we may need to do a chest x-ray. You can also use Tessalon  Perles as needed to help suppress the cough during the day. Additionally, consider using hypoallergenic pillow covers and a HEPA filter to reduce potential allergens in your environment.  -HYPERTENSION: Hypertension is high blood pressure, which can increase the risk of heart disease and stroke. Your blood pressure readings today were elevated. We recommend that you monitor your blood pressure at home to see if it remains consistently high.  INSTRUCTIONS: Please start taking omeprazole 20 mg daily for 2 weeks and monitor your cough. Use Tessalon  Perles as needed for daytime cough suppression. Monitor your blood pressure at home and keep a record of your readings. If your cough does not improve after the omeprazole trial, let us  know to come in for a chest xray   Follow up plan: Return in about 3 months (around 01/06/2024), or if symptoms worsen or fail to improve.  Anton Blas, MD

## 2023-10-07 NOTE — Patient Instructions (Addendum)
 Good to see you today   VISIT SUMMARY: You came in today because of a chronic cough that has been bothering you for the past three months. We discussed possible causes and treatment options. We also reviewed your blood pressure management.  YOUR PLAN: -CHRONIC COUGH: A chronic cough is a cough that lasts for an extended period, in this case, three months. It can be caused by various factors such as allergies, acid reflux, or medication side effects. We will start you on omeprazole 20 mg daily for 2 weeks to see if it helps. If the cough persists, we may need to do a chest x-ray. You can also use Tessalon  Perles as needed to help suppress the cough during the day. Additionally, consider using hypoallergenic pillow covers and a HEPA filter to reduce potential allergens in your environment.  -HYPERTENSION: Hypertension is high blood pressure, which can increase the risk of heart disease and stroke. Your blood pressure readings today were elevated. We recommend that you monitor your blood pressure at home to see if it remains consistently high.  INSTRUCTIONS: Please start taking omeprazole 20 mg daily for 2 weeks and monitor your cough. Use Tessalon  Perles as needed for daytime cough suppression. Monitor your blood pressure at home and keep a record of your readings. If your cough does not improve after the omeprazole trial, let us  know to come in for a chest xray

## 2023-10-13 DIAGNOSIS — C44311 Basal cell carcinoma of skin of nose: Secondary | ICD-10-CM | POA: Diagnosis not present

## 2023-10-17 DIAGNOSIS — M25521 Pain in right elbow: Secondary | ICD-10-CM | POA: Diagnosis not present

## 2023-10-17 DIAGNOSIS — M25511 Pain in right shoulder: Secondary | ICD-10-CM | POA: Diagnosis not present

## 2023-10-19 DIAGNOSIS — S51812A Laceration without foreign body of left forearm, initial encounter: Secondary | ICD-10-CM | POA: Diagnosis not present

## 2023-10-19 DIAGNOSIS — S51012A Laceration without foreign body of left elbow, initial encounter: Secondary | ICD-10-CM | POA: Diagnosis not present

## 2023-10-19 DIAGNOSIS — M25521 Pain in right elbow: Secondary | ICD-10-CM | POA: Diagnosis not present

## 2023-10-27 DIAGNOSIS — S51012D Laceration without foreign body of left elbow, subsequent encounter: Secondary | ICD-10-CM | POA: Diagnosis not present

## 2023-10-27 DIAGNOSIS — S51812D Laceration without foreign body of left forearm, subsequent encounter: Secondary | ICD-10-CM | POA: Diagnosis not present

## 2023-10-28 ENCOUNTER — Other Ambulatory Visit: Payer: Self-pay | Admitting: Family Medicine

## 2023-10-28 DIAGNOSIS — I10 Essential (primary) hypertension: Secondary | ICD-10-CM

## 2023-10-29 DIAGNOSIS — H40013 Open angle with borderline findings, low risk, bilateral: Secondary | ICD-10-CM | POA: Diagnosis not present

## 2023-11-05 DIAGNOSIS — M19011 Primary osteoarthritis, right shoulder: Secondary | ICD-10-CM | POA: Diagnosis not present

## 2023-11-10 DIAGNOSIS — S51012A Laceration without foreign body of left elbow, initial encounter: Secondary | ICD-10-CM | POA: Diagnosis not present

## 2023-11-10 DIAGNOSIS — S51812A Laceration without foreign body of left forearm, initial encounter: Secondary | ICD-10-CM | POA: Diagnosis not present

## 2023-12-19 ENCOUNTER — Other Ambulatory Visit: Payer: Self-pay | Admitting: Family Medicine

## 2023-12-19 DIAGNOSIS — I10 Essential (primary) hypertension: Secondary | ICD-10-CM

## 2024-01-03 ENCOUNTER — Other Ambulatory Visit: Payer: Self-pay | Admitting: Family Medicine

## 2024-01-03 DIAGNOSIS — I10 Essential (primary) hypertension: Secondary | ICD-10-CM

## 2024-01-04 NOTE — Telephone Encounter (Signed)
 E-scribed refill.   Pls schedule annual exam and fasting labs for additional refills.

## 2024-01-05 DIAGNOSIS — L570 Actinic keratosis: Secondary | ICD-10-CM | POA: Diagnosis not present

## 2024-01-05 DIAGNOSIS — Z85828 Personal history of other malignant neoplasm of skin: Secondary | ICD-10-CM | POA: Diagnosis not present

## 2024-01-05 DIAGNOSIS — L821 Other seborrheic keratosis: Secondary | ICD-10-CM | POA: Diagnosis not present

## 2024-01-05 DIAGNOSIS — L814 Other melanin hyperpigmentation: Secondary | ICD-10-CM | POA: Diagnosis not present

## 2024-01-05 DIAGNOSIS — Z08 Encounter for follow-up examination after completed treatment for malignant neoplasm: Secondary | ICD-10-CM | POA: Diagnosis not present

## 2024-01-19 ENCOUNTER — Ambulatory Visit: Admitting: Family Medicine

## 2024-01-19 ENCOUNTER — Encounter: Payer: Self-pay | Admitting: Family Medicine

## 2024-01-19 VITALS — BP 126/70 | HR 96 | Temp 97.9°F | Ht 65.0 in | Wt 156.6 lb

## 2024-01-19 DIAGNOSIS — N289 Disorder of kidney and ureter, unspecified: Secondary | ICD-10-CM | POA: Diagnosis not present

## 2024-01-19 DIAGNOSIS — I10 Essential (primary) hypertension: Secondary | ICD-10-CM

## 2024-01-19 DIAGNOSIS — Z7189 Other specified counseling: Secondary | ICD-10-CM | POA: Diagnosis not present

## 2024-01-19 DIAGNOSIS — E559 Vitamin D deficiency, unspecified: Secondary | ICD-10-CM | POA: Insufficient documentation

## 2024-01-19 DIAGNOSIS — Z23 Encounter for immunization: Secondary | ICD-10-CM | POA: Diagnosis not present

## 2024-01-19 DIAGNOSIS — E7800 Pure hypercholesterolemia, unspecified: Secondary | ICD-10-CM

## 2024-01-19 LAB — CBC WITH DIFFERENTIAL/PLATELET
Basophils Absolute: 0.1 K/uL (ref 0.0–0.1)
Basophils Relative: 1.1 % (ref 0.0–3.0)
Eosinophils Absolute: 0 K/uL (ref 0.0–0.7)
Eosinophils Relative: 0.8 % (ref 0.0–5.0)
HCT: 38.5 % (ref 36.0–46.0)
Hemoglobin: 12.9 g/dL (ref 12.0–15.0)
Lymphocytes Relative: 21.4 % (ref 12.0–46.0)
Lymphs Abs: 1.3 K/uL (ref 0.7–4.0)
MCHC: 33.5 g/dL (ref 30.0–36.0)
MCV: 92.6 fl (ref 78.0–100.0)
Monocytes Absolute: 0.5 K/uL (ref 0.1–1.0)
Monocytes Relative: 8.4 % (ref 3.0–12.0)
Neutro Abs: 4 K/uL (ref 1.4–7.7)
Neutrophils Relative %: 68.3 % (ref 43.0–77.0)
Platelets: 236 K/uL (ref 150.0–400.0)
RBC: 4.15 Mil/uL (ref 3.87–5.11)
RDW: 14.1 % (ref 11.5–15.5)
WBC: 5.9 K/uL (ref 4.0–10.5)

## 2024-01-19 LAB — COMPREHENSIVE METABOLIC PANEL WITH GFR
ALT: 13 U/L (ref 3–35)
AST: 15 U/L (ref 5–37)
Albumin: 4 g/dL (ref 3.5–5.2)
Alkaline Phosphatase: 77 U/L (ref 39–117)
BUN: 14 mg/dL (ref 6–23)
CO2: 28 meq/L (ref 19–32)
Calcium: 9.2 mg/dL (ref 8.4–10.5)
Chloride: 104 meq/L (ref 96–112)
Creatinine, Ser: 0.73 mg/dL (ref 0.40–1.20)
GFR: 72.07 mL/min (ref >60.00)
Glucose, Bld: 89 mg/dL (ref 70–99)
Potassium: 3.7 meq/L (ref 3.5–5.1)
Sodium: 138 meq/L (ref 135–145)
Total Bilirubin: 0.6 mg/dL (ref 0.2–1.2)
Total Protein: 6.4 g/dL (ref 6.0–8.3)

## 2024-01-19 LAB — LIPID PANEL
Cholesterol: 194 mg/dL (ref 28–200)
HDL: 73.4 mg/dL (ref >39.00)
LDL Cholesterol: 101 mg/dL — ABNORMAL HIGH (ref 10–99)
NonHDL: 120.65
Total CHOL/HDL Ratio: 3
Triglycerides: 97 mg/dL (ref 10.0–149.0)
VLDL: 19.4 mg/dL (ref 0.0–40.0)

## 2024-01-19 LAB — VITAMIN D 25 HYDROXY (VIT D DEFICIENCY, FRACTURES): VITD: 35.03 ng/mL (ref 30.00–100.00)

## 2024-01-19 MED ORDER — HYDROCHLOROTHIAZIDE 12.5 MG PO CAPS: 12.5000 mg | ORAL_CAPSULE | Freq: Every day | ORAL | 3 refills | Status: AC

## 2024-01-19 MED ORDER — CARVEDILOL 25 MG PO TABS: 25.0000 mg | ORAL_TABLET | Freq: Two times a day (BID) | ORAL | 3 refills | Status: AC

## 2024-01-19 MED ORDER — VALSARTAN 320 MG PO TABS: 320.0000 mg | ORAL_TABLET | Freq: Every day | ORAL | 3 refills | Status: AC

## 2024-01-19 NOTE — Addendum Note (Signed)
 Addended by: BAXTER PULLER on: 01/19/2024 02:12 PM   Modules accepted: Orders

## 2024-01-19 NOTE — Assessment & Plan Note (Signed)
 Previusly discussed.

## 2024-01-19 NOTE — Patient Instructions (Addendum)
 Labs today Flu shot and prevnar-20 pneumonia shot today  BP medicines refilled today.  You are doing well today Try exercises provided today.  Return as needed or in 1 year for next wellness visit

## 2024-01-19 NOTE — Progress Notes (Addendum)
 Ph: [phone] Fax: [phone]   Patient ID: Kristine Owens, female    DOB: January 29, 1931, 89 y.o.   MRN: [id number]  This visit was conducted in person.  BP 126/70 (Cuff Size: Normal)   Pulse 96   Temp 97.9 F (36.6 C) (Oral)   Ht 5' 5 (1.651 m)   Wt 156 lb 9.6 oz (71 kg)   SpO2 96%   BMI 26.06 kg/m    CC: CPE Subjective:   HPI: Atiya Nobuko Owens is a 89 y.o. female presenting on 01/19/2024 for Annual Exam (Left leg pain and right shoulder pain, comes and goes, had a fall back in august)   Saw health advisor 09/2023 for medicare wellness visit. Note reviewed.   Daughter had melanoma metastatic to brain s/p surgery summer 2025. She is recovering well - on immunotherapy.   No results found.  Flowsheet Row Office Visit from 01/19/2024 in [facility]  PHQ-2 Total Score 0       01/19/2024    9:45 AM 10/07/2023   11:44 AM 09/30/2023    8:17 AM 09/23/2022    8:44 AM 09/17/2021    2:15 PM  Fall Risk   Falls in the past year? 1 0 0 0 0  Number falls in past yr: 0 0 0 0   Injury with Fall? 1  0  0    Risk for fall due to :   No Fall Risks No Fall Risks   Follow up Falls evaluation completed  Education provided;Falls prevention discussed Falls evaluation completed      Data saved with a previous flowsheet row definition  Walking down stairs at home tripped on last step landed on R elbow s/p laceration repair treated by [facility]).   Not fully fasting today   Sees cardiology (Dr Darliss) for difficult to control HTN management - doing better with new regimen.   Open angle with borderline findings - followed by eye doctor Dr Cleatus --> [facility], not on eye drops. Also sees retina specialist Alvia for freckle to retina.   L lateral leg pain sciatica flare comes and goes, started bothering her this summer. She has been seeing chiropractor with benefit. Prolonged seating at [employer] on  Sunday.   R shoulder pain - also comes and goes - she did hae fall onto elbow summer 10/2023 which flared up shoulder pain. Has not had physical therapy. Points to R anterior shoulder.   Preventative: Colon cancer screening - 2010 [name]) normal, some ext hemorrhoids. Given h/o adenomatous polyps, rec rpt in 5 yrs. iFOB neg yearly, last 02/2018. Aged out.  [facility] Breast exams at home. Discussed mammo Q2 yrs.  Well woman exam - s/p total hysterectomy, ovaries removed as well. Aged out.  DEXA - normal 2010.  Flu shot - yearly.  COVID vaccine Pfizer 02/2019, 03/2019, booster 11/2019 (x2) Pneumovax - 2003. Prevnar-13 2015. Prevnar-20 today Td - 2013  zostavax - 2011.  shingrix - to check with pharmacy  RSV - to check wit pharmacy  Advanced directives: scanned 02/2019. Wants daughter Kristine Owens to be HCPOA. Ok for temporary life support/ CPR/ intubation but does not want prolonged life support if terminal condition. Ok with artifical hydration.  Seat belt use discussed  Sunscreen use discussed. No changing moles. Sees derm yearly [name]) s/p BCC removed from nose 2025.  Sleep - averaging 8 hours/night  Non smoker  Alcohol - 2  glasses wine nightly  Dentist q6 mo  Eye exam yearly - cataracts removed.  Bowel - no constipation  Bladder - no incontinence   Married/remarried Husband with dx creutzfeldt-jakob prion disease 02/19/16 then memory unit of nursing home - passed away 02-19-2016 Lives alone. One daughter Kristine, local Occupation: retired, previously enjoyed teaching 2 art classes/wk  Activity: [facility] regularly [facility] Diet: good water, fruits/vegetables daily     Relevant past medical, surgical, family and social history reviewed and updated as indicated. Interim medical history since our last visit reviewed. Allergies and medications reviewed and updated. Outpatient Medications Prior to Visit  Medication Sig Dispense Refill    aspirin  EC 81 MG tablet Take 1 tablet (81 mg total) by mouth daily.     b complex vitamins tablet Take 1 tablet by mouth daily.     cholecalciferol (VITAMIN D ) 1000 UNITS tablet Take 1,000 Units by mouth daily.     Multiple Vitamin (MULTIVITAMIN) tablet Take 1 tablet by mouth daily.     omeprazole (PRILOSEC OTC) 20 MG tablet Take 20 mg by mouth as needed.     carvedilol  (COREG ) 25 MG tablet Take 1 tablet (25 mg total) by mouth 2 (two) times daily. 180 tablet 0   hydrochlorothiazide  (MICROZIDE ) 12.5 MG capsule Take 1 capsule (12.5 mg total) by mouth daily. 90 capsule 0   valsartan  (DIOVAN ) 320 MG tablet TAKE 1 TABLET EVERY DAY 90 tablet 0   amLODipine  (NORVASC ) 5 MG tablet TAKE 1 TABLET AT BEDTIME (Patient not taking: Reported on 01/19/2024) 90 tablet 3   benzonatate  (TESSALON ) 100 MG capsule Take 1 capsule (100 mg total) by mouth 2 (two) times daily as needed for cough. (Patient not taking: Reported on 01/19/2024) 30 capsule 0   ibuprofen (ADVIL,MOTRIN) 200 MG tablet Take 200 mg by mouth every 6 (six) hours as needed. (Patient not taking: Reported on 01/19/2024)     No facility-administered medications prior to visit.     Per HPI unless specifically indicated in ROS section below Review of Systems  Objective:  BP 126/70 (Cuff Size: Normal)   Pulse 96   Temp 97.9 F (36.6 C) (Oral)   Ht 5' 5 (1.651 m)   Wt 156 lb 9.6 oz (71 kg)   SpO2 96%   BMI 26.06 kg/m   Wt Readings from Last 3 Encounters:  01/19/24 156 lb 9.6 oz (71 kg)  10/07/23 159 lb 6 oz (72.3 kg)  09/30/23 164 lb (74.4 kg)      Physical Exam    Results for orders placed or performed in visit on 09/23/22  D-dimer, quantitative   Collection Time: 09/23/22  9:18 AM  Result Value Ref Range   D-Dimer, Quant 1.19 (H) <0.50 mcg/mL FEU    Assessment & Plan:   Problem List Items Addressed This Visit     Advanced care planning/counseling discussion - Primary (Chronic)   Previusly discussed.       HLD (hyperlipidemia)    Relevant Medications   carvedilol  (COREG ) 25 MG tablet   hydrochlorothiazide  (MICROZIDE ) 12.5 MG capsule   valsartan  (DIOVAN ) 320 MG tablet   Other Relevant Orders   Lipid panel   Comprehensive metabolic panel with GFR   Essential hypertension   Relevant Medications   carvedilol  (COREG ) 25 MG tablet   hydrochlorothiazide  (MICROZIDE ) 12.5 MG capsule   valsartan  (DIOVAN ) 320 MG tablet   Renal insufficiency   Relevant Orders   Comprehensive metabolic panel with GFR   CBC  with Differential/Platelet   VITAMIN D  25 Hydroxy (Vit-D Deficiency, Fractures)   Vitamin D  deficiency   Update vit D levels on daily 1000 international units  replacement      Relevant Orders   VITAMIN D  25 Hydroxy (Vit-D Deficiency, Fractures)   Other Visit Diagnoses       Encounter for immunization       Relevant Orders   Flu vaccine HIGH DOSE PF(Fluzone Trivalent) (Completed)     Need for vaccination against Streptococcus pneumoniae       Relevant Orders   Pneumococcal conjugate vaccine 20-valent (Prevnar 20) (Completed)        Meds ordered this encounter  Medications   carvedilol  (COREG ) 25 MG tablet    Sig: Take 1 tablet (25 mg total) by mouth 2 (two) times daily.    Dispense:  180 tablet    Refill:  3   hydrochlorothiazide  (MICROZIDE ) 12.5 MG capsule    Sig: Take 1 capsule (12.5 mg total) by mouth daily.    Dispense:  90 capsule    Refill:  3   valsartan  (DIOVAN ) 320 MG tablet    Sig: Take 1 tablet (320 mg total) by mouth daily.    Dispense:  90 tablet    Refill:  3    Orders Placed This Encounter  Procedures   Flu vaccine HIGH DOSE PF(Fluzone Trivalent)   Pneumococcal conjugate vaccine 20-valent (Prevnar 20)   Lipid panel   Comprehensive metabolic panel with GFR   CBC with Differential/Platelet   VITAMIN D  25 Hydroxy (Vit-D Deficiency, Fractures)    Patient Instructions  Labs today Flu shot and prevnar-20 pneumonia shot today  BP medicines refilled today.  You are doing well  today Try exercises provided today.  Return as needed or in 1 year for next wellness visit   Follow up plan: Return in about 1 year (around 01/18/2025) for medicare wellness visit, follow up visit.  Anton Blas, MD

## 2024-01-19 NOTE — Assessment & Plan Note (Signed)
 Update vit D levels on daily 1000 international units  replacement

## 2024-01-25 ENCOUNTER — Ambulatory Visit: Payer: Self-pay | Admitting: Family Medicine

## 2024-06-03 ENCOUNTER — Encounter (INDEPENDENT_AMBULATORY_CARE_PROVIDER_SITE_OTHER): Admitting: Ophthalmology

## 2024-09-30 ENCOUNTER — Ambulatory Visit

## 2025-01-13 ENCOUNTER — Other Ambulatory Visit

## 2025-01-20 ENCOUNTER — Encounter: Admitting: Family Medicine
